# Patient Record
Sex: Male | Born: 1965 | Race: White | Hispanic: No | Marital: Married | Smoking: Never smoker
Health system: Southern US, Community
[De-identification: ages and names within clinical notes are randomized; demographics above are authoritative.]

## PROBLEM LIST (undated history)

## (undated) DIAGNOSIS — K5732 Diverticulitis of large intestine without perforation or abscess without bleeding: Secondary | ICD-10-CM

## (undated) DIAGNOSIS — M5410 Radiculopathy, site unspecified: Secondary | ICD-10-CM

## (undated) DIAGNOSIS — I10 Essential (primary) hypertension: Secondary | ICD-10-CM

## (undated) DIAGNOSIS — M5412 Radiculopathy, cervical region: Secondary | ICD-10-CM

## (undated) DIAGNOSIS — K2970 Gastritis, unspecified, without bleeding: Secondary | ICD-10-CM

## (undated) DIAGNOSIS — E785 Hyperlipidemia, unspecified: Secondary | ICD-10-CM

## (undated) DIAGNOSIS — M546 Pain in thoracic spine: Secondary | ICD-10-CM

## (undated) DIAGNOSIS — IMO0001 Reserved for inherently not codable concepts without codable children: Secondary | ICD-10-CM

## (undated) DIAGNOSIS — R002 Palpitations: Secondary | ICD-10-CM

## (undated) DIAGNOSIS — I4430 Unspecified atrioventricular block: Secondary | ICD-10-CM

## (undated) DIAGNOSIS — G4730 Sleep apnea, unspecified: Secondary | ICD-10-CM

## (undated) DIAGNOSIS — E119 Type 2 diabetes mellitus without complications: Secondary | ICD-10-CM

## (undated) HISTORY — DX: Radiculopathy, cervical region: M54.12

## (undated) HISTORY — DX: Gastritis, unspecified, without bleeding: K29.70

## (undated) HISTORY — DX: Morbid (severe) obesity due to excess calories: E66.01

## (undated) HISTORY — DX: Pain in thoracic spine: M54.6

## (undated) HISTORY — PX: CERVICAL DISCECTOMY: SHX98

## (undated) HISTORY — DX: Reserved for inherently not codable concepts without codable children: IMO0001

## (undated) HISTORY — DX: Essential (primary) hypertension: I10

## (undated) HISTORY — DX: Diverticulitis of large intestine without perforation or abscess without bleeding: K57.32

## (undated) HISTORY — DX: Palpitations: R00.2

## (undated) HISTORY — DX: Radiculopathy, site unspecified: M54.10

## (undated) HISTORY — DX: Unspecified atrioventricular block: I44.30

## (undated) HISTORY — DX: Sleep apnea, unspecified: G47.30

## (undated) HISTORY — DX: Hyperlipidemia, unspecified: E78.5

---

## 2002-09-23 DIAGNOSIS — IMO0001 Reserved for inherently not codable concepts without codable children: Secondary | ICD-10-CM

## 2002-09-23 HISTORY — DX: Reserved for inherently not codable concepts without codable children: IMO0001

## 2002-11-16 ENCOUNTER — Inpatient Hospital Stay (HOSPITAL_COMMUNITY): Admission: EM | Admit: 2002-11-16 | Discharge: 2002-11-18 | Payer: Self-pay | Admitting: *Deleted

## 2002-11-16 ENCOUNTER — Encounter: Payer: Self-pay | Admitting: Emergency Medicine

## 2002-11-17 ENCOUNTER — Encounter: Payer: Self-pay | Admitting: Cardiology

## 2002-11-17 HISTORY — PX: CARDIAC CATHETERIZATION: SHX172

## 2002-11-18 ENCOUNTER — Encounter: Payer: Self-pay | Admitting: Cardiology

## 2005-12-06 ENCOUNTER — Encounter: Admission: RE | Admit: 2005-12-06 | Discharge: 2005-12-06 | Payer: Self-pay | Admitting: Gastroenterology

## 2005-12-10 ENCOUNTER — Encounter: Admission: RE | Admit: 2005-12-10 | Discharge: 2005-12-10 | Payer: Self-pay | Admitting: Gastroenterology

## 2006-02-04 ENCOUNTER — Emergency Department (HOSPITAL_COMMUNITY): Admission: EM | Admit: 2006-02-04 | Discharge: 2006-02-04 | Payer: Self-pay | Admitting: Emergency Medicine

## 2006-04-15 ENCOUNTER — Ambulatory Visit (HOSPITAL_COMMUNITY): Admission: RE | Admit: 2006-04-15 | Discharge: 2006-04-15 | Payer: Self-pay | Admitting: Neurology

## 2006-04-27 ENCOUNTER — Ambulatory Visit (HOSPITAL_BASED_OUTPATIENT_CLINIC_OR_DEPARTMENT_OTHER): Admission: RE | Admit: 2006-04-27 | Discharge: 2006-04-27 | Payer: Self-pay | Admitting: Neurology

## 2006-05-04 ENCOUNTER — Ambulatory Visit: Payer: Self-pay | Admitting: Internal Medicine

## 2006-05-30 ENCOUNTER — Ambulatory Visit: Payer: Self-pay | Admitting: Pulmonary Disease

## 2006-06-04 ENCOUNTER — Ambulatory Visit: Payer: Self-pay | Admitting: Pulmonary Disease

## 2006-06-04 ENCOUNTER — Ambulatory Visit (HOSPITAL_BASED_OUTPATIENT_CLINIC_OR_DEPARTMENT_OTHER): Admission: RE | Admit: 2006-06-04 | Discharge: 2006-06-04 | Payer: Self-pay | Admitting: Pulmonary Disease

## 2006-07-11 ENCOUNTER — Encounter: Payer: Self-pay | Admitting: Vascular Surgery

## 2006-07-11 ENCOUNTER — Ambulatory Visit (HOSPITAL_COMMUNITY): Admission: RE | Admit: 2006-07-11 | Discharge: 2006-07-11 | Payer: Self-pay | Admitting: Pulmonary Disease

## 2006-07-11 ENCOUNTER — Ambulatory Visit: Payer: Self-pay | Admitting: Pulmonary Disease

## 2009-05-19 ENCOUNTER — Encounter: Payer: Self-pay | Admitting: Pulmonary Disease

## 2009-05-19 DIAGNOSIS — J330 Polyp of nasal cavity: Secondary | ICD-10-CM | POA: Insufficient documentation

## 2009-05-19 DIAGNOSIS — G510 Bell's palsy: Secondary | ICD-10-CM | POA: Insufficient documentation

## 2009-05-19 DIAGNOSIS — G4733 Obstructive sleep apnea (adult) (pediatric): Secondary | ICD-10-CM | POA: Insufficient documentation

## 2009-05-19 DIAGNOSIS — Z87898 Personal history of other specified conditions: Secondary | ICD-10-CM | POA: Insufficient documentation

## 2009-05-30 ENCOUNTER — Encounter: Admission: RE | Admit: 2009-05-30 | Discharge: 2009-05-30 | Payer: Self-pay | Admitting: Cardiology

## 2009-06-06 ENCOUNTER — Ambulatory Visit: Payer: Self-pay | Admitting: Pulmonary Disease

## 2009-06-21 ENCOUNTER — Encounter: Payer: Self-pay | Admitting: Pulmonary Disease

## 2009-10-31 ENCOUNTER — Encounter: Admission: RE | Admit: 2009-10-31 | Discharge: 2009-10-31 | Payer: Self-pay | Admitting: Gastroenterology

## 2010-10-23 NOTE — Miscellaneous (Signed)
Summary: CPAP download  Clinical Lists Changes CPAP 8 cm.  AHI 4.  Average 7hrs per night with 100% compliance.  Will have my nurse call to inform patient that CPAP download looked good, and no change to set up needed.  Appended Document: CPAP download LMTCB.  Appended Document: CPAP download Pt is aware of CPAP download and that no change is being made to his current set up.

## 2010-10-23 NOTE — Assessment & Plan Note (Signed)
Summary: rov//mbw   Primary Provider/Referring Provider:  C. Duane Lope  CC:  OSA follow-up.   Pt says he is still doing well on CPAP. Last seen in 2007.Marland Kitchen  History of Present Illness: I saw [name] in follow up for his OSA on CPAP 8 cm.  I last saw Mr. Garate in 2007.  He has a full face mask with humidifer.  He goes to bed at 1am, and wakes up at 8 am.  He is not having any problems with his mask.  He has not had any new equipment since his original set up in 2007.  He does not recall which DME company he used.  He does not snore when he uses CPAP.  He feels better energy during the day when he uses his CPAP.  He has been exercising, and try to watch his diet.  Current Medications (verified): 1)  Minocycline Hcl 100 Mg Tabs (Minocycline Hcl) .Marland Kitchen.. 1 By Mouth Two Times A Day 2)  Kapidex .Marland KitchenMarland Kitchen. 1 By Mouth Daily  Allergies (verified): 1)  ! Pcn 2)  ! Hydrocodone  Past History:  Past Medical History: Current Problems:  Complex sleep apnea      - PSG 04/27/06 AHI 73      - CPAP 8 cm H2O Bell's palsy Migraine headaches Nasal polyps GERD  Vital Signs:  Patient profile:   45 year old male Height:      68 inches (172.72 cm) Weight:      270 pounds (122.73 kg) BMI:     41.20 O2 Sat:      98 % on Room air Temp:     97.9 degrees F (36.61 degrees C) oral Pulse rate:   63 / minute BP sitting:   130 / 80  (right arm) Cuff size:   regular  Vitals Entered By: Michel Bickers CMA (June 06, 2009 11:56 AM)  O2 Flow:  Room air  Physical Exam  General:  healthy appearing and obese.   Nose:  no deformity, discharge, inflammation, or lesions Mouth:  MP 4, 2+ tonsills, enlarged tongue Neck:  no JVD.   Lungs:  clear bilaterally to auscultation and percussion Heart:  regular rate and rhythm, S1, S2 without murmurs, rubs, gallops, or clicks Abdomen:  bowel sounds positive; abdomen soft and non-tender without masses, or organomegaly Extremities:  no clubbing, cyanosis, edema, or  deformity noted Cervical Nodes:  no significant adenopathy   Impression & Recommendations:  Problem # 1:  OBSTRUCTIVE SLEEP APNEA (ICD-327.23) He has done well with CPAP.  I will re-establish him with a DME company to get new supplies.  I will also get a copy of his CPAP download.  I have encouraged him to continue his weight loss regimen.  Medications Added to Medication List This Visit: 1)  Minocycline Hcl 100 Mg Tabs (Minocycline hcl) .Marland Kitchen.. 1 by mouth two times a day 2)  Kapidex  .Marland KitchenMarland Kitchen. 1 by mouth daily  Complete Medication List: 1)  Minocycline Hcl 100 Mg Tabs (Minocycline hcl) .Marland Kitchen.. 1 by mouth two times a day 2)  Kapidex  .Marland KitchenMarland Kitchen. 1 by mouth daily  Other Orders: Est. Patient Level III (29562) DME Referral (DME)  Patient Instructions: 1)  Follow up in 6 months

## 2011-02-08 NOTE — Procedures (Signed)
NAME:  Robert Schultz, Robert Schultz NO.:  [id number]   MEDICAL RECORD NO.:  [id number]          PATIENT TYPE:  OUT   LOCATION:  SLEEP CENTER                 FACILITY[facility]  [facility]   PHYSICIAN:  Clinton D. Maple Hudson, M.D. DATE OF BIRTH:  1966-02-05   DATE OF STUDY:  04/27/2006                              NOCTURNAL POLYSOMNOGRAM   REFERRING PHYSICIAN:  Rene Kocher, M.D.   DATE OF STUDY:  April 27, 2006.   INDICATION FOR STUDY:  Hypersomnia with sleep apnea.   EPWORTH SLEEPINESS SCORE:  9/24, BMI 37.8, weight 250 pounds.   MEDICATIONS:  Minocycline, verapamil.   Complained of persistent dizziness.  A split protocol study was requested.   SLEEP ARCHITECTURE:  Short total sleep time 217 minutes, with sleep  efficiency 51%.  Stage I was 11%, stage II 85%, stages III and IV 1%, REM 4%  of total sleep time.  Sleep latency 71 minutes, REM latency 248 minutes,  awake after sleep onset 141 minutes.  Lights out at 10:08 p.m., sleep onset  11:19 p.m.Marland Kitchen  There were protracted intervals of wakefulness during the  night.  No bedtime medication was reported.  The patient described sleep as  short, fragmented, and worse than usual, without citing a specific source of  discomfort.   RESPIRATORY DATA:  Apnea/hypopnea index (AHI, RDI) 72.8 obstructive events  per hour, indicating severe obstructive sleep apnea/hypopnea syndrome.  There were 108 central apneas, 80 obstructive apneas, and 76 hypopneas.  Events were not positional.  Position changes were frequent.  REM AHI 112.5  per hour.  There was insufficient sustained sleep at the beginning of the  night to permit time for CPAP titration by split protocol on this study  night.   OXYGEN DATA:  Very loud snoring, with oxygen desaturation to a nadir of 85%.  Mean oxygen saturation through the study was 95% on room air.   CARDIAC DATA:  Normal sinus rhythm.   MOVEMENT-PARASOMNIA:  A total of 83 limb jerks were recorded, of which only  4 were  associated with arousal or awakening, for periodic limb movement with  arousal index of 1.1 per hour, which is probably not significant.  Bathroom  x3 is noteworthy, especially for a male of this age.   IMPRESSION-RECOMMENDATIONS:  1. Short sleep time, with sustained intervals of wakefulness during the      night.  Correlate with home sleep pattern, although the patient says      this was worse than usual.  2. Severe central and obstructive sleep apnea/hypopnea syndrome, AHI 72.8      respiratory events per hour.  While some central events may have      actually been scorable as obstructive, frequency of central events in      this range raises the possibility of an abnormal feedback loop as is      seen with cardiac or cerebrovascular perfusion disorders.  3. The initial therapeutic recommendation would be to treat the      obstructive apnea component which may also improve the central apnea      component.  Suggest that the patient return for CPAP titration if  otherwise clinically appropriate, possibly bringing a sleep medication      to aid in sleep consolidation for that purpose.  4. Nocturia x3 would be consistent with obstructive sleep apnea and a      variety of other lifestyle and medical disturbances, but would be      otherwise unusual if it is typical of this 45 year old individual.  5. Feel free to contact this interpreter if needed.      Clinton D. Maple Hudson, M.D.  Diplomate, Biomedical engineer of Sleep Medicine  Electronically Signed     CDY/MEDQ  D:  05/04/2006 09:30:12  T:  05/05/2006 09:46:13  Job:  [id number]

## 2011-02-08 NOTE — Discharge Summary (Signed)
NAME:  Robert Schultz, Robert Schultz NO.:  [id number]   MEDICAL RECORD NO.:  [id number]                   PATIENT TYPE:  INP   LOCATION:  3707                                 FACILITY:  [facility]   PHYSICIAN:  Colleen Can. Deborah Chalk, M.D.            DATE OF BIRTH:  Aug 27, 1966   DATE OF ADMISSION:  11/16/2002  DATE OF DISCHARGE:  11/18/2002                                 DISCHARGE SUMMARY   PRIMARY DIAGNOSES:  1. AV block with bradycardia in the setting of nausea and diaphoresis with     subsequent elective cardiac catheterization revealing normal coronary     arteries and normal LV function.  2. Obesity.   HISTORY OF PRESENT ILLNESS:  The patient is a 45 year old married white male  who presented to the hospital on 11/16/02.  On the day of admission, at  lunch, he basically felt somewhat full but then developed dizziness.  He  felt somewhat sleepy with mild nausea and decided to go home.  At home, he  slept for about 10-15 minutes on the couch but then felt lightheaded after  urination.  His stomach continued to feel full and he had a diaphoretic  sensation with dizziness and mild shortness of breath.  There was no real  chest pain syndrome.  He repeated this sequence over the next 3-4 hours with  at least 2-3 more episodes.  His wife came home and advised him to seek  further evaluation, so he presented to the emergency room where he had  another episode that was associated with profound bradycardia and AV block.  There was no abnormal ST elevation on EKG with no chest pain.  He was  subsequently seen and evaluated by Dr. Roger Shelter and admitted to  telemetry.   Please see dictated History and Physical for further patient presentation  and profile.   LABORATORY DATA:  EKG showed sinus rhythm with AV block.  Chest x-ray showed  mild elevation of the right hemidiaphragm, otherwise, unremarkable.  CBC  showed white count 9, hemoglobin 16, hematocrit 45, platelets  183,000.  PT/PTT were unremarkable.  Chemistries were satisfactory except for glucose  of 123, ALT 51.  Cardiac enzymes were negative x5.  TSH was normal at 1.785.  Urinalysis was unremarkable.   PROCEDURE:  1. Cardiac catheterization with results as noted above.  2. Abdominal ultrasound with probable diffuse fatty infiltration of the     liver with normal sonographic appearance of the gallbladder and normal     caliber common bile duct.  There was nonvisualization of the pancreas.  3. Hepatobiliary scan which was normal.   HOSPITAL COURSE:  The patient was admitted to telemetry.  We proceeded on  with cardiac catheterization to rule out coronary disease as the etiology of  his profound bradycardia and AV block.  Those results are as noted above.  He subsequently had GI consultation carried out by Dr.  Boyd Kerbs.  It was  felt that the differential diagnosis consisted of acalculous cholecystitis,  pancreatitis or an acute viral gastroenteritis.  The patient was placed on  Protonix.  Hepatobiliary scan was performed after abdominal ultrasound.  Those results are as noted above.  The patient was subsequently discharged  following hepatobiliary scan with further evaluation to occur on an  outpatient basis.   CONDITION ON DISCHARGE:  Stable.   DISCHARGE MEDICATIONS:  Protonix 40 mg daily.   FOLLOWUP:  Our office will call for a follow up time for a follow up  appointment.  He is not to drive until seen back following 2D echocardiogram  results.  He is to call if any problems would occur in the interim.     Juanell Fairly C. Earl Gala, N.P.                 Colleen Can. Deborah Chalk, M.D.    LCO/MEDQ  D:  12/16/2002  T:  12/17/2002  Job:  [id number]   cc:   Tasia Catchings, M.D.  [address]  [address]  [place]  [place] [address]  Fax: [phone]   Dellis Anes. Idell Pickles, M.D.  [address]  [place]  [place] [address]  Fax: [phone]

## 2011-02-08 NOTE — H&P (Signed)
NAME:  Robert Schultz, CALLOW NO.:  [id number]   MEDICAL RECORD NO.:  [id number]                   PATIENT TYPE:  EMS   LOCATION:  MAJO                                 FACILITY:  [facility]   PHYSICIAN:  Colleen Can. Deborah Chalk, M.D.            DATE OF BIRTH:  [birth year]   DATE OF ADMISSION:  11/16/2002  DATE OF DISCHARGE:                                HISTORY & PHYSICAL   HISTORY OF PRESENT ILLNESS:  Mr. Kossman is a 45 year old married white male  with no children, no alcohol, no tobacco, who works in Teaching laboratory technician.   HISTORY OF PRESENT ILLNESS:  Today at lunch he basically felt full, but then  he developed a dizziness.  He was somewhat sleepy and felt mild nausea and  decided to go home.  This was approximately 2 p.m. today.  At home, he was  tired, slept 15-20 minutes on the couch, but then he felt lightheaded after  urination.  His stomach still felt full, and he began to have a diaphoretic  sensation with dizziness, mild shortness of breath.  There was no chest  pain.  He had a sweat and felt better.  He repeated this sequence over the  next 3-4 hours at least 2-3 more times.  When his wife came home, he was  brought to the emergency room.  He had another episode in the emergency room  associated with profound bradycardia and AV block.  There was no chest pain  associated with that, no ST elevation noted with that.   PAST MEDICAL HISTORY:  He has generally been in good health.   PAST SURGICAL HISTORY:  No surgeries.   ALLERGIES:  No allergies.   MEDICATIONS:  1. Minocycline for acne.  2. He takes a nasal spray for congestion.  3. Over-the-counter Sudafed.  4. Ibuprofen.   FAMILY HISTORY:  Father is alive at age 80 in good health.  Mother is alive  at age 93.  She has MS.  One brother and one sister are alive and well.   REVIEW OF SYSTEMS:  His eyelids twitch on the right.  He has had no tick  bite, no fever, no rash, although his  right ankle has been a little bit red;  it is clearly not a rash currently.  PULMONARY:  Negative.  GI:  Negative.  GU:  Negative.  NEUROLOGIC:  He has had syncope as a child after taking cold  medicine 7-8 years ago.  He had a vague chest pain, was dehydrated, went to  [facility], and it was felt to be secondary to dehydration.  He has not had  recurrence since then.   PHYSICAL EXAMINATION:  GENERAL:  He is a pleasant obese white male.  He has  a ruddy complexion.  VITAL SIGNS:  Blood pressure 138/72, heart rate in the 90s to 100s, but has  had on telemetry  down as low as 35.  HEENT:  Negative.  LUNGS:  Clear.  HEART:  Regular rate and rhythm with some mild tachycardia.  ABDOMEN:  Obese.  EXTREMITIES:  Without edema.   His EKG shows sinus with an AV block.  There are no acute ST changes noted.   OVERALL IMPRESSION:  1. AV block with bradycardia.  2. Nausea and diaphoresis.   PLAN:  Will admit the patient to telemetry.  I think he needs to have a  cardiac catheterization, in particular to rule out an ischemic basis of all  this heart block.  Will arrange for IV heparin and Nitrol paste.                                               Colleen Can. Deborah Chalk, M.D.    SNT/MEDQ  D:  11/16/2002  T:  11/17/2002  Job:  [id number]

## 2011-02-08 NOTE — Assessment & Plan Note (Signed)
[facility]                               PULMONARY OFFICE NOTE   NAME:JOHNSONEdiel, Unangst                       MRN:          [id number]  DATE:07/11/2006                            DOB:          Dec 28, 1965    I reviewed the results of the Doppler ultrasound of the right leg for Mr.  Degrace and this was essentially negative for DVT, superficial thrombosis or  Baker's cyst, and therefore I would recommend just clinical monitoring of  his symptoms.       Coralyn Helling, MD      VS/MedQ  DD:  07/15/2006  DT:  07/16/2006  Job #:  [id number]   cc:   Rene Kocher, M.D.

## 2011-02-08 NOTE — Assessment & Plan Note (Signed)
[facility]                               PULMONARY OFFICE NOTE   NAME:JOHNSONZhion, Pevehouse                       MRN:          [id number]  DATE:07/11/2006                            DOB:          28-May-1966    I saw Mr. Menter in followup today for his obstructive sleep apnea.  He is  currently on CPAP at 8 and he appears to be doing reasonably well.  He says  that he is able to sleep much better through the night.  His wife does not  hear him snoring any more.  He says it was a little bit difficult at first  to get used to using the machine, and he has had difficulty as far as  adjusting to the mask fitting over the bridge of his nose.  Otherwise, he  says he is feeling like he has more energy during the day and has had  several people comment with regards to that.  His other concern is that he  had recently returned from a plane flight to [place] and, after this, he was  developing pain and swelling in his right ankle.  He says that the swelling  has improved somewhat, but it still present.  He, however, denies any  symptoms of shortness of breath, chest pain, palpitations, coughing,  wheezing, dizziness, or nausea.   EXAM:  He had very minimal swelling around the right ankle area, but there  is no tenderness, erythema, and there are no visible or palpable vessels.   IMPRESSION:  1. Obstructive sleep apnea, currently doing well on continuous positive      airway pressure of 8 cm of water.  I have advised him to contact his      home care company to see if he can try an alternative mask to see if      this improves his tolerance of continuous positive airway pressure      therapy.  Otherwise, I have encouraged him to maintain his diet,      exercise, and weight reduction program.  Of note, also, is that he      informs me that he had a recent cardiology evaluation and, as per the      patient, this was essentially normal.  2. Minimal ankle swelling  with recent plane flight.  The patient had      stated that he had done some investigation on the Internet and was      concerned if he had developed a clot in his leg.  While certainly on      physical findings this is less likely, to further evaluate this, I have      made arrangements for him to refer to [facility] radiology department      for a Doppler study of his right leg to evaluate for the possibility of      a deep vein thrombosis.  I will call him with the results of this once      they are available and I have advised him that, if these results are  positive, then he would need to be initiated on anticoagulation      therapy.  Otherwise, I will      plan on following up with him in approximately 4 months for further      assessment of the status of his obstructive sleep apnea.       Coralyn Helling, MD   VS/MedQ DD:  07/14/2006 DT:  07/15/2006 Job #:  [id number]   cc:   Rene Kocher, M.D.

## 2011-02-08 NOTE — Cardiovascular Report (Signed)
NAME:  Robert Schultz, Robert Schultz NO.:  [id number]   MEDICAL RECORD NO.:  [id number]                   PATIENT TYPE:  INP   LOCATION:  3707                                 FACILITY:  [facility]   PHYSICIAN:  Colleen Can. Deborah Chalk, M.D.            DATE OF BIRTH:  [birth year]   DATE OF PROCEDURE:  11/17/2002  DATE OF DISCHARGE:                              CARDIAC CATHETERIZATION   HISTORY:  The patient presents with intermittent high-grade AV block with  profound bradycardia with diaphoresis, shortness of breath, and nausea.  He  is referred for catheterization.  Myocardial infarction had been ruled out.   PROCEDURES:  Left heart catheterization with selective coronary angiography,  left ventricular angiography.   TYPE AND SITE OF ENTRY:  Percutaneous right femoral artery.   CATHETERS:  A 6 French 4 curved Judkins left coronary catheters, 6 French  pigtail ventriculographic catheter, Perclose device.   MEDICATIONS GIVEN PRIOR TO THE PROCEDURE:  Valium 10 mg p.o.   MEDICATIONS GIVEN DURING THE PROCEDURE:  Versed 2 mg IV.   COMMENTS:  The patient tolerated the procedure well.   HEMODYNAMIC DATA:  The aortic pressure was 111/81, LV was 112/11-17.  There  was no aortic valve gradient noted on pullback.   LEFT VENTRICULOGRAPHY:  The left ventricular angiogram was performed in the  RAO position.  Overall cardiac size and silhouette are normal.  The global  left ventricular function is normal with ejection fraction of 65%.  Regional  wall motion is normal.   CORONARY ARTERIES:  Coronary arteries arise and distribute normally.  1. Left main coronary artery:  Normal.  2. Left anterior descending:  The left anterior descending is a reasonably     large system that extends to around the apex.  There is a high proximal     diagonal that is relatively large.  The left anterior descending system     is normal.  3. Left circumflex:  The left circumflex continues as a  posterolateral     branch that trifurcates.  There are smaller proximal marginal vessels.     There are minor irregularities but essentially the arteries are normal.  4. Right coronary artery:  The right coronary artery is a dominant vessel.     It has a large posterior descending.  It is normal.   OVERALL IMPRESSION:  1. Normal left ventricular function.  2. Normal coronary arteries.    DISCUSSION:  It does not appear that coronary artery disease is the etiology  of this intermittent nausea with complete heart block.  His gallbladder  ultrasound is basically unremarkable.  At his age of 55 we will review other  etiologies before considering pacing.  Colleen Can. Deborah Chalk, M.D.    SNT/MEDQ  D:  11/17/2002  T:  11/17/2002  Job:  [id number]

## 2011-02-08 NOTE — Procedures (Signed)
EEG No. [id number].   CLINICAL HISTORY:  The patient is a 45 year old right-handed gentleman who  had episodes of dizziness 20-30 times a day lasting seconds.  This began in  March 2007.  Has occasional staring spells lasting 3-4 seconds.  EEG is done  to look for the presence of seizures, 780.02.   PROCEDURE:  The tracing is carried out on a 32-channel digital Cadwell  recorder reformatted into 16 channel montages with one devoted to EKG.  The  patient was awake during the recording.  The International 10/20 system of  lead placement used.  Medications include Avelox and minocycline.   DESCRIPTION OF FINDINGS:  Dominant frequency 10 Hz, 15 microvolt activity  that is well-regulated.  Background activity is a 10-15 Hz upper theta range  activity with frontally predominant under 10 microvolt beta range  components.   Hyperventilation caused no significant change.  Photic stimulation induced  driving responses at 7, 9 and 11 Hz.  There was no focal slowing.  There was  no interictal epileptiform activity form of spikes or sharp waves.   EKG showed a regular sinus rhythm with ventricular response of 66 beats per  minute.   IMPRESSION:  Essentially normal record awake (low voltage background), no  sign of seizures.      Deanna Artis. Sharene Skeans, M.D.  Electronically Signed     ZOX:WRUE  D:  04/15/2006 19:00:09  T:  04/16/2006 06:52:34  Job #:  [id number]   cc:   Rene Kocher, M.D.  Fax: [phone]

## 2011-02-08 NOTE — Consult Note (Signed)
NAME:  Robert Schultz, Robert Schultz NO.:  [id number]   MEDICAL RECORD NO.:  [id number]                   PATIENT TYPE:  INP   LOCATION:  3707                                 FACILITY:  [facility]   PHYSICIAN:  Tasia Catchings, M.D.                DATE OF BIRTH:  1965-10-28   DATE OF CONSULTATION:  11/17/2002  DATE OF DISCHARGE:                                   CONSULTATION   REASON FOR CONSULTATION:  The patient is a 45 year old male admitted  yesterday with an illness characterized by nausea, some mild epigastric and  right upper quadrant pain, lightheadedness, chills, fever, and during his  episodes of nausea second degree AV block.  His underlying history that he  has been pretty good health and really on no medications.  However, he is  excessively overweight.  For that reason, when he was admitted and found to  have an AV block, he was first sent to Dr. Colleen Can. Deborah Chalk who quite  reasonably ruled out a MI and then performed a cardiac catheterization which  was normal.   Since all of those tests were normal and it appears that his arrhythmia, at  least in part, might be due to vasovagal reaction.  I was called to see him.   He still complains of a little bit of epigastric fullness, although it has  been better since he vomited.  He apparently vomited several times  throughout the night, mostly what he had eaten at noon yesterday and then  finally some bile and has not vomited since about 8 a.m. this morning.  However, his normal good appetite has not returned and he is still feeling  queasy.   He has no antecedent history of gastrointestinal problems and rarely does he  vomit.  He is not on any antiacid medications but does use occasional  ibuprofen for joint pains.   ALLERGIES:  None.   HABITS:  Smoking:  None.  Alcohol:  None.   CURRENT MEDICATIONS:  Occasional ibuprofen.   PAST SURGICAL HISTORY:  None.   PAST MEDICAL HISTORY:  He has a fractured  right wrist.   FAMILY HISTORY:  Father is alive age 54 with prostate cancer.  Mother is  alive at age 21 with multiple sclerosis.  He has one brother and one sister  who are in good health and no children.   SOCIAL HISTORY:  The patient is a [employer] [place] operational [employer] native.  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   PHYSICAL EXAMINATION:  GENERAL:  He is an obese male who appears somewhat  apprehensive.  ABDOMEN:  His abdomen is somewhat difficult to examine due to obesity but it  is quite tender on the right upper quadrant. I cannot feel liver or spleen.  Bowel sounds are normal.  There are no bruits but there was definite  tenderness in the right upper quadrant.   LABORATORY DATA:  A normal CBC and CMET.  Amylase and lipase have not been  obtained.   Abdominal ultrasound done today showed no gallstones and was essentially  normal but a limited study due to body habitus.  It did show a fatty liver.   IMPRESSION:  1. Abdominal pain, nausea, and vomiting of uncertain etiology.  Acalculous     cholecystitis, pancreatitis, and acute viral gastrointestinal are all     possible.  2. 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   RECOMMENDATIONS:  1. Agree with Protonix which was started today.  2. I have added an amylase and lipase tonight's blood.  3. Hepatobiliary scan in the morning.                                               Tasia Catchings, M.D.    JW/MEDQ  D:  11/17/2002  T:  11/17/2002  Job:  [id number]   cc:   Colleen Can. Deborah Chalk, M.D.  [address]  [place]  [place] [address]  Fax: [phone]

## 2011-02-08 NOTE — Assessment & Plan Note (Signed)
[facility]                               PULMONARY OFFICE NOTE   Robert Schultz, Robert Schultz                       MRN:          [id number]  DATE:05/30/2006                            DOB:          1966-04-05    REFERRING PHYSICIAN:  Rene Kocher, M.D.   I had the pleasure of meeting Robert Schultz today for evaluation of his sleep  difficulties.  He had undergone an overnight polysomnogram on April 27, 2006, and this showed evidence for severe mixed sleep apnea with both  central and obstructive events.  His overall apneahypopnea index was 72.8  with an oxygen saturation nadir of 85%.  He also had a slight increase in  his periodic limb movement index.  His total central apneahypopnea index was  29.8 and his total obstructive apneahypopnea index was 22.1.  He says that  he was originally referred for his sleep study because he has been having  symptoms of dizziness since March 2007.  He apparently had undergone  evaluation with an EEG and an MRI which were both normal, as well as an EKG  which was normal per the patient.  He says he is due to have a cardiology  evaluation within the next several weeks.  He has also been told that he  snores quite loudly and actually stops breathing while he is asleep.  He  tends to be more of a mouth-breather at night and he will wake up once or  twice during the night to use the bathroom.  He says that his typical sleep  pattern is he will go to bed between 11 and 1:30 in the morning.  He falls  asleep fairly quickly.  He wakes up once or twice during the night again to  use the bathroom and then he gets up between 7:30 and 8:30 in the morning.  He says he will feel tired at times in the morning and at times feel tired  during the day.  His Epworth score today is 8 out of 24.  He has fallen  asleep while watching TV at times, but denies having any difficulty with  feeling sleepy while driving.  There is no history of  nightmares, night  terrors, sleep walking, sleep talking or bruxism.  He also denies restless  leg  syndrome symptoms.  He denies symptoms of sleep hallucination, sleep  paralysis or cataplexy.  He is not currently using anything to help him fall  asleep at night or stay away during the day.   PAST MEDICAL HISTORY:  1. Possible migraines and he has maxillary sinus polyposis.  2. He also has a history of Bell palsy twice for which he was treated with      prednisone.   CURRENT MEDICATIONS:  1. Verapamil 120 mg daily for his migraines.  2. He is on Flonase 2 sprays in each nostril daily.   ALLERGIES:  HE HAS ALLERGIES TO HYDROCODONE WHICH CAUSED HIM TO DEVELOP  SWEATS AND PENICILLIN WHICH CAUSED HIM TO DEVELOP A RASH.   FAMILY HISTORY:  His father who  has hypertension, basal cell carcinoma and  possible sleep apnea.  His mother has multiple sclerosis.   SOCIAL HISTORY:  He is married.  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  There is no history of smoking or alcohol use.   REVIEW OF SYSTEMS:  He says he will clench his teeth at times at night.  He  has lost approximately 25 pounds over the last several months.  He says he  has been exercising by riding a bicycle as well as jogging.  Of note, is  that his father had noticed that his snoring was somewhat less with this  recent weight loss.   PHYSICAL EXAMINATION:  VITAL SIGNS:  He is 5 feet 8 inches tall, 256 pounds,  temperature 98.2, blood pressure 126/84, heart rate 74, oxygen saturation  97% on room air.  HEENT:  Pupils are reactive.  There is no sinus tenderness.  He has a narrow  nasal angle.  He has a Mallampati 4 airway with an enlarged tongue, 2+  tonsils and a triangular-shaped uvula with oropharyngeal crowding.  There is  no lymphadenopathy, no thyromegaly.  HEART:  S1, S2.  Regular rhythm.  CHEST:  Clear to auscultation.  ABDOMEN:  Obese, soft, nontender.  EXTREMITIES:  No edema, cyanosis or  clubbing.  NEUROLOGIC:  He was alert and oriented x3, 5/5 strength and no cerebellar  deficits were appreciated.   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  I reviewed the results of his sleep study with him.  I described the  adverse consequences of obstructive sleep apnea including increased risks of  hypertension, coronary artery disease, cerebrovascular disease and diabetes.  I had also discussed with him the implications of having central sleep apnea  as this may be associated with either cardiac disease or neurologic disease.  However, it appears that he has had a rather extensive evaluation per his  neurologic status which, according to the patient, is essentially negative,  and he is due to have further cardiac evaluation.  Again, the central apneic  event could also present a form of obstructive sleep apnea as well.  What I  have recommended for the patient at this time is to maintain his diet,  exercise and weight reduction program.  I have discussed driving precautions  with him until his sleep disorder is under adequate control.  I had reviewed  various treatment options for him with regards to his sleep apnea.  At this  time we feel his best option would be for him to initiate CPAP therapy.  Therefore, we will make arrangements for him to be referred back to sleep  lab for a CPAP titration study and then, depending upon his response to this  as well as review of the results of his sleep study, we will either initiate  him on CPAP therapy or consider other modes of therapy such as bilevel  positive airway pressure as well as possibly adaptive Servo ventilator.  Other measures that could be considered for his central sleep apnea include  supplemental oxygen as well as acetazolamide, but again this would be  determined after review of his CPAP titration study.  With regards to the increase in his periodic limb movement, I would want to see  his symptom  status after he his sleep apnea is adequately controlled, and then  assess whether any further intervention will be necessary for this.  I will  call him  with the results of his sleep study and plan on following up with  him in our office in about 6 to 8 weeks.                                  Coralyn Helling, MD   VS/MedQ  DD:  05/30/2006  DT:  05/31/2006  Job #:  [id number]   cc:   Rene Kocher, M.D.

## 2011-03-06 ENCOUNTER — Encounter: Payer: Self-pay | Admitting: Nurse Practitioner

## 2011-03-08 ENCOUNTER — Ambulatory Visit (INDEPENDENT_AMBULATORY_CARE_PROVIDER_SITE_OTHER): Payer: PRIVATE HEALTH INSURANCE | Admitting: Nurse Practitioner

## 2011-03-08 ENCOUNTER — Encounter: Payer: Self-pay | Admitting: Nurse Practitioner

## 2011-03-08 VITALS — BP 160/100 | HR 72 | Ht 68.0 in | Wt 263.8 lb

## 2011-03-08 DIAGNOSIS — E785 Hyperlipidemia, unspecified: Secondary | ICD-10-CM

## 2011-03-08 DIAGNOSIS — I10 Essential (primary) hypertension: Secondary | ICD-10-CM

## 2011-03-08 DIAGNOSIS — R002 Palpitations: Secondary | ICD-10-CM

## 2011-03-08 LAB — HEPATIC FUNCTION PANEL
ALT: 39 U/L (ref 0–53)
AST: 26 U/L (ref 0–37)
Albumin: 4.4 g/dL (ref 3.5–5.2)
Alkaline Phosphatase: 71 U/L (ref 39–117)
Bilirubin, Direct: 0.1 mg/dL (ref 0.0–0.3)
Total Bilirubin: 0.6 mg/dL (ref 0.3–1.2)
Total Protein: 7.7 g/dL (ref 6.0–8.3)

## 2011-03-08 LAB — BASIC METABOLIC PANEL
BUN: 16 mg/dL (ref 6–23)
CO2: 29 mEq/L (ref 19–32)
Calcium: 9.1 mg/dL (ref 8.4–10.5)
Chloride: 103 mEq/L (ref 96–112)
Creatinine, Ser: 0.8 mg/dL (ref 0.4–1.5)
GFR: 116.97 mL/min (ref >60.00)
Glucose, Bld: 127 mg/dL — ABNORMAL HIGH (ref 70–99)
Potassium: 4.4 mEq/L (ref 3.5–5.1)
Sodium: 138 mEq/L (ref 135–145)

## 2011-03-08 LAB — LIPID PANEL
Cholesterol: 223 mg/dL — ABNORMAL HIGH (ref 0–200)
HDL: 33.6 mg/dL — ABNORMAL LOW (ref >39.00)
Total CHOL/HDL Ratio: 7
Triglycerides: 230 mg/dL — ABNORMAL HIGH (ref 0.0–149.0)
VLDL: 46 mg/dL — ABNORMAL HIGH (ref 0.0–40.0)

## 2011-03-08 LAB — LDL CHOLESTEROL, DIRECT: Direct LDL: 163.6 mg/dL

## 2011-03-08 MED ORDER — LISINOPRIL 10 MG PO TABS: 10.0000 mg | ORAL_TABLET | Freq: Every day | ORAL | Status: DC

## 2011-03-08 MED ORDER — ASPIRIN EC 81 MG PO TBEC: 81.0000 mg | DELAYED_RELEASE_TABLET | Freq: Every day | ORAL | Status: DC

## 2011-03-08 NOTE — Assessment & Plan Note (Addendum)
Blood pressure is up. I suspect this has been going on for a while. He is on no medicines. I have started him on Lisinopril 10 mg along with a baby aspirin daily. Labs are checked today. I will see him back in 2 weeks. He is asked to obtain a blood pressure cuff at home and monitor. He is encouraged to continue with his efforts at diet and weight loss. He is down 5# since his last visit. Patient is agreeable to this plan and will call if any problems develop in the interim.   Phone call on June 20th. Not tolerating ACE. Has shortness of breath and fatigue. Feels bad. Will stop the ACE and try Norvasc 5 mg daily. He is to call for any other problems.

## 2011-03-08 NOTE — Assessment & Plan Note (Signed)
Labs will be checked today. 

## 2011-03-08 NOTE — Progress Notes (Signed)
    Robert Schultz Date of Birth: [birth year]   History of Present Illness: Robert Schultz is seen back today after an approximate 1 1/2 year absence. He is seen for Dr. Deborah Chalk. He will be followed by Dr. Mariah Milling. He feels good. No chest pain. No shortness of breath. He is on no medicines. He does not check his blood pressure at home. He is under a great deal of stress. 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 He is exercising without any problems.  Current Outpatient Prescriptions on File Prior to Visit  Medication Sig Dispense Refill  . DISCONTD: atenolol (TENORMIN) 25 MG tablet Take 12.5 mg by mouth 4 (four) times daily.          Allergies  Allergen Reactions  . Hydrocodone   . Penicillins     Past Medical History  Diagnosis Date  . Hyperlipidemia   . Morbid obesity   . Gastritis   . Palpitations   . AV block   . HTN (hypertension)     Past Surgical History  Procedure Date  . Cardiac catheterization 11/17/2002    NORMAL. EF 65%    History  Smoking status  . Never Smoker   Smokeless tobacco  . Never Used    History  Alcohol Use No    Family History  Problem Relation Age of Onset  . Multiple sclerosis Mother     Review of Systems: The review of systems is positive for situational stress. He has no complaint of palpitations and says this has gotten better with time. He is on no medicines.  All other systems were reviewed and are negative.  Physical Exam: BP 160/100  Pulse 72  Ht 5\' 8"  (1.727 m)  Wt 263 lb 12.8 oz (119.659 kg)  BMI 40.11 kg/m2 Patient is very pleasant and in no acute distress. He is obese. Skin is warm and dry. Color is normal.  HEENT is unremarkable. Normocephalic/atraumatic. PERRL. Sclera are nonicteric. Neck is supple. No masses. No JVD. Lungs are clear. Cardiac exam shows a regular rate and rhythm. Abdomen is soft. Extremities are without edema. Gait and ROM are intact. No gross neurologic deficits noted.  LABORATORY DATA: Pending  Assessment / Plan:

## 2011-03-08 NOTE — Assessment & Plan Note (Signed)
He is not having any palpitations at this time.

## 2011-03-08 NOTE — Patient Instructions (Signed)
Start Lisinopril 10 mg daily for your blood pressure. Take the first dose tonight and then one each morning Take an aspirin 81 mg daily We are going to check your labs today Get a blood pressure cuff and monitor your blood pressure at home. Omron is a good brand Record your readings and bring to your next visit. Limit sodium intake. Call for any problems. I will see you in 2 weeks.

## 2011-03-11 ENCOUNTER — Telehealth: Payer: Self-pay | Admitting: *Deleted

## 2011-03-11 NOTE — Telephone Encounter (Signed)
Message copied by Adolphus Birchwood on Mon Mar 11, 2011  4:53 PM ------      Message from: Rosalio Macadamia      Created: Fri Mar 08, 2011  4:28 PM       Please report. Glucose is up. Lipids are not good. Would advise statin therapy, strict diet (diabetic) and weight loss. Will discuss further on return visit.

## 2011-03-11 NOTE — Telephone Encounter (Signed)
Pt notified of lab results and was encourage to loss weight, start statin therapy, and to have a strict diabetic diet.  Pt will discuss with Lawson Fiscal at next visit before starting a statin drug.

## 2011-03-13 ENCOUNTER — Telehealth: Payer: Self-pay | Admitting: Cardiology

## 2011-03-13 DIAGNOSIS — I10 Essential (primary) hypertension: Secondary | ICD-10-CM

## 2011-03-13 MED ORDER — AMLODIPINE BESYLATE 5 MG PO TABS
5.0000 mg | ORAL_TABLET | Freq: Every day | ORAL | Status: DC
Start: 2011-03-13 — End: 2011-03-22

## 2011-03-13 NOTE — Telephone Encounter (Signed)
Would he try to cut the dose back to 5 mg daily?

## 2011-03-13 NOTE — Telephone Encounter (Deleted)
Pt.notified

## 2011-03-13 NOTE — Telephone Encounter (Signed)
Pt is c/o of "laboris" breathing, feeling swimmy headed, tired, and having to get up five times last night to urinate since starting the Lisinopril on Sunday.  Pt's BP today 132/84. Pt's BP on Saturday was 132/79 before starting Lisinopril.  Pt is very concerned about these new symptoms and would like to know what to do.

## 2011-03-13 NOTE — Telephone Encounter (Signed)
Pt will stop Lisinopril due to side effects and start Amlodipine 5 mg daily.  Pt will call back with any complaints.

## 2011-03-13 NOTE — Telephone Encounter (Signed)
Called wanting to report that the medications that were prescribed to him are not sitting well with him.  Says that he is swimmy headed, has labored breathing and extreme fatigue. Please call back. I have pulled his chart.

## 2011-03-22 ENCOUNTER — Encounter: Payer: Self-pay | Admitting: Nurse Practitioner

## 2011-03-22 ENCOUNTER — Ambulatory Visit (INDEPENDENT_AMBULATORY_CARE_PROVIDER_SITE_OTHER): Payer: PRIVATE HEALTH INSURANCE | Admitting: Nurse Practitioner

## 2011-03-22 VITALS — BP 128/90 | HR 75 | Wt 261.0 lb

## 2011-03-22 DIAGNOSIS — I10 Essential (primary) hypertension: Secondary | ICD-10-CM

## 2011-03-22 DIAGNOSIS — E785 Hyperlipidemia, unspecified: Secondary | ICD-10-CM

## 2011-03-22 DIAGNOSIS — R079 Chest pain, unspecified: Secondary | ICD-10-CM

## 2011-03-22 NOTE — Patient Instructions (Addendum)
I will give you 3 months to work on your diet/exercise/weight loss We will recheck your labs in 3 months. Call for any problems  May try some Meclizine (antivert) for your vertigo

## 2011-03-22 NOTE — Assessment & Plan Note (Signed)
He has good outpatient control. I have left him on no medicines. I will see back for ETT.

## 2011-03-22 NOTE — Progress Notes (Signed)
    Robert Schultz Date of Birth: 11-12-1965   History of Present Illness: Robert Schultz is seen back today for a 2 week check. He is seen for Dr. Elease Schultz. He is a former patient of Dr. Ronnald Nian. I tried to start him on Lisinopril for HTN. He did not tolerate it. He got very dizzy. He has been very vigilant with checking his blood pressure at home. He has very good control. He continues to exercise. He is wanting a stress test due to his multiple risk factors. We discussed his lipids. His sister, who is a Scientific laboratory technician, is willing to work with him. He does not want to go on statin.  Current Outpatient Prescriptions on File Prior to Visit  Medication Sig Dispense Refill  . DISCONTD: amLODipine (NORVASC) 5 MG tablet Take 1 tablet (5 mg total) by mouth daily.  30 tablet  6  . DISCONTD: aspirin EC 81 MG tablet Take 1 tablet (81 mg total) by mouth daily.  30 tablet  0    Allergies  Allergen Reactions  . Hydrocodone   . Penicillins     Past Medical History  Diagnosis Date  . Hyperlipidemia   . Morbid obesity   . Gastritis   . Palpitations   . AV block   . HTN (hypertension)   . Normal coronary angiogram 2004    Past Surgical History  Procedure Date  . Cardiac catheterization 11/17/2002    NORMAL. EF 65%    History  Smoking status  . Never Smoker   Smokeless tobacco  . Never Used    History  Alcohol Use No    Family History  Problem Relation Age of Onset  . Multiple sclerosis Mother     Review of Systems: The review of systems is positive for vertigo. No syncope. No actual chest pain.  All other systems were reviewed and are negative.  Physical Exam: BP 128/90  Pulse 75  Wt 261 lb (118.389 kg) Patient is very pleasant and in no acute distress. He is obese. Skin is warm and dry. Color is normal.  HEENT is unremarkable. Normocephalic/atraumatic. PERRL. Sclera are nonicteric. Neck is supple. No masses. No JVD. Lungs are clear. Cardiac exam shows a regular rate and rhythm. Abdomen  is soft and obese. Extremities are without edema. Gait and ROM are intact. No gross neurologic deficits noted.  LABORATORY DATA:   Assessment / Plan:

## 2011-03-22 NOTE — Assessment & Plan Note (Signed)
We will give him a 3 month trial of diet/exercise/weight loss. Recheck labs in 3 months with an A1C.

## 2011-04-03 ENCOUNTER — Encounter: Payer: Self-pay | Admitting: Nurse Practitioner

## 2011-04-03 NOTE — Progress Notes (Deleted)

## 2011-04-04 ENCOUNTER — Ambulatory Visit (INDEPENDENT_AMBULATORY_CARE_PROVIDER_SITE_OTHER): Payer: PRIVATE HEALTH INSURANCE | Admitting: Nurse Practitioner

## 2011-04-04 ENCOUNTER — Encounter: Payer: Self-pay | Admitting: Nurse Practitioner

## 2011-04-04 DIAGNOSIS — Z9189 Other specified personal risk factors, not elsewhere classified: Secondary | ICD-10-CM | POA: Insufficient documentation

## 2011-04-04 DIAGNOSIS — I10 Essential (primary) hypertension: Secondary | ICD-10-CM

## 2011-04-04 NOTE — Patient Instructions (Signed)
Your stress test looks good. I encourage you to exercise 45 to 60 minutes each day Continue to work on diet and weight loss We will see you back in 6 months. Keep monitoring your blood pressure Call for any problems

## 2011-04-04 NOTE — Progress Notes (Signed)
    Robert Schultz Date of Birth: 08-03-1966   History of Present Illness:  Robert Schultz is seen back today for his ETT. He is doing well with no complaints. He is working with his sister on his diet. He exercises about 4 x a week for 30 minutes. No chest pain but has multiple cardiovascular risk factors.    No current outpatient prescriptions on file prior to visit.    Allergies  Allergen Reactions  . Hydrocodone   . Penicillins     Past Medical History  Diagnosis Date  . Hyperlipidemia   . Morbid obesity   . Gastritis   . Palpitations   . AV block   . HTN (hypertension)   . Normal coronary angiogram 2004    Past Surgical History  Procedure Date  . Cardiac catheterization 11/17/2002    NORMAL. EF 65%    History  Smoking status  . Never Smoker   Smokeless tobacco  . Never Used    History  Alcohol Use No    Family History  Problem Relation Age of Onset  . Multiple sclerosis Mother     Review of Systems: The review of systems is as above.  All other systems were reviewed and are negative.  Physical Exam: Ht 5\' 8"  (1.727 m)  Wt 262 lb 9.6 oz (119.115 kg)  BMI 39.93 kg/m2  Robert Schultz exercised on the standard Bruce protocol for a total of 12 minutes. He exercised to a maximum of 4. at 16% grade. Heart rate rose from 70 to 161. Blood pressure rose from 132/80 to a maximum of 170/70. Test was stopped due to target heart rate achieved. No complaints of chest pain. EKG is negative for ischemia.  LABORATORY DATA:   Assessment / Plan: 1. Excellent exercise tolerance 2. Adequate blood pressure response 3. Clinically negative for chest pain 4. EKG negative for ischemia.

## 2011-04-04 NOTE — Assessment & Plan Note (Signed)
Treadmill looks good. He is encouraged to modify his risk factors. He will see Dr. Elease Hashimoto in 6 months. He is to have repeat labs in about 3 months. Patient is agreeable to this plan and will call if any problems develop in the interim.

## 2011-08-30 ENCOUNTER — Other Ambulatory Visit: Payer: Self-pay | Admitting: Sports Medicine

## 2011-08-30 ENCOUNTER — Ambulatory Visit
Admission: RE | Admit: 2011-08-30 | Discharge: 2011-08-30 | Disposition: A | Discharge status: Home / Self Care | Payer: PRIVATE HEALTH INSURANCE | Source: Ambulatory Visit | Attending: Sports Medicine | Admitting: Sports Medicine

## 2011-08-30 MED ORDER — IOHEXOL 300 MG/ML  SOLN
125.0000 mL | Freq: Once | INTRAMUSCULAR | Status: AC | PRN
  Administered 2011-08-30: 125 mL via INTRAVENOUS

## 2012-11-13 ENCOUNTER — Ambulatory Visit (INDEPENDENT_AMBULATORY_CARE_PROVIDER_SITE_OTHER): Payer: PRIVATE HEALTH INSURANCE | Admitting: Nurse Practitioner

## 2012-11-13 ENCOUNTER — Encounter: Payer: Self-pay | Admitting: Nurse Practitioner

## 2012-11-13 VITALS — BP 128/82 | HR 72 | Ht 68.0 in | Wt 268.2 lb

## 2012-11-13 DIAGNOSIS — I10 Essential (primary) hypertension: Secondary | ICD-10-CM

## 2012-11-13 DIAGNOSIS — E785 Hyperlipidemia, unspecified: Secondary | ICD-10-CM

## 2012-11-13 DIAGNOSIS — R011 Cardiac murmur, unspecified: Secondary | ICD-10-CM

## 2012-11-13 NOTE — Patient Instructions (Addendum)
We will update your ultrasound of your heart  We will check fasting labs on the day of your ultrasound  Stay active  I will see you in 6 months  Call the [facility] office at [phone] if you have any questions, problems or concerns.

## 2012-11-13 NOTE — Progress Notes (Signed)
Debroah Schultz Date of Birth: Feb 09, 1966 Medical Record #[id number]  History of Present Illness: Robert Schultz is seen back today for a follow up visit. He is seen for Dr. Elease Hashimoto. He is a former patient of Dr. Ronnald Nian. He had a normal cardiac cath in 2004. He has HLD and obesity. He has not wished to take statins. He does have sleep apnea and has had chronic PVC's. He has not tolerated medical therapy in the remote past (Bystolic, atenolol and verapamil).   I last saw him in 2012. We did an ETT. He exercised 12 minutes and did fine.   He comes back today. Has been doing well. No exertional chest pain. Does have some sharp pains that moves around in his chest and are clearly related to twisting and turning type motions as well as palpable chest pain. Weight remains an issue - he is not up or down any pounds. 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 Remains very active - playing soccer. He is able to keep up without too much trouble. Was doing ok up until this past Monday. Woke up very dizzy. Went to an Urgent Care and told that he had vertigo. Still with some mild positional symptoms. Was told at that visit that he had a murmur. Last echo was in 2010 and was ok.    Current Outpatient Prescriptions on File Prior to Visit  Medication Sig Dispense Refill  . Acetaminophen (TYLENOL PO) Take by mouth.        Marland Kitchen ibuprofen (ADVIL,MOTRIN) 200 MG tablet Take 200 mg by mouth every 6 (six) hours as needed.        . Ranitidine HCl (ZANTAC PO) Take by mouth.         No current facility-administered medications on file prior to visit.    Allergies  Allergen Reactions  . Hydrocodone   . Penicillins     Past Medical History  Diagnosis Date  . Hyperlipidemia   . Morbid obesity   . Gastritis   . Palpitations   . AV block   . HTN (hypertension)   . Normal coronary angiogram 2004    Past Surgical History  Procedure Laterality Date  . Cardiac catheterization   11/17/2002    NORMAL. EF 65%    History  Smoking status  . Never Smoker   Smokeless tobacco  . Never Used    History  Alcohol Use No    Family History  Problem Relation Age of Onset  . Multiple sclerosis Mother     Review of Systems: The review of systems is per the HPI.  All other systems were reviewed and are negative.  Physical Exam: BP 128/82  Pulse 72  Ht 5\' 8"  (1.727 m)  Wt 268 lb 3.2 oz (121.655 kg)  BMI 40.79 kg/m2 Patient is very pleasant and in no acute distress. He remains obese. Skin is warm and dry. Color is normal.  HEENT is unremarkable. Normocephalic/atraumatic. PERRL. Sclera are nonicteric. Neck is supple. No masses. No JVD. Lungs are clear. Cardiac exam shows a regular rate and rhythm. Abdomen is soft. Extremities are without edema. Gait and ROM are intact. No gross neurologic deficits noted.  LABORATORY DATA: Lab Results  Component Value Date   GLUCOSE 127* 03/08/2011   CHOL 223* 03/08/2011   TRIG 230.0* 03/08/2011   HDL 33.60* 03/08/2011   LDLDIRECT 163.6 03/08/2011   ALT 39 03/08/2011   AST 26 03/08/2011   NA 138 03/08/2011  K 4.4 03/08/2011   CL 103 03/08/2011   CREATININE 0.8 03/08/2011   BUN 16 03/08/2011   CO2 29 03/08/2011   Assessment / Plan: 1. Recent episode of vertigo - improving  2. HLD - no recent labs  3. ? Murmur - I do not hear this today.   4. HTN - has had mild LVH and diastolic dysfunction.  We will update his labs. Recheck his echo. I think he is doing ok. Encouraged weight loss. He remains quite active. I will see him back in 6 months.   Patient is agreeable to this plan and will call if any problems develop in the interim.

## 2012-11-19 ENCOUNTER — Other Ambulatory Visit (INDEPENDENT_AMBULATORY_CARE_PROVIDER_SITE_OTHER): Payer: PRIVATE HEALTH INSURANCE

## 2012-11-19 ENCOUNTER — Ambulatory Visit (HOSPITAL_COMMUNITY): Payer: PRIVATE HEALTH INSURANCE | Attending: Cardiology | Admitting: Radiology

## 2012-11-19 DIAGNOSIS — E785 Hyperlipidemia, unspecified: Secondary | ICD-10-CM

## 2012-11-19 DIAGNOSIS — I10 Essential (primary) hypertension: Secondary | ICD-10-CM | POA: Insufficient documentation

## 2012-11-19 DIAGNOSIS — R011 Cardiac murmur, unspecified: Secondary | ICD-10-CM

## 2012-11-19 DIAGNOSIS — I4949 Other premature depolarization: Secondary | ICD-10-CM

## 2012-11-19 LAB — CBC WITH DIFFERENTIAL/PLATELET
Basophils Absolute: 0 10*3/uL (ref 0.0–0.1)
Basophils Relative: 0.2 % (ref 0.0–3.0)
Eosinophils Absolute: 0.1 10*3/uL (ref 0.0–0.7)
Eosinophils Relative: 1.4 % (ref 0.0–5.0)
HCT: 44.7 % (ref 39.0–52.0)
Hemoglobin: 15.1 g/dL (ref 13.0–17.0)
Lymphocytes Relative: 26.2 % (ref 12.0–46.0)
Lymphs Abs: 2.2 10*3/uL (ref 0.7–4.0)
MCHC: 33.8 g/dL (ref 30.0–36.0)
MCV: 98 fl (ref 78.0–100.0)
Monocytes Absolute: 0.6 10*3/uL (ref 0.1–1.0)
Monocytes Relative: 6.8 % (ref 3.0–12.0)
Neutro Abs: 5.5 10*3/uL (ref 1.4–7.7)
Neutrophils Relative %: 65.4 % (ref 43.0–77.0)
Platelets: 168 10*3/uL (ref 150.0–400.0)
RBC: 4.56 Mil/uL (ref 4.22–5.81)
RDW: 13.6 % (ref 11.5–14.6)
WBC: 8.5 10*3/uL (ref 4.5–10.5)

## 2012-11-19 LAB — TSH: TSH: 1.52 u[IU]/mL (ref 0.35–5.50)

## 2012-11-19 LAB — HEPATIC FUNCTION PANEL
ALT: 46 U/L (ref 0–53)
AST: 38 U/L — ABNORMAL HIGH (ref 0–37)
Albumin: 4 g/dL (ref 3.5–5.2)
Alkaline Phosphatase: 55 U/L (ref 39–117)
Bilirubin, Direct: 0 mg/dL (ref 0.0–0.3)
Total Bilirubin: 0.7 mg/dL (ref 0.3–1.2)
Total Protein: 7.2 g/dL (ref 6.0–8.3)

## 2012-11-19 LAB — LIPID PANEL
Cholesterol: 215 mg/dL — ABNORMAL HIGH (ref 0–200)
HDL: 29.2 mg/dL — ABNORMAL LOW (ref >39.00)
Total CHOL/HDL Ratio: 7
Triglycerides: 259 mg/dL — ABNORMAL HIGH (ref 0.0–149.0)
VLDL: 51.8 mg/dL — ABNORMAL HIGH (ref 0.0–40.0)

## 2012-11-19 LAB — LDL CHOLESTEROL, DIRECT: Direct LDL: 152.5 mg/dL

## 2012-11-19 LAB — BASIC METABOLIC PANEL
BUN: 10 mg/dL (ref 6–23)
CO2: 27 mEq/L (ref 19–32)
Calcium: 8.9 mg/dL (ref 8.4–10.5)
Chloride: 100 mEq/L (ref 96–112)
Creatinine, Ser: 0.9 mg/dL (ref 0.4–1.5)
GFR: 100.8 mL/min (ref >60.00)
Glucose, Bld: 215 mg/dL — ABNORMAL HIGH (ref 70–99)
Potassium: 4.1 mEq/L (ref 3.5–5.1)
Sodium: 137 mEq/L (ref 135–145)

## 2012-11-19 NOTE — Progress Notes (Signed)
Echocardiogram performed.  

## 2012-11-20 ENCOUNTER — Ambulatory Visit: Payer: PRIVATE HEALTH INSURANCE | Admitting: Nurse Practitioner

## 2012-11-23 ENCOUNTER — Telehealth: Payer: Self-pay | Admitting: Nurse Practitioner

## 2012-11-23 ENCOUNTER — Other Ambulatory Visit: Payer: Self-pay

## 2012-11-23 DIAGNOSIS — Z9189 Other specified personal risk factors, not elsewhere classified: Secondary | ICD-10-CM

## 2012-11-23 NOTE — Telephone Encounter (Signed)
New Problem: ° ° ° °Patient returned your call.  Please call back. °

## 2012-11-23 NOTE — Telephone Encounter (Signed)
**Note De-identified Desiraye Rolfson Obfuscation** LMTCB

## 2012-11-23 NOTE — Telephone Encounter (Signed)
**Note De-identified Robert Schultz Obfuscation** Pt advised. See lab results. 

## 2012-11-23 NOTE — Telephone Encounter (Signed)
New Problem:    Patient called in returning Lynn's call regarding his ECHO results.  Please call back.

## 2012-11-25 ENCOUNTER — Encounter: Payer: Self-pay | Admitting: Emergency Medicine

## 2012-11-25 ENCOUNTER — Emergency Department
Admission: EM | Admit: 2012-11-25 | Discharge: 2012-11-25 | Disposition: A | Discharge status: Home / Self Care | Payer: PRIVATE HEALTH INSURANCE | Source: Home / Self Care

## 2012-11-25 ENCOUNTER — Emergency Department (INDEPENDENT_AMBULATORY_CARE_PROVIDER_SITE_OTHER): Payer: PRIVATE HEALTH INSURANCE

## 2012-11-25 DIAGNOSIS — R059 Cough, unspecified: Secondary | ICD-10-CM

## 2012-11-25 DIAGNOSIS — R0989 Other specified symptoms and signs involving the circulatory and respiratory systems: Secondary | ICD-10-CM

## 2012-11-25 DIAGNOSIS — J069 Acute upper respiratory infection, unspecified: Secondary | ICD-10-CM

## 2012-11-25 DIAGNOSIS — R509 Fever, unspecified: Secondary | ICD-10-CM

## 2012-11-25 MED ORDER — METHYLPREDNISOLONE ACETATE 80 MG/ML IJ SUSP
80.0000 mg | Freq: Once | INTRAMUSCULAR | Status: AC
  Administered 2012-11-25: 80 mg via INTRAMUSCULAR

## 2012-11-25 MED ORDER — AZITHROMYCIN 250 MG PO TABS: ORAL_TABLET | ORAL | Status: DC

## 2012-11-25 NOTE — ED Provider Notes (Signed)
History     CSN: [id number]  Arrival date & time 11/25/12  1407   None     Chief Complaint  Patient presents with  . Cough   HPI  URI Symptoms Onset: 3-4 days  Description: rhinorrhea, nasal congestion, cough Modifying factors:  none  Symptoms Nasal discharge: yes Fever: low grade at home per pt Sore throat: mild Cough: yes Wheezing: no Ear pain: no GI symptoms: no Sick contacts: yes  Red Flags  Stiff neck: no Dyspnea: mild  Rash: no Swallowing difficulty: no  Sinusitis Risk Factors Headache/face pain: no Double sickening: no tooth pain: no  Allergy Risk Factors Sneezing: no Itchy scratchy throat: no Seasonal symptoms: no  Flu Risk Factors Headache: no muscle aches: n severe fatigue: no   Past Medical History  Diagnosis Date  . Hyperlipidemia   . Morbid obesity   . Gastritis   . Palpitations   . AV block   . HTN (hypertension)   . Normal coronary angiogram 2004    Past Surgical History  Procedure Laterality Date  . Cardiac catheterization  11/17/2002    NORMAL. EF 65%    Family History  Problem Relation Age of Onset  . Multiple sclerosis Mother     History  Substance Use Topics  . Smoking status: Never Smoker   . Smokeless tobacco: Never Used  . Alcohol Use: No      Review of Systems  All other systems reviewed and are negative.    Allergies  Hydrocodone and Penicillins  Home Medications   Current Outpatient Rx  Name  Route  Sig  Dispense  Refill  . Acetaminophen (TYLENOL PO)   Oral   Take by mouth.           Marland Kitchen ibuprofen (ADVIL,MOTRIN) 200 MG tablet   Oral   Take 200 mg by mouth every 6 (six) hours as needed.           . minocycline (MINOCIN,DYNACIN) 100 MG capsule   Oral   Take 100 mg by mouth 2 (two) times daily.         . Ranitidine HCl (ZANTAC PO)   Oral   Take by mouth.             BP 128/86  Pulse 96  Temp(Src) 97.6 F (36.4 C) (Oral)  Ht 5\' 8"  (1.727 m)  Wt 261 lb (118.389 kg)  BMI 39.69  kg/m2  SpO2 98%  Physical Exam  Constitutional:  Obese    HENT:  Head: Normocephalic and atraumatic.  Right Ear: External ear normal.  Left Ear: External ear normal.  +nasal erythema, rhinorrhea bilaterally, + post oropharyngeal erythema    Eyes: Conjunctivae are normal. Pupils are equal, round, and reactive to light.  Neck: Normal range of motion. Neck supple.  Cardiovascular: Normal rate, regular rhythm and normal heart sounds.   Pulmonary/Chest: Effort normal.  + RLL rales and wheezes   Abdominal: Soft.  Musculoskeletal: Normal range of motion.  Lymphadenopathy:    He has no cervical adenopathy.  Neurological: He is alert.  Skin: Skin is warm.    ED Course  Procedures (including critical care time)  Labs Reviewed - No data to display Dg Chest 2 View  11/25/2012  *RADIOLOGY REPORT*  Clinical Data: Cough, fever, congestion, rales right lower lobe question infiltrate  CHEST - 2 VIEW  Comparison: 08/18/2011  Findings: Mild chronic elevation of right diaphragm. Normal heart size, mediastinal contours, and pulmonary vascularity. Lungs clear. No pleural effusion or  pneumothorax. Bones unremarkable.  IMPRESSION: No acute abnormalities.   Original Report Authenticated By: Ulyses Southward, M.D.      1. URI (upper respiratory infection)       MDM  CXR negative for focal infiltrate.  Depomedrol 80mg  IM x1 for wheezing.  Zpak if sxs worsen or fail to improve in next 3-5 days.  Discussed infectious and resp red flags  Follow up as needed.     The patient and/or caregiver has been counseled thoroughly with regard to treatment plan and/or medications prescribed including dosage, schedule, interactions, rationale for use, and possible side effects and they verbalize understanding. Diagnoses and expected course of recovery discussed and will return if not improved as expected or if the condition worsens. Patient and/or caregiver verbalized understanding.             Doree Albee, MD 11/25/12 [phone]

## 2012-11-25 NOTE — ED Notes (Signed)
Productive cough, fever, watery eyes x 4 days

## 2012-11-27 ENCOUNTER — Other Ambulatory Visit: Payer: PRIVATE HEALTH INSURANCE

## 2013-05-14 ENCOUNTER — Ambulatory Visit: Payer: PRIVATE HEALTH INSURANCE | Admitting: Nurse Practitioner

## 2013-06-24 ENCOUNTER — Emergency Department (INDEPENDENT_AMBULATORY_CARE_PROVIDER_SITE_OTHER): Payer: PRIVATE HEALTH INSURANCE

## 2013-06-24 ENCOUNTER — Encounter: Payer: Self-pay | Admitting: *Deleted

## 2013-06-24 ENCOUNTER — Emergency Department
Admission: EM | Admit: 2013-06-24 | Discharge: 2013-06-24 | Disposition: A | Discharge status: Home / Self Care | Payer: PRIVATE HEALTH INSURANCE | Source: Home / Self Care | Attending: Emergency Medicine | Admitting: Emergency Medicine

## 2013-06-24 DIAGNOSIS — S20221A Contusion of right back wall of thorax, initial encounter: Secondary | ICD-10-CM

## 2013-06-24 DIAGNOSIS — S20219A Contusion of unspecified front wall of thorax, initial encounter: Secondary | ICD-10-CM

## 2013-06-24 DIAGNOSIS — S20229A Contusion of unspecified back wall of thorax, initial encounter: Secondary | ICD-10-CM

## 2013-06-24 DIAGNOSIS — S20212A Contusion of left front wall of thorax, initial encounter: Secondary | ICD-10-CM

## 2013-06-24 DIAGNOSIS — S20222A Contusion of left back wall of thorax, initial encounter: Secondary | ICD-10-CM

## 2013-06-24 DIAGNOSIS — R071 Chest pain on breathing: Secondary | ICD-10-CM

## 2013-06-24 DIAGNOSIS — S20211A Contusion of right front wall of thorax, initial encounter: Secondary | ICD-10-CM

## 2013-06-24 DIAGNOSIS — M549 Dorsalgia, unspecified: Secondary | ICD-10-CM

## 2013-06-24 LAB — POCT CBC W AUTO DIFF (K'VILLE URGENT CARE)

## 2013-06-24 LAB — POCT URINALYSIS DIPSTICK
Bilirubin, UA: NEGATIVE
Blood, UA: NEGATIVE
Glucose, UA: NEGATIVE
Ketones, UA: NEGATIVE
Leukocytes, UA: NEGATIVE
Nitrite, UA: NEGATIVE
Protein, UA: NEGATIVE
Spec Grav, UA: 1.02 (ref 1.005–1.03)
Urobilinogen, UA: 0.2 (ref 0–1)
pH, UA: 6 (ref 5–8)

## 2013-06-24 MED ORDER — CYCLOBENZAPRINE HCL 10 MG PO TABS: 10.0000 mg | ORAL_TABLET | Freq: Two times a day (BID) | ORAL | Status: DC | PRN

## 2013-06-24 NOTE — ED Notes (Addendum)
Robert Schultz reports falling back from his lawn mower that was inclined onto a trailer  hitch, landing on his mid back. He is c/o constant mid back pain , he is also c/o abdominal pain. No previous injuries his back. He has not tried urinating since the fall.

## 2013-06-24 NOTE — ED Provider Notes (Signed)
CSN: [id number]     Arrival date & time 06/24/13  1034 History   First MD Initiated Contact with Patient 06/24/13 1038     Chief Complaint  Patient presents with  . Back Injury   (Consider location/radiation/quality/duration/timing/severity/associated sxs/prior Treatment) Patient is a 47 y.o. male presenting with back pain. The history is provided by the patient.  Back Pain Location:  Thoracic spine Quality:  Stabbing Radiates to: From mid thoracic back bilaterally, as a burning sensation, around to the anterior abdomen, left greater than right. Pain severity:  Severe (Moderate to severe. 9/10 intensity) Onset quality:  Sudden Duration:  1 hour Timing:  Constant Progression:  Unchanged Chronicity:  New Context: falling   Context: not emotional stress, not MCA, not MVA and not recent illness   Context comment:  Was on a riding lawnmower, fell backwards off the lawnmower onto the ground Relieved by:  Nothing Worsened by:  Nothing tried Associated symptoms: abdominal pain   Associated symptoms: no abdominal swelling, no bladder incontinence, no bowel incontinence, no chest pain, no dysuria, no fever, no headaches, no leg pain, no numbness, no paresthesias, no pelvic pain, no perianal numbness, no tingling and no weakness   Risk factors: no recent surgery     Past Medical History  Diagnosis Date  . Hyperlipidemia   . Morbid obesity   . Gastritis   . Palpitations   . AV block   . HTN (hypertension)   . Normal coronary angiogram 2004   Past Surgical History  Procedure Laterality Date  . Cardiac catheterization  11/17/2002    NORMAL. EF 65%   Family History  Problem Relation Age of Onset  . Multiple sclerosis Mother   . Hypertension Father    History  Substance Use Topics  . Smoking status: Never Smoker   . Smokeless tobacco: Never Used  . Alcohol Use: No    Review of Systems  Constitutional: Negative for fever.  Cardiovascular: Negative for chest pain.    Gastrointestinal: Positive for abdominal pain. Negative for bowel incontinence.  Genitourinary: Negative for bladder incontinence, dysuria and pelvic pain.  Musculoskeletal: Positive for back pain.  Neurological: Negative for tingling, weakness, numbness, headaches and paresthesias.  All other systems reviewed and are negative.    Allergies  Hydrocodone and Penicillins  Home Medications   Current Outpatient Rx  Name  Route  Sig  Dispense  Refill  . cyclobenzaprine (FLEXERIL) 10 MG tablet   Oral   Take 1 tablet (10 mg total) by mouth 2 (two) times daily as needed for muscle spasms.   15 tablet   0   . minocycline (MINOCIN,DYNACIN) 100 MG capsule   Oral   Take 100 mg by mouth 2 (two) times daily.         . Ranitidine HCl (ZANTAC PO)   Oral   Take by mouth.            BP 133/86  Pulse 70  Resp 18  Ht 5\' 8"  (1.727 m)  Wt 250 lb (113.399 kg)  BMI 38.02 kg/m2  SpO2 99% Physical Exam  Nursing note and vitals reviewed. Constitutional: He is oriented to person, place, and time. He appears well-developed and well-nourished.  Non-toxic appearance. He appears distressed (Uncomfortable from back pain.).  HENT:  Head: Normocephalic and atraumatic.  Mouth/Throat: Oropharynx is clear and moist.  Eyes: EOM are normal. Pupils are equal, round, and reactive to light. No scleral icterus.  Neck: Normal range of motion. Neck supple.  Cardiovascular: Regular  rhythm and normal heart sounds.   Pulmonary/Chest: Breath sounds normal. No respiratory distress. He has no wheezes. He has no rales. He exhibits no tenderness.  Abdominal: Soft. Bowel sounds are normal. He exhibits no mass. There is tenderness. There is no rigidity, no rebound, no guarding and no CVA tenderness.  Abdomen obese, not distended. Moderate left anterior-lateral rib, costal margin and left upper quadrant tenderness but no hepatosplenomegaly. Mild to moderate right anterior lateral and costal margin rib tenderness.   Musculoskeletal:       Right hip: Normal.       Left hip: Normal.       Cervical back: Normal.       Thoracic back: He exhibits decreased range of motion, tenderness, bony tenderness and spasm. He exhibits no swelling, no edema, no deformity, no laceration and normal pulse.       Lumbar back: Normal.       Back:  Negative Right straight leg-raise test. Negative Left straight leg-raise test.  As depicted, superficial abrasions with SEVERE tenderness to palpation along lower thoracic spine and parathoracic area in a band like distribution   Neurological: He is alert and oriented to person, place, and time. He has normal strength. He displays no atrophy and no tremor. No cranial nerve deficit or sensory deficit. He exhibits normal muscle tone. Gait normal.  Reflex Scores:      Tricep reflexes are 2+ on the right side and 2+ on the left side.      Bicep reflexes are 2+ on the right side and 2+ on the left side.      Brachioradialis reflexes are 2+ on the right side and 2+ on the left side.      Patellar reflexes are 2+ on the right side and 2+ on the left side.      Achilles reflexes are 2+ on the right side and 2+ on the left side. Skin: Skin is warm, dry and intact. No lesion and no rash noted.  No other lesions in addition to his thoracic back  Psychiatric: He has a normal mood and affect.    ED Course  Procedures (including critical care time) Labs Review Labs Reviewed  POCT CBC W AUTO DIFF ([facility])  POCT URINALYSIS DIPSTICK   Imaging Review Dg Ribs Bilateral W/chest  06/24/2013   CLINICAL DATA:  Injury.  Chest wall pain.  EXAM: BILATERAL RIBS AND CHEST - 4+ VIEW  COMPARISON:  11/25/2012  FINDINGS: No fracture or other bone lesions are seen involving the ribs. There is no evidence of pneumothorax or pleural effusion. Both lungs are clear. Heart size and mediastinal contours are within normal limits.  IMPRESSION: Negative.   Electronically Signed   By: Amie Portland    On: 06/24/2013 12:26   Dg Thoracic Spine 2 View  06/24/2013   CLINICAL DATA:  Patient flipped lawnmower now complaining of back pain and bruising.  EXAM: THORACIC SPINE - 2 VIEW  COMPARISON:  Lateral chest radiograph, 11/25/2012  FINDINGS: No fracture or spondylolisthesis.  There are minor degenerative changes reflected by small endplate spurs along the mid and lower thoracic spine.  The soft tissues are unremarkable.  IMPRESSION: No fracture or acute finding.   Electronically Signed   By: Amie Portland   On: 06/24/2013 12:33    MDM   1. Contusion of mid back, left, initial encounter   2. Contusion of mid back, right, initial encounter   3. Contusion of ribs, left, initial encounter  4. Contusion of ribs, right, initial encounter    Reviewed x-rays bilateral ribs and x-rays thoracic spine, no acute abnormalities. No fractures. Normal neurologic exam. Discussed with patient  CBC within normal limits. Urinalysis ordered: Normal  I explained to patient negative tests. Explained the contusions may take days or weeks to resolve. Treatment options discussed. After risks, benefits, alternatives discussed, patient agrees with the following plans: He declined prescription pain medication, and he prefers to take ibuprofen and OTC up to 800 mg 3 times a day p.c. Precautions discussed. For muscle relaxant, Flexeril 10 mg, one half or 1 twice a day to 3 times a day Apply ice x48 hours. Other symptomatic care discussed. Note written to excuse from work x3 days. Followup with PCP if no better in 4 days, sooner if worse or new symptoms. Precautions discussed. Red flags discussed. Questions invited and answered. Patient voiced understanding and agreement.  Lajean Manes, MD 06/24/13 [phone]

## 2013-06-29 ENCOUNTER — Telehealth: Payer: Self-pay | Admitting: *Deleted

## 2013-08-11 ENCOUNTER — Emergency Department
Admission: EM | Admit: 2013-08-11 | Discharge: 2013-08-11 | Disposition: A | Discharge status: Home / Self Care | Payer: PRIVATE HEALTH INSURANCE | Source: Home / Self Care | Attending: Family Medicine | Admitting: Family Medicine

## 2013-08-11 ENCOUNTER — Encounter: Payer: Self-pay | Admitting: Emergency Medicine

## 2013-08-11 DIAGNOSIS — R42 Dizziness and giddiness: Secondary | ICD-10-CM

## 2013-08-11 LAB — POCT URINALYSIS DIP (MANUAL ENTRY)
Bilirubin, UA: NEGATIVE
Glucose, UA: NEGATIVE
Ketones, POC UA: NEGATIVE
Leukocytes, UA: NEGATIVE
Nitrite, UA: NEGATIVE
Protein Ur, POC: NEGATIVE
Spec Grav, UA: >=1.03 (ref 1.005–1.03)
Urobilinogen, UA: 0.2 (ref 0–1)
pH, UA: 5.5 (ref 5–8)

## 2013-08-11 MED ORDER — MECLIZINE HCL 25 MG PO TABS: ORAL_TABLET | ORAL | Status: DC

## 2013-08-11 NOTE — ED Provider Notes (Signed)
CSN: [id number]     Arrival date & time 08/11/13  1530 History   First MD Initiated Contact with Patient 08/11/13 1545     Chief Complaint  Patient presents with  . Dizziness  . Nausea      HPI Comments: Patient began to feel slightly "off balance" about 5 to 6 days ago without a distinct spinning sensation.  He has had a mild headache.  Today he developed mild nausea without vomiting, a dry mouth, and chills.  He has had loose stools for about 4 days.  No fevers, chills, and sweats.  No localizing neurologic symptoms.  He notes that his urination has been less in volume than normal.  He states that he was started on metformin for diabetes about one week ago.                                                                                                                                                                                                                                            The history is provided by the patient.    Past Medical History  Diagnosis Date  . Hyperlipidemia   . Morbid obesity   . Gastritis   . Palpitations   . AV block   . HTN (hypertension)   . Normal coronary angiogram 2004   Past Surgical History  Procedure Laterality Date  . Cardiac catheterization  11/17/2002    NORMAL. EF 65%   Family History  Problem Relation Age of Onset  . Multiple sclerosis Mother   . Hypertension Father    History  Substance Use Topics  . Smoking status: Never Smoker   . Smokeless tobacco: Never Used  . Alcohol Use: No    Review of Systems  Constitutional: Positive for chills and fatigue. Negative for fever, diaphoresis and appetite change.  HENT: Negative for congestion, ear pain, facial swelling, hearing loss, mouth sores, postnasal drip, sinus pressure, sore throat, tinnitus and trouble swallowing.   Eyes: Negative for photophobia and pain.  Respiratory: Negative.   Cardiovascular: Negative.   Gastrointestinal: Negative.   Endocrine: Positive for polyuria.   Genitourinary: Negative.  Negative for difficulty urinating.  Musculoskeletal: Negative for myalgias.  Skin: Negative for rash.  Neurological: Positive for dizziness and headaches. Negative for tremors, seizures, syncope, facial asymmetry, speech difficulty, weakness, light-headedness and numbness.    Allergies  Hydrocodone and Penicillins  Home Medications   Current Outpatient Rx  Name  Route  Sig  Dispense  Refill  . metFORMIN (GLUCOPHAGE) 500 MG tablet   Oral   Take by mouth 2 (two) times daily with a meal.         . cyclobenzaprine (FLEXERIL) 10 MG tablet   Oral   Take 1 tablet (10 mg total) by mouth 2 (two) times daily as needed for muscle spasms.   15 tablet   0   . meclizine (ANTIVERT) 25 MG tablet      Take one tab by mouth 2 or 3 times daily as needed for dizziness   15 tablet   0   . minocycline (MINOCIN,DYNACIN) 100 MG capsule   Oral   Take 100 mg by mouth 2 (two) times daily.         . Ranitidine HCl (ZANTAC PO)   Oral   Take by mouth.            BP 130/85  Pulse 90  Temp(Src) 98.7 F (37.1 C) (Oral)  Resp 18  Ht 5\' 8"  (1.727 m)  Wt 253 lb (114.76 kg)  BMI 38.48 kg/m2  SpO2 98% Physical Exam Nursing notes and Vital Signs reviewed. Appearance:  Patient appears stated age, and in no acute distress.  Patient is obese (BMI 38.5) Eyes:  Pupils are equal, round, and reactive to light and accomodation.  Extraocular movement is intact.  Conjunctivae are not inflamed.  Fundi benign.  Slight nystagmus present on lateral gaze.  Ears:  Canals normal.  Tympanic membranes normal.  Nose:  Normal turbinates.  No sinus tenderness.   Pharynx:  Normal Neck:  Supple.   No adenopathy Lungs:  Clear to auscultation.  Breath sounds are equal.  Heart:  Regular rate and rhythm without murmurs, rubs, or gallops.  Abdomen:  Nontender without masses or hepatosplenomegaly.  Bowel sounds are present.  No CVA or flank tenderness.  Extremities:  No edema.  No calf  tenderness Skin:  No rash present.   Neurologic:  Cranial nerves 2 through 12 are normal.  Patellar elbow reflexes are normal.  Cerebellar function is intact (finger-to-nose and rapid alternating hand movement).  Gait and station are normal.                                            ED Course  Procedures  none    Labs Reviewed  POCT URINALYSIS DIP (MANUAL ENTRY) SG >= 1.030; BLO trace-intact, otherwise negative         MDM   1. Vertigo    Begin antivert. Increase fluid intake (note concentrated urine, probably a result of loose stools)  Followup with ENT if not improved about 10 days.  Discussed red flags.    Lattie Haw, MD 08/11/13 [phone]

## 2013-08-11 NOTE — ED Notes (Signed)
Pt c/o dizziness, a little nausea, dry mouth, and HA x 5 days. Denies fever.

## 2013-08-11 NOTE — ED Notes (Signed)
Bed: ZOX0 Expected date:  Expected time:  Means of arrival:  Comments: [facility]

## 2013-08-25 ENCOUNTER — Encounter: Payer: Self-pay | Admitting: Neurology

## 2013-08-26 ENCOUNTER — Encounter: Payer: Self-pay | Admitting: Neurology

## 2013-08-26 ENCOUNTER — Ambulatory Visit (INDEPENDENT_AMBULATORY_CARE_PROVIDER_SITE_OTHER): Payer: PRIVATE HEALTH INSURANCE | Admitting: Neurology

## 2013-08-26 DIAGNOSIS — G4733 Obstructive sleep apnea (adult) (pediatric): Secondary | ICD-10-CM

## 2013-08-26 DIAGNOSIS — E785 Hyperlipidemia, unspecified: Secondary | ICD-10-CM

## 2013-08-26 DIAGNOSIS — I10 Essential (primary) hypertension: Secondary | ICD-10-CM

## 2013-08-26 DIAGNOSIS — E119 Type 2 diabetes mellitus without complications: Secondary | ICD-10-CM | POA: Insufficient documentation

## 2013-08-26 DIAGNOSIS — R209 Unspecified disturbances of skin sensation: Secondary | ICD-10-CM

## 2013-08-26 DIAGNOSIS — R200 Anesthesia of skin: Secondary | ICD-10-CM

## 2013-08-26 NOTE — Progress Notes (Signed)
[facility]  PATIENT: Robert Schultz DOB: November 29, 1965  HISTORICAL Mr. Oblinger is a 47 years old Caucasian male, referred by orthopedic surgeon Dr. Avon Gully for evaluation of constellation of complaints,  He had past medical history of obesity, diabetes, complains 2 weeks history of feeling imbalanced, unsteadiness, starting since November 2014,  He came in with a list of complaints, he complains as if he is going to fall over, but never did loose his balance, and feel like going to factor 1 side, usually after turning the corner, dizziness when turning quickly, looking up quickly, felt uncoordinated, he also complains of intermittent bilateral hands and feet paresthesia, usually when he sat down, or lying down, late afternoon pressure headaches, feeling sensation in his right eye, intermittent facial tingling, especially at his right face, right eyelid twitching, also sensitive to loud voice, sometimes he gets a sharp lightening jolts sensation in his head, only lasting a few seconds,  His symptoms is correlated with his recent diagnosis of type 2 diabetes, A1c 8 point 3 was started on metformin 500 mg 2 tablets each day, he also complains of high stress level,  MRI cervical spine in August 06 2013 at [facility], demonstrate multilevel mild spondylitic disease, there is no evidence of foraminal or cord compression, no intramedullary pathology  he worries the possibility of multiple sclerosis, because his mother suffered multiple sclerosis       REVIEW OF SYSTEMS: Full 14 system review of systems performed and notable only for as above  ALLERGIES: Allergies  Allergen Reactions  . Hydrocodone   . Penicillins     HOME MEDICATIONS: Outpatient Prescriptions Prior to Visit  Medication Sig Dispense Refill  . minocycline (MINOCIN,DYNACIN) 100 MG capsule Take 100 mg by mouth 2 (two) times daily.      . Ranitidine HCl (ZANTAC PO) Take by mouth.         . Vitamin D, Ergocalciferol, (DRISDOL) 50000 UNITS CAPS capsule       . cyclobenzaprine (FLEXERIL) 10 MG tablet Take 1 tablet (10 mg total) by mouth 2 (two) times daily as needed for muscle spasms.  15 tablet  0  . meclizine (ANTIVERT) 25 MG tablet Take one tab by mouth 2 or 3 times daily as needed for dizziness  15 tablet  0  . metFORMIN (GLUCOPHAGE) 500 MG tablet Take by mouth 2 (two) times daily with a meal.      . predniSONE (STERAPRED UNI-PAK) 5 MG TABS tablet Take 5 mg by mouth daily.       No facility-administered medications prior to visit.    PAST MEDICAL HISTORY: Past Medical History  Diagnosis Date  . Hyperlipidemia   . Morbid obesity   . Gastritis   . Palpitations   . AV block   . HTN (hypertension)   . Normal coronary angiogram 2004  . Cervical radicular pain   . Thoracic spine pain   . Radiculitis of leg   . Sleep apnea   . Diverticulitis of colon     PAST SURGICAL HISTORY: Past Surgical History  Procedure Laterality Date  . Cardiac catheterization  11/17/2002    NORMAL. EF 65%    FAMILY HISTORY: Family History  Problem Relation Age of Onset  . Multiple sclerosis Mother   . Hypertension Father     SOCIAL HISTORY:  History   Social History  . Marital Status: Married    Spouse Name: N/A    Number of Children: N/A  . Years  of Education: N/A   Occupational History  . Not on file.   Social History Main Topics  . Smoking status: Never Smoker   . Smokeless tobacco: Never Used  . Alcohol Use: No  . Drug Use: No  . Sexual Activity: Yes   Other Topics Concern  . Not on file   Social History Narrative  . No narrative on file     PHYSICAL EXAM   There were no vitals filed for this visit.  Not recorded    There is no weight on file to calculate BMI.   Generalized: In no acute distress  Neck: Supple, no carotid bruits   Cardiac: Regular rate rhythm  Pulmonary: Clear to auscultation bilaterally  Musculoskeletal: No  deformity  Neurological examination  Mentation: Alert oriented to time, place, history taking, and causual conversation  Cranial nerve II-XII: Pupils were equal round reactive to light extraocular movements were full, visual field were full on confrontational test. facial sensation and strength were normal. hearing was intact to finger rubbing bilaterally. Uvula tongue midline.  head turning and shoulder shrug and were normal and symmetric.Tongue protrusion into cheek strength was normal.  Motor: normal tone, bulk and strength.  Sensory: Intact to fine touch, pinprick, preserved vibratory sensation, and proprioception at toes.  Coordination: Normal finger to nose, heel-to-shin bilaterally there was no truncal ataxia  Gait:  obese, Rising up from seated position without assistance, normal stance, without trunk ataxia, moderate stride, good arm swing, smooth turning, able to perform tiptoe, and heel walking without difficulty.   Romberg signs: Negative  Deep tendon reflexes: Brachioradialis 2/2, biceps 2/2, triceps 2/2, patellar 2/2, Achilles 2/2, plantar responses were flexor bilaterally.   DIAGNOSTIC DATA (LABS, IMAGING, TESTING) - I reviewed patient records, labs, notes, testing and imaging myself where available.  Lab Results  Component Value Date   WBC 8.5 11/19/2012   HGB 15.1 11/19/2012   HCT 44.7 11/19/2012   MCV 98.0 11/19/2012   PLT 168.0 11/19/2012      Component Value Date/Time   NA 137 11/19/2012 0900   K 4.1 11/19/2012 0900   CL 100 11/19/2012 0900   CO2 27 11/19/2012 0900   GLUCOSE 215* 11/19/2012 0900   BUN 10 11/19/2012 0900   CREATININE 0.9 11/19/2012 0900   CALCIUM 8.9 11/19/2012 0900   PROT 7.2 11/19/2012 0900   ALBUMIN 4.0 11/19/2012 0900   AST 38* 11/19/2012 0900   ALT 46 11/19/2012 0900   ALKPHOS 55 11/19/2012 0900   BILITOT 0.7 11/19/2012 0900   Lab Results  Component Value Date   CHOL 215* 11/19/2012   HDL 29.20* 11/19/2012   LDLDIRECT 152.5 11/19/2012   TRIG  259.0* 11/19/2012   CHOLHDL 7 11/19/2012   Lab Results  Component Value Date   TSH 1.52 11/19/2012     ASSESSMENT AND PLAN   47 years old Caucasian male, with past medical history of obesity, presenting with bilateral hands feet paresthesia, intermittent headaches, correlate with his recent diagnosis of diabetes, excessive stress,  1 most consistent with diabetic peripheral neuropathy, 2.  less likely multiple sclerosis, we will proceed with MRI of brain, because of his fear of multiple sclerosis family history of MS,  and recent onset headaches, 3. I will call him MRI result, he should optimize his glucose control moderate exercise, weight loss    Return to clinic as needed   Levert Feinstein, M.D. Ph.D.  [facility] [address] [place], [place] [address] [phone]

## 2013-09-08 ENCOUNTER — Ambulatory Visit (INDEPENDENT_AMBULATORY_CARE_PROVIDER_SITE_OTHER): Payer: PRIVATE HEALTH INSURANCE

## 2013-09-08 DIAGNOSIS — E119 Type 2 diabetes mellitus without complications: Secondary | ICD-10-CM

## 2013-09-08 DIAGNOSIS — I10 Essential (primary) hypertension: Secondary | ICD-10-CM

## 2013-09-08 DIAGNOSIS — E785 Hyperlipidemia, unspecified: Secondary | ICD-10-CM

## 2013-09-08 DIAGNOSIS — R200 Anesthesia of skin: Secondary | ICD-10-CM

## 2013-09-08 DIAGNOSIS — R209 Unspecified disturbances of skin sensation: Secondary | ICD-10-CM

## 2013-09-08 DIAGNOSIS — G4733 Obstructive sleep apnea (adult) (pediatric): Secondary | ICD-10-CM

## 2013-09-09 MED ORDER — GADOPENTETATE DIMEGLUMINE 469.01 MG/ML IV SOLN: 20.0000 mL | Freq: Once | INTRAVENOUS | Status: AC | PRN

## 2013-09-10 ENCOUNTER — Telehealth: Payer: Self-pay

## 2013-09-10 NOTE — Progress Notes (Signed)
Quick Note:  Please call patient for normal MRI brain ______ 

## 2013-09-10 NOTE — Telephone Encounter (Addendum)
Message copied by [facility] on Fri Sep 10, 2013  4:18 PM ------      Message from: Levert Feinstein      Created: Fri Sep 10, 2013 12:59 PM       Please call patient for normal MRI brain ------  I called patient and reviewed findings. He has asked if I could forward the report to Dr. Serena Colonel, his ENT, and he will follow up with his. Patient states, "That explains what's going on with my sinuses". He appreciated our follow up.

## 2013-10-01 ENCOUNTER — Ambulatory Visit (HOSPITAL_BASED_OUTPATIENT_CLINIC_OR_DEPARTMENT_OTHER): Payer: PRIVATE HEALTH INSURANCE | Attending: Otolaryngology

## 2013-10-01 VITALS — Ht 68.0 in | Wt 240.0 lb

## 2013-10-01 DIAGNOSIS — G4733 Obstructive sleep apnea (adult) (pediatric): Secondary | ICD-10-CM | POA: Insufficient documentation

## 2013-10-03 DIAGNOSIS — G4733 Obstructive sleep apnea (adult) (pediatric): Secondary | ICD-10-CM

## 2013-10-03 NOTE — Sleep Study (Signed)
   NAME: Robert Schultz DATE OF BIRTH:  1965-10-03 MEDICAL RECORD NUMBER [id number]  LOCATION: [facility]  PHYSICIAN: Kirstine Jacquin D  DATE OF STUDY: 10/01/2013  SLEEP STUDY TYPE: Nocturnal Polysomnogram               REFERRING PHYSICIAN: Izora Gala, MD  INDICATION FOR STUDY: Hypersomnia with sleep apnea  EPWORTH SLEEPINESS SCORE:   8/24 HEIGHT: 5\' 8"  (172.7 cm)  WEIGHT: 108.863 kg (240 lb)    Body mass index is 36.5 kg/(m^2).  NECK SIZE: 18 in.  MEDICATIONS: Charted for review  SLEEP ARCHITECTURE: Total sleep time 243 minutes with sleep efficiency 65.1%. Stage I was 21.2%, stage II 78.2%, stage III absent, REM 0.6% of total sleep time. Sleep latency 15 minutes, REM latency 232.5 minutes, awake after sleep onset 80.5 minutes, arousal index 8.4. Bedtime medication: None  RESPIRATORY DATA: Apnea hypopnea index (AHI) 6.4 per hour. A total of 26 events were scored including one central apnea and 25 hypopneas. Events were not positional. REM AHI 40 per hour. There were not enough events to permit split protocol CPAP titration.  OXYGEN DATA: Snoring was occasional and mild with oxygen desaturation to a nadir of 89% and mean oxygen saturation through the study of 94.3% on room air.  CARDIAC DATA: Sinus rhythm with PACs  MOVEMENT/PARASOMNIA: A total of 114 limb jerks were counted of which 9 were associated with arousal or awakening for periodic limb movement with arousal index of 2.2 per hour. Bathroom x1.  IMPRESSION/ RECOMMENDATION:   1) Mild obstructive sleep apnea/hypopneas syndrome, AHI 6.4 per hour with non-positional events. Occasional mild snoring with oxygen desaturation to a nadir of 89% and mean oxygen saturation through the study of 94.3% on room air. 2) A previous polysomnogram on 04/27/2006 recorded AHI 72.8 per hour with body weight 250 pounds. Is there clinical history to suggest why there is such a discrepancy between the 2 studies?  Signed Baird Lyons M.D. Deneise Lever Diplomate, American Board of Sleep Medicine  ELECTRONICALLY SIGNED ON:  10/03/2013, 4:20 PM [place] PH: [phone]   FX: [phone] [place]

## 2013-10-22 ENCOUNTER — Emergency Department
Admission: EM | Admit: 2013-10-22 | Discharge: 2013-10-22 | Disposition: A | Discharge status: Home / Self Care | Payer: PRIVATE HEALTH INSURANCE | Source: Home / Self Care | Attending: Family Medicine | Admitting: Family Medicine

## 2013-10-22 ENCOUNTER — Encounter: Payer: Self-pay | Admitting: Emergency Medicine

## 2013-10-22 DIAGNOSIS — J069 Acute upper respiratory infection, unspecified: Secondary | ICD-10-CM

## 2013-10-22 MED ORDER — AZITHROMYCIN 250 MG PO TABS: ORAL_TABLET | ORAL | Status: DC

## 2013-10-22 NOTE — ED Provider Notes (Signed)
CSN: [id number]     Arrival date & time 10/22/13  1522 History   First MD Initiated Contact with Patient 10/22/13 1526     Chief Complaint  Patient presents with  . Cough  . Fatigue    HPI  URI Symptoms Onset: 2-3 days  Description: rhinorrhea, nasal congestion, cough Modifying factors:  Baseline diabetic. Fairly well controlled   Symptoms Nasal discharge: yes Fever: no Sore throat: no Cough: yes Wheezing: no Ear pain: no GI symptoms: no Sick contacts: no  Red Flags  Stiff neck: no Dyspnea: no Rash: no Swallowing difficulty: no  Sinusitis Risk Factors Headache/face pain: no Double sickening: no tooth pain: no  Allergy Risk Factors Sneezing: no Itchy scratchy throat: no Seasonal symptoms: no  Flu Risk Factors Headache: no muscle aches: no severe fatigue: no   Past Medical History  Diagnosis Date  . Hyperlipidemia   . Morbid obesity   . Gastritis   . Palpitations   . AV block   . HTN (hypertension)   . Normal coronary angiogram 2004  . Cervical radicular pain   . Thoracic spine pain   . Radiculitis of leg   . Sleep apnea   . Diverticulitis of colon    Past Surgical History  Procedure Laterality Date  . Cardiac catheterization  11/17/2002    NORMAL. EF 65%   Family History  Problem Relation Age of Onset  . Multiple sclerosis Mother   . Hypertension Father    History  Substance Use Topics  . Smoking status: Never Smoker   . Smokeless tobacco: Never Used  . Alcohol Use: No    Review of Systems  All other systems reviewed and are negative.    Allergies  Hydrocodone and Penicillins  Home Medications   Current Outpatient Rx  Name  Route  Sig  Dispense  Refill  . azithromycin (ZITHROMAX) 250 MG tablet      Take 2 tabs PO x 1 dose, then 1 tab PO QD x 4 days   6 tablet   0   . metFORMIN (GLUCOPHAGE-XR) 500 MG 24 hr tablet   Oral   Take 500 mg by mouth daily with breakfast.         . minocycline (MINOCIN,DYNACIN) 100 MG  capsule   Oral   Take 100 mg by mouth 2 (two) times daily.         . Ranitidine HCl (ZANTAC PO)   Oral   Take by mouth.           . Vitamin D, Ergocalciferol, (DRISDOL) 50000 UNITS CAPS capsule                BP 127/87  Pulse 98  Temp(Src) 97.6 F (36.4 C) (Oral)  Resp 18  Wt 242 lb (109.77 kg)  SpO2 98% Physical Exam  Constitutional: He appears well-developed and well-nourished.  HENT:  Head: Normocephalic and atraumatic.  Eyes: Conjunctivae are normal. Pupils are equal, round, and reactive to light.  Neck: Normal range of motion. Neck supple.  Cardiovascular: Normal rate and regular rhythm.   Pulmonary/Chest: Effort normal and breath sounds normal.  Abdominal: Soft.  Musculoskeletal: Normal range of motion.  Neurological: He is alert.  Skin: Skin is warm.    ED Course  Procedures (including critical care time) Labs Review Labs Reviewed - No data to display Imaging Review No results found.    MDM   1. URI (upper respiratory infection)    Likley viral source of sxs.  Overall  reassuring exams.  PPx rx for zpak given baseline DM.  Discussed infectious and resp red flags at length Follow up as needed.    The patient and/or caregiver has been counseled thoroughly with regard to treatment plan and/or medications prescribed including dosage, schedule, interactions, rationale for use, and possible side effects and they verbalize understanding. Diagnoses and expected course of recovery discussed and will return if not improved as expected or if the condition worsens. Patient and/or caregiver verbalized understanding.         Shanda Howells, MD 10/22/13 [phone]

## 2013-10-22 NOTE — ED Notes (Signed)
Pt c/o fatigue, productive cough with clear mucus, and chest congestion x 3 days.

## 2013-10-24 ENCOUNTER — Telehealth: Payer: Self-pay

## 2013-10-24 NOTE — ED Notes (Signed)
Left a message on voice mail asking how patient is feeling and advising to call back with any questions or concerns.  

## 2013-10-29 ENCOUNTER — Encounter (HOSPITAL_BASED_OUTPATIENT_CLINIC_OR_DEPARTMENT_OTHER): Payer: PRIVATE HEALTH INSURANCE

## 2013-12-23 ENCOUNTER — Encounter (HOSPITAL_BASED_OUTPATIENT_CLINIC_OR_DEPARTMENT_OTHER): Payer: PRIVATE HEALTH INSURANCE

## 2014-01-24 ENCOUNTER — Ambulatory Visit (HOSPITAL_BASED_OUTPATIENT_CLINIC_OR_DEPARTMENT_OTHER): Payer: PRIVATE HEALTH INSURANCE | Attending: Otolaryngology

## 2014-01-24 VITALS — Ht 68.0 in | Wt 230.0 lb

## 2014-01-24 DIAGNOSIS — G4733 Obstructive sleep apnea (adult) (pediatric): Secondary | ICD-10-CM

## 2014-01-24 DIAGNOSIS — R0683 Snoring: Secondary | ICD-10-CM

## 2014-01-24 DIAGNOSIS — I10 Essential (primary) hypertension: Secondary | ICD-10-CM

## 2014-02-05 DIAGNOSIS — G4710 Hypersomnia, unspecified: Secondary | ICD-10-CM

## 2014-02-05 DIAGNOSIS — G4730 Sleep apnea, unspecified: Secondary | ICD-10-CM

## 2014-02-05 DIAGNOSIS — G4733 Obstructive sleep apnea (adult) (pediatric): Secondary | ICD-10-CM

## 2014-02-05 NOTE — Sleep Study (Signed)
   NAME: Robert Schultz DATE OF BIRTH:  04-28-1966 MEDICAL RECORD NUMBER [id number]  LOCATION: [facility]  PHYSICIAN: Kaian Fahs D Breslin Burklow  DATE OF STUDY: 01/24/2014  SLEEP STUDY TYPE: Nocturnal Polysomnogram               REFERRING PHYSICIAN: Izora Gala, MD  INDICATION FOR STUDY: Hypersomnia with sleep apnea  EPWORTH SLEEPINESS SCORE:   8/24 HEIGHT: 5\' 8"  (172.7 cm)  WEIGHT: 230 lb (104.327 kg)    Body mass index is 34.98 kg/(m^2).  NECK SIZE: 18 in.  MEDICATIONS: Charted for review  SLEEP ARCHITECTURE: Total sleep time 263 minutes with sleep efficiency 66.8%. Stage I was 11%, stage II 80.6%, stage III absent, REM 8.4% of total sleep time. Sleep latency 74 minutes, REM latency 245.5 minutes, awake after sleep onset 57 minutes, arousal index 27.8. Bedtime medication: None  RESPIRATORY DATA: Apnea hypopnea index (AHI) 2.1 per hour. 9 total events scored, all as hypopneas while nonsupine. REM AHI 19.1 per hour. There were not enough events to qualify for a split CPAP titration.  OXYGEN DATA: Occasional moderate snoring with oxygen desaturation to a nadir of 85% and mean oxygen saturation through the study of 94.8% on room air.  CARDIAC DATA: Sinus rhythm with occasional PAC and PVC  MOVEMENT/PARASOMNIA: 44 limb jerks were counted of which 8 were associated with arousal or awakening for a periodic limb movement with arousal index of 1.8 per hour. Bathroom x1  IMPRESSION/ RECOMMENDATION:   1) Occasional respiratory event with sleep disturbance, within normal limits. AHI 2.1 per hour (the normal range for adults is an AHI from 0-5 per hour). Occasional moderate snoring with oxygen desaturation to a nadir of 85% and mean oxygen saturation through the study of 94.8% on room air. No specific intervention is suggested by these results. Weight loss may be appropriate.   Signed Baird Lyons M.D. [place], [employer]  ELECTRONICALLY  SIGNED ON:  02/05/2014, 9:22 AM [place] PH: [phone]   FX: [phone] [place]

## 2014-06-30 ENCOUNTER — Emergency Department (INDEPENDENT_AMBULATORY_CARE_PROVIDER_SITE_OTHER): Payer: PRIVATE HEALTH INSURANCE

## 2014-06-30 ENCOUNTER — Emergency Department
Admission: EM | Admit: 2014-06-30 | Discharge: 2014-06-30 | Disposition: A | Discharge status: Home / Self Care | Payer: PRIVATE HEALTH INSURANCE | Source: Home / Self Care | Attending: Emergency Medicine | Admitting: Emergency Medicine

## 2014-06-30 ENCOUNTER — Encounter: Payer: Self-pay | Admitting: Emergency Medicine

## 2014-06-30 DIAGNOSIS — J209 Acute bronchitis, unspecified: Secondary | ICD-10-CM

## 2014-06-30 DIAGNOSIS — R05 Cough: Secondary | ICD-10-CM

## 2014-06-30 DIAGNOSIS — R509 Fever, unspecified: Secondary | ICD-10-CM

## 2014-06-30 MED ORDER — AZITHROMYCIN 250 MG PO TABS: ORAL_TABLET | ORAL | Status: DC

## 2014-06-30 MED ORDER — IPRATROPIUM-ALBUTEROL 0.5-2.5 (3) MG/3ML IN SOLN
3.0000 mL | RESPIRATORY_TRACT | Status: AC
  Administered 2014-06-30: 3 mL via RESPIRATORY_TRACT

## 2014-06-30 NOTE — ED Notes (Signed)
Cough and chest tightness started 6 days ago, fever started yesterday, hears gurgling in chest

## 2014-06-30 NOTE — ED Provider Notes (Signed)
CSN: [id number]     Arrival date & time 06/30/14  1248 History   First MD Initiated Contact with Patient 06/30/14 1258     Chief Complaint  Patient presents with  . Cough   (Consider location/radiation/quality/duration/timing/severity/associated sxs/prior Treatment) HPI URI HISTORY  Arvel is a 48 y.o. male who complains of onset of cold and chest congestion symptoms for 6 days.--Progressively worsening, much worse yesterday with fever to 102 and chills and cough productive of yellow sputum with wheezing.  Have been using ibuprofen which helps minimally, but it did lower fever somewhat.--He feels some gurgling in his chest that it improves somewhat with coughing.  No history of chronic lung disease or pneumonia. Positive past medical history of diabetes, he states is controlled on metformin, last A1c at his PCP was 6.0. He denies hypoglycemia symptoms  Positive chills/sweats +  Fever  +  Nasal congestion +  Minimal Discolored Post-nasal drainage No sinus pain/pressure No sore throat  +  Hacking, wheezing, productive cough Positive wheezing Positive chest congestion No hemoptysis Minimal shortness of breath on exertion. No exertional chest pain, but right and left lateral chest hurts when he coughs . No pleuritic pain  No itchy/red eyes No earache  No nausea No vomiting No abdominal pain No diarrhea  No skin rashes +  Fatigue Mild diffuse myalgias No headache . Denies focal neurologic symptoms  Past Medical History  Diagnosis Date  . Hyperlipidemia   . Morbid obesity   . Gastritis   . Palpitations   . AV block   . HTN (hypertension)   . Normal coronary angiogram 2004  . Cervical radicular pain   . Thoracic spine pain   . Radiculitis of leg   . Sleep apnea   . Diverticulitis of colon    Past Surgical History  Procedure Laterality Date  . Cardiac catheterization  11/17/2002    NORMAL. EF 65%   Family History  Problem Relation Age of Onset  . Multiple  sclerosis Mother   . Hypertension Father   . Cancer Father    History  Substance Use Topics  . Smoking status: Never Smoker   . Smokeless tobacco: Never Used  . Alcohol Use: No    Review of Systems  All other systems reviewed and are negative.   Allergies  Hydrocodone and Penicillins  Home Medications   Prior to Admission medications   Medication Sig Start Date End Date Taking? Authorizing Provider  azithromycin (ZITHROMAX Z-PAK) 250 MG tablet Take 2 tablets on day one, then 1 tablet daily on days 2 through 5 06/30/14   Jacqulyn Cane, MD  metFORMIN (GLUCOPHAGE-XR) 500 MG 24 hr tablet Take 500 mg by mouth daily with breakfast.    Historical Provider, MD  minocycline (MINOCIN,DYNACIN) 100 MG capsule Take 100 mg by mouth 2 (two) times daily.    Historical Provider, MD  Ranitidine HCl (ZANTAC PO) Take by mouth.      Historical Provider, MD  Vitamin D, Ergocalciferol, (DRISDOL) 50000 UNITS CAPS capsule  08/09/13   Historical Provider, MD   BP 138/87  Pulse 91  Temp(Src) 98.4 F (36.9 C) (Oral)  Ht 5\' 8"  (1.727 m)  Wt 233 lb (105.688 kg)  BMI 35.44 kg/m2  SpO2 98% Physical Exam  Nursing note and vitals reviewed. Constitutional: He is oriented to person, place, and time. He appears well-developed and well-nourished. No distress.  Appears ill, but nontoxic. Appears very fatigued. He can ambulate on his own.  HENT:  Head: Normocephalic and atraumatic.  Right Ear: Tympanic membrane normal.  Left Ear: Tympanic membrane normal.  Nose: Nose normal.  Mouth/Throat: Oropharynx is clear and moist. No oropharyngeal exudate.  Eyes: Right eye exhibits no discharge. Left eye exhibits no discharge. No scleral icterus.  Neck: Neck supple.  Cardiovascular: Normal rate, regular rhythm and normal heart sounds.   Pulmonary/Chest: No respiratory distress. He has no decreased breath sounds. He has wheezes (mild late expiratory throughout). He has rhonchi (Throughout). He has rales (mild bibasilar  crackles, decreased somewhat after coughing.).  Pulse ox 98% on room air  Lymphadenopathy:    He has no cervical adenopathy.  Neurological: He is alert and oriented to person, place, and time.  Skin: Skin is warm and dry. No rash noted. No cyanosis.  Psychiatric: He has a normal mood and affect.    ED Course  Procedures (including critical care time) Labs Review Labs Reviewed - No data to display  Imaging Review Dg Chest 2 View  06/30/2014   CLINICAL DATA:  Cough, fever.  EXAM: CHEST  2 VIEW  COMPARISON:  June 24, 2013.  FINDINGS: The heart size and mediastinal contours are within normal limits. Both lungs are clear. No pneumothorax or pleural effusion is noted. The visualized skeletal structures are unremarkable.  IMPRESSION: No acute cardiopulmonary abnormality seen.   Electronically Signed   By: Sabino Dick M.D.   On: 06/30/2014 13:40     MDM   1. Acute bronchitis, unspecified organism    Chest x-ray shows no acute abnormality. No infiltrates . Treatment options discussed, as well as risks, benefits, alternatives. Patient voiced understanding and agreement with the following plans: DuoNeb nebulizer treatment. Recheck afterward and wheezing improved. Z-Pak prescribed with one refill if needed He declined any prescription cough meds. We discussed other OTC symptomatic care for cough and for pain and fever relief. Follow-up with your primary care doctor in 5-7 days if not improving, or sooner if symptoms become worse. Precautions discussed. Red flags discussed. Questions invited and answered. Patient voiced understanding and agreement.     Jacqulyn Cane, MD 06/30/14 1356

## 2014-07-02 ENCOUNTER — Emergency Department (HOSPITAL_COMMUNITY): Payer: PRIVATE HEALTH INSURANCE

## 2014-07-02 ENCOUNTER — Encounter (HOSPITAL_COMMUNITY): Payer: Self-pay | Admitting: Emergency Medicine

## 2014-07-02 ENCOUNTER — Inpatient Hospital Stay (HOSPITAL_COMMUNITY)
Admission: EM | Admit: 2014-07-02 | Discharge: 2014-07-04 | DRG: 871 | Disposition: A | Discharge status: Home / Self Care | Payer: PRIVATE HEALTH INSURANCE | Attending: Internal Medicine | Admitting: Internal Medicine

## 2014-07-02 DIAGNOSIS — E118 Type 2 diabetes mellitus with unspecified complications: Secondary | ICD-10-CM

## 2014-07-02 DIAGNOSIS — I10 Essential (primary) hypertension: Secondary | ICD-10-CM | POA: Diagnosis present

## 2014-07-02 DIAGNOSIS — A419 Sepsis, unspecified organism: Principal | ICD-10-CM | POA: Diagnosis present

## 2014-07-02 DIAGNOSIS — J181 Lobar pneumonia, unspecified organism: Secondary | ICD-10-CM

## 2014-07-02 DIAGNOSIS — I159 Secondary hypertension, unspecified: Secondary | ICD-10-CM

## 2014-07-02 DIAGNOSIS — G510 Bell's palsy: Secondary | ICD-10-CM

## 2014-07-02 DIAGNOSIS — E119 Type 2 diabetes mellitus without complications: Secondary | ICD-10-CM

## 2014-07-02 DIAGNOSIS — R200 Anesthesia of skin: Secondary | ICD-10-CM

## 2014-07-02 DIAGNOSIS — I4430 Unspecified atrioventricular block: Secondary | ICD-10-CM | POA: Diagnosis present

## 2014-07-02 DIAGNOSIS — Z6835 Body mass index (BMI) 35.0-35.9, adult: Secondary | ICD-10-CM | POA: Diagnosis not present

## 2014-07-02 DIAGNOSIS — G4730 Sleep apnea, unspecified: Secondary | ICD-10-CM | POA: Diagnosis present

## 2014-07-02 DIAGNOSIS — Z79899 Other long term (current) drug therapy: Secondary | ICD-10-CM

## 2014-07-02 DIAGNOSIS — R002 Palpitations: Secondary | ICD-10-CM

## 2014-07-02 DIAGNOSIS — E785 Hyperlipidemia, unspecified: Secondary | ICD-10-CM | POA: Diagnosis present

## 2014-07-02 DIAGNOSIS — J189 Pneumonia, unspecified organism: Secondary | ICD-10-CM | POA: Diagnosis present

## 2014-07-02 DIAGNOSIS — Z9189 Other specified personal risk factors, not elsewhere classified: Secondary | ICD-10-CM

## 2014-07-02 DIAGNOSIS — R509 Fever, unspecified: Secondary | ICD-10-CM | POA: Diagnosis present

## 2014-07-02 DIAGNOSIS — J330 Polyp of nasal cavity: Secondary | ICD-10-CM

## 2014-07-02 LAB — CREATININE, SERUM
Creatinine, Ser: 0.71 mg/dL (ref 0.50–1.35)
GFR calc Af Amer: >90 mL/min (ref >90)
GFR calc non Af Amer: >90 mL/min (ref >90)

## 2014-07-02 LAB — CBC
HCT: 42.9 % (ref 39.0–52.0)
HCT: 39.2 % (ref 39.0–52.0)
Hemoglobin: 15.2 g/dL (ref 13.0–17.0)
Hemoglobin: 13.7 g/dL (ref 13.0–17.0)
MCH: 33.5 pg (ref 26.0–34.0)
MCH: 32.9 pg (ref 26.0–34.0)
MCHC: 35.4 g/dL (ref 30.0–36.0)
MCHC: 34.9 g/dL (ref 30.0–36.0)
MCV: 94.5 fL (ref 78.0–100.0)
MCV: 94.2 fL (ref 78.0–100.0)
Platelets: 165 10*3/uL (ref 150–400)
Platelets: 170 10*3/uL (ref 150–400)
RBC: 4.54 MIL/uL (ref 4.22–5.81)
RBC: 4.16 MIL/uL — ABNORMAL LOW (ref 4.22–5.81)
RDW: 12.8 % (ref 11.5–15.5)
RDW: 12.9 % (ref 11.5–15.5)
WBC: 10.9 10*3/uL — ABNORMAL HIGH (ref 4.0–10.5)
WBC: 9.8 10*3/uL (ref 4.0–10.5)

## 2014-07-02 LAB — URINALYSIS, ROUTINE W REFLEX MICROSCOPIC
Bilirubin Urine: NEGATIVE
Glucose, UA: NEGATIVE mg/dL
Hgb urine dipstick: NEGATIVE
Ketones, ur: 15 mg/dL — AB
Leukocytes, UA: NEGATIVE
Nitrite: NEGATIVE
Protein, ur: NEGATIVE mg/dL
Specific Gravity, Urine: 1.028 (ref 1.005–1.030)
Urobilinogen, UA: 1 mg/dL (ref 0.0–1.0)
pH: 7 (ref 5.0–8.0)

## 2014-07-02 LAB — INFLUENZA PANEL BY PCR (TYPE A & B)
H1N1 flu by pcr: NOT DETECTED
Influenza A By PCR: NEGATIVE
Influenza B By PCR: NEGATIVE

## 2014-07-02 LAB — BASIC METABOLIC PANEL
Anion gap: 16 — ABNORMAL HIGH (ref 5–15)
BUN: 12 mg/dL (ref 6–23)
CO2: 22 mEq/L (ref 19–32)
Calcium: 9.3 mg/dL (ref 8.4–10.5)
Chloride: 97 mEq/L (ref 96–112)
Creatinine, Ser: 0.68 mg/dL (ref 0.50–1.35)
GFR calc Af Amer: >90 mL/min (ref >90)
GFR calc non Af Amer: >90 mL/min (ref >90)
Glucose, Bld: 165 mg/dL — ABNORMAL HIGH (ref 70–99)
Potassium: 4.2 mEq/L (ref 3.7–5.3)
Sodium: 135 mEq/L — ABNORMAL LOW (ref 137–147)

## 2014-07-02 LAB — GLUCOSE, CAPILLARY
Glucose-Capillary: 165 mg/dL — ABNORMAL HIGH (ref 70–99)
Glucose-Capillary: 150 mg/dL — ABNORMAL HIGH (ref 70–99)

## 2014-07-02 LAB — PRO B NATRIURETIC PEPTIDE: Pro B Natriuretic peptide (BNP): 26.5 pg/mL (ref 0–125)

## 2014-07-02 LAB — I-STAT TROPONIN, ED: Troponin i, poc: 0 ng/mL (ref 0.00–0.08)

## 2014-07-02 LAB — I-STAT CG4 LACTIC ACID, ED: Lactic Acid, Venous: 1.55 mmol/L (ref 0.5–2.2)

## 2014-07-02 LAB — STREP PNEUMONIAE URINARY ANTIGEN: Strep Pneumo Urinary Antigen: NEGATIVE

## 2014-07-02 MED ORDER — ENOXAPARIN SODIUM 40 MG/0.4ML ~~LOC~~ SOLN
40.0000 mg | SUBCUTANEOUS | Status: DC
  Administered 2014-07-03: 40 mg via SUBCUTANEOUS
  Filled 2014-07-02 (×2): qty 0.4

## 2014-07-02 MED ORDER — ACETAMINOPHEN 325 MG PO TABS
650.0000 mg | ORAL_TABLET | Freq: Four times a day (QID) | ORAL | Status: DC | PRN
  Administered 2014-07-02: 650 mg via ORAL
  Filled 2014-07-02: qty 2

## 2014-07-02 MED ORDER — LEVOFLOXACIN IN D5W 750 MG/150ML IV SOLN
750.0000 mg | Freq: Once | INTRAVENOUS | Status: DC
  Filled 2014-07-02: qty 150

## 2014-07-02 MED ORDER — SODIUM CHLORIDE 0.9 % IV SOLN: INTRAVENOUS | Status: AC

## 2014-07-02 MED ORDER — INSULIN ASPART 100 UNIT/ML ~~LOC~~ SOLN
0.0000 [IU] | Freq: Three times a day (TID) | SUBCUTANEOUS | Status: DC
  Administered 2014-07-02: 3 [IU] via SUBCUTANEOUS
  Administered 2014-07-03 – 2014-07-04 (×4): 2 [IU] via SUBCUTANEOUS

## 2014-07-02 MED ORDER — SODIUM CHLORIDE 0.9 % IV BOLUS (SEPSIS)
1000.0000 mL | Freq: Once | INTRAVENOUS | Status: AC
  Administered 2014-07-02: 1000 mL via INTRAVENOUS

## 2014-07-02 MED ORDER — LEVOFLOXACIN IN D5W 750 MG/150ML IV SOLN
750.0000 mg | INTRAVENOUS | Status: DC
  Administered 2014-07-02 – 2014-07-03 (×2): 750 mg via INTRAVENOUS
  Filled 2014-07-02 (×3): qty 150

## 2014-07-02 MED ORDER — SODIUM CHLORIDE 0.9 % IV SOLN
INTRAVENOUS | Status: DC
  Administered 2014-07-02 – 2014-07-03 (×2): 1000 mL via INTRAVENOUS
  Administered 2014-07-03 – 2014-07-04 (×2): via INTRAVENOUS

## 2014-07-02 MED ORDER — GUAIFENESIN ER 600 MG PO TB12
1200.0000 mg | ORAL_TABLET | Freq: Two times a day (BID) | ORAL | Status: DC
  Administered 2014-07-02 – 2014-07-04 (×4): 1200 mg via ORAL
  Filled 2014-07-02 (×5): qty 2

## 2014-07-02 NOTE — Progress Notes (Signed)
Patient trasfered from ED to 5W27 via stretcher; alert and oriented x 4; complaints of headache; IV saline locked in RAC; skin intact. Orient patient to room and unit; watch safety video; gave patient care guide; instructed how to use the call bell and  fall risk precautions. Will continue to monitor the patient.

## 2014-07-02 NOTE — ED Provider Notes (Signed)
Medical screening examination/treatment/procedure(s) were conducted as a shared visit with non-physician practitioner(s) and myself.  I personally evaluated the patient during the encounter.   EKG Interpretation None      Presents with progressive worsening cough and shortness of breath. Patient already on Zithromax for treatment of bronchitis. Symptoms worsening 2 days after initiating antibiotics. Patient is a pneumonia on x-ray and hypoxia in the ER. Will require hospitalization for further management of community acquired pneumonia with failure of outpatient therapy.  Orpah Greek, MD 07/02/14 [phone]

## 2014-07-02 NOTE — ED Provider Notes (Signed)
CSN: [id number]     Arrival date & time 07/02/14  1205 History   First MD Initiated Contact with Patient 07/02/14 1218     Chief Complaint  Patient presents with  . Chest Pain  . Fever   Patient is a 48 y.o. male presenting with chest pain and fever.  Chest Pain Associated symptoms: abdominal pain (epigastric pain), cough, diaphoresis, fatigue, fever, headache and shortness of breath   Associated symptoms: no dizziness, no nausea, no palpitations and not vomiting   Fever Associated symptoms: chest pain, chills, cough, headaches and myalgias   Associated symptoms: no diarrhea, no dysuria, no nausea and no vomiting     Patient is a 48 y.o. Male who presents to the ED with fever, productive cough, and shortness of breath x 8 days.  Patient states that for approximately the past week he has been having cough, chest tightness, and shortness of breath.  In the past 2 days he feels that his cough has become productive with thick dark green sputum.  Patient was seen at urgent care and was diagnosed with bronchitis and was given a z pack.  Patient states that he has been taking this medication as prescribed for the past two days with no relief.  Patient has also been taking ibuprofen for fevers which have been as high as 102 with temporary relief.   Past Medical History  Diagnosis Date  . Hyperlipidemia   . Morbid obesity   . Gastritis   . Palpitations   . AV block   . HTN (hypertension)   . Normal coronary angiogram 2004  . Cervical radicular pain   . Thoracic spine pain   . Radiculitis of leg   . Sleep apnea   . Diverticulitis of colon    Past Surgical History  Procedure Laterality Date  . Cardiac catheterization  11/17/2002    NORMAL. EF 65%   Family History  Problem Relation Age of Onset  . Multiple sclerosis Mother   . Hypertension Father   . Cancer Father    History  Substance Use Topics  . Smoking status: Never Smoker   . Smokeless tobacco: Never Used  . Alcohol Use: No     Review of Systems  Constitutional: Positive for fever, chills, diaphoresis and fatigue.  Respiratory: Positive for cough, chest tightness, shortness of breath and wheezing.   Cardiovascular: Positive for chest pain. Negative for palpitations and leg swelling.  Gastrointestinal: Positive for abdominal pain (epigastric pain). Negative for nausea, vomiting, diarrhea and constipation.  Genitourinary: Negative for dysuria, urgency, frequency and hematuria.  Musculoskeletal: Positive for myalgias.  Neurological: Positive for headaches. Negative for dizziness.  All other systems reviewed and are negative.     Allergies  Hydrocodone and Penicillins  Home Medications   Prior to Admission medications   Medication Sig Start Date End Date Taking? Authorizing Provider  azithromycin (ZITHROMAX Z-PAK) 250 MG tablet Take 2 tablets on day one, then 1 tablet daily on days 2 through 5 06/30/14  Yes Jacqulyn Cane, MD  ibuprofen (ADVIL,MOTRIN) 200 MG tablet Take 800 mg by mouth every 6 (six) hours as needed for moderate pain.   Yes Historical Provider, MD  metformin (FORTAMET) 1000 MG (OSM) 24 hr tablet Take 1,000 mg by mouth daily with breakfast.   Yes Historical Provider, MD  oxymetazoline (AFRIN) 0.05 % nasal spray Place 1 spray into both nostrils once.   Yes Historical Provider, MD   BP 147/87  Pulse 79  Temp(Src) 102.4 F (39.1 C) (  Oral)  Resp 22  SpO2 98% Physical Exam  Nursing note and vitals reviewed. Constitutional: He is oriented to person, place, and time. He appears well-developed and well-nourished. No distress.  HENT:  Head: Normocephalic and atraumatic.  Nose: Mucosal edema present.  Mouth/Throat: Uvula is midline. Mucous membranes are dry. Posterior oropharyngeal erythema present. No oropharyngeal exudate or posterior oropharyngeal edema.  Eyes: Conjunctivae and EOM are normal. Pupils are equal, round, and reactive to light. No scleral icterus.  Neck: Normal range of motion.  Neck supple. No JVD present. No thyromegaly present.  Cardiovascular: Normal rate, regular rhythm, normal heart sounds and intact distal pulses.  Exam reveals no gallop and no friction rub.   No murmur heard. Pulmonary/Chest: Effort normal. No respiratory distress. He has no wheezes. He has rales (faint in left lower lobe). He exhibits no tenderness.  Abdominal: Soft. Bowel sounds are normal. He exhibits no distension and no mass. There is no tenderness. There is no rebound and no guarding.  Musculoskeletal: Normal range of motion.  Lymphadenopathy:    He has cervical adenopathy.  Neurological: He is alert and oriented to person, place, and time. He has normal strength. No cranial nerve deficit or sensory deficit. Coordination normal.  Skin: Skin is warm and dry. He is not diaphoretic.  Psychiatric: He has a normal mood and affect. His behavior is normal. Judgment and thought content normal.    ED Course  Procedures (including critical care time) Labs Review Labs Reviewed  CBC - Abnormal; Notable for the following:    WBC 10.9 (*)    All other components within normal limits  BASIC METABOLIC PANEL - Abnormal; Notable for the following:    Sodium 135 (*)    Glucose, Bld 165 (*)    Anion gap 16 (*)    All other components within normal limits  PRO B NATRIURETIC PEPTIDE  URINALYSIS, ROUTINE W REFLEX MICROSCOPIC  I-STAT CG4 LACTIC ACID, ED  I-STAT TROPOININ, ED    Imaging Review Dg Chest 2 View  07/02/2014   CLINICAL DATA:  Chest pain and fever for 4 days.  Diabetes.  EXAM: CHEST  2 VIEW  COMPARISON:  None.  FINDINGS: Focal area of airspace opacity is seen in the posterior left lower lobe, consistent with pneumonia. No evidence of pleural effusion. Heart size and mediastinal contours are within normal limits.  IMPRESSION: Focal area of airspace opacity in the posterior left lower lobe, suspicious for pneumonia. Recommend chest radiographic followup in several weeks to confirm  resolution.   Electronically Signed   By: Earle Gell M.D.   On: 07/02/2014 14:01     EKG Interpretation None      MDM   Final diagnoses:  LLL pneumonia  Sepsis, due to unspecified organism   Patient is a 48 y.o. Male who presents to the ED with fever, productive cough, and shortness of breath.  Patient is febrile and tachypnic on arrival.  Physical exam reveals rales in the left lower lobe.  CXR today reveals LLL pneumonia.  CBC shows mild leukocytosis.  BNP is negative.  UA is pending.  Lactic acid and troponin negative.  Patient was ambulated here with pulse ox of 90%.  Patient has been on azithromycin for 2 days and is currently failing outpatient therapy.  Will consult triad for admission at this time.  I have spoken with Dr. Ree Kida who will admit the patient to medsurg.  Will start on levaquin.  Patient has been seen by and discussed with Dr.  Pollina who agrees with the above plan and workup.       Cherylann Parr, PA-C 07/02/14 1434

## 2014-07-02 NOTE — ED Notes (Signed)
Patient transported to X-ray 

## 2014-07-02 NOTE — ED Notes (Signed)
sats sitting at bedside 99% RA. Walked pt around nursing unit, sat dropped to 90% RA. Finding reported to Dr. Betsey Holiday.

## 2014-07-02 NOTE — Progress Notes (Addendum)
Per MD, Cristal Ford phone order, no cardiac monitor and continuous pulse ox needed.

## 2014-07-02 NOTE — ED Notes (Signed)
Pt reports that he was told last week when the symptoms started that he has bronchitis and given a Z pak. States that he has taken the Z pak but feels no better. States that he has had a fever of 102 at home. States that he has been taking ibuprofen at home but the fever keeps returning. Increased cough and fatigue at home.

## 2014-07-02 NOTE — H&P (Signed)
[facility] History and Physical  BRAXTON VANTREASE WUJ:[id number] DOB: 08-30-1966 DOA: 07/02/2014  Referring physician:  PCP: London Pepper, MD  Specialists:     Chief Complaint: Fever, Chills, Cough  HPI: Robert Schultz is a 48 y.o. male  With a history of diabetes mellitus, type II, hypertension, hyperlipidemia, that presented to the emergency department for complaints of fever, chills, cough. Patient states this has been ongoing for approximately one week however 2 days before being admitted, patient began to have a productive cough. He stated the sputum was dark green in color and thick. Patient presented to an urgent care and was given azithromycin and sent home. X-ray at the urgent care showed no acute cardiopulmonary process. Patient continued to feel fatigued as well as have cough. Patient then came to the emergency department. In the emergency department, chest x-ray did show a left lower lobe pneumonia. Patient also complained of some myalgias as well as decreased appetite. He denied any recent travel, or ill contacts.  Review of Systems:  Constitutional: Patient complains of fever, chills, fatigue. HEENT: Denies photophobia, eye pain, redness, hearing loss, ear pain, congestion, sore throat, rhinorrhea, sneezing, mouth sores, trouble swallowing, neck pain, neck stiffness and tinnitus.   Respiratory: Patient complains of cough as well as shortness of breath Cardiovascular: Denies chest pain, palpitations and leg swelling.  Gastrointestinal: Denies nausea, vomiting, abdominal pain, diarrhea, constipation, blood in stool and abdominal distention.  Genitourinary: Denies dysuria, urgency, frequency, hematuria, flank pain and difficulty urinating.  Musculoskeletal: Complains of myalgias. Skin: Denies pallor, rash and wound.  Neurological: Denies dizziness, seizures, syncope, weakness, light-headedness, numbness and headaches.  Hematological: Denies adenopathy. Easy bruising,  personal or family bleeding history  Psychiatric/Behavioral: Denies suicidal ideation, mood changes, confusion, nervousness, sleep disturbance and agitation  Past Medical History  Diagnosis Date  . Hyperlipidemia   . Morbid obesity   . Gastritis   . Palpitations   . AV block   . HTN (hypertension)   . Normal coronary angiogram 2004  . Cervical radicular pain   . Thoracic spine pain   . Radiculitis of leg   . Sleep apnea   . Diverticulitis of colon    Past Surgical History  Procedure Laterality Date  . Cardiac catheterization  11/17/2002    NORMAL. EF 65%   Social History:  reports that he has never smoked. He has never used smokeless tobacco. He reports that he does not drink alcohol or use illicit drugs.   Allergies  Allergen Reactions  . Hydrocodone Other (See Comments)    "I break into a violent sweat--fast."  . Penicillins Hives    Family History  Problem Relation Age of Onset  . Multiple sclerosis Mother   . Hypertension Father   . Cancer Father      Prior to Admission medications   Medication Sig Start Date End Date Taking? Authorizing Provider  azithromycin (ZITHROMAX Z-PAK) 250 MG tablet Take 2 tablets on day one, then 1 tablet daily on days 2 through 5 06/30/14  Yes Jacqulyn Cane, MD  ibuprofen (ADVIL,MOTRIN) 200 MG tablet Take 800 mg by mouth every 6 (six) hours as needed for moderate pain.   Yes Historical Provider, MD  metformin (FORTAMET) 1000 MG (OSM) 24 hr tablet Take 1,000 mg by mouth daily with breakfast.   Yes Historical Provider, MD  oxymetazoline (AFRIN) 0.05 % nasal spray Place 1 spray into both nostrils once.   Yes Historical Provider, MD   Physical Exam: Filed Vitals:   07/02/14  1455  BP:   Pulse: 96  Temp:   Resp: 19     General: Well developed, well nourished, NAD, appears stated age  HEENT: NCAT, PERRLA, EOMI, Anicteic Sclera, mucous membranes dry  Cardiovascular: S1 S2 auscultated, no rubs, murmurs or gallops. Regular rate and  rhythm.  Respiratory: Diminished breath sounds in left lower lobe otherwise clear, no wheezes or rhonchi  Abdomen: Soft, nontender, nondistended, + bowel sounds  Extremities: warm dry without cyanosis clubbing or edema  Neuro: AAOx3, cranial nerves grossly intact. Strength 5/5 in patient's upper and lower extremities bilaterally  Skin: Slight skin discoloration noted on the shins bilaterally.  Psych: Normal affect and demeanor with intact judgement and insight  Labs on Admission:  Basic Metabolic Panel:  Recent Labs Lab 07/02/14 1224  NA 135*  K 4.2  CL 97  CO2 22  GLUCOSE 165*  BUN 12  CREATININE 0.68  CALCIUM 9.3   Liver Function Tests: No results found for this basename: AST, ALT, ALKPHOS, BILITOT, PROT, ALBUMIN,  in the last 168 hours No results found for this basename: LIPASE, AMYLASE,  in the last 168 hours No results found for this basename: AMMONIA,  in the last 168 hours CBC:  Recent Labs Lab 07/02/14 1224  WBC 10.9*  HGB 15.2  HCT 42.9  MCV 94.5  PLT 165   Cardiac Enzymes: No results found for this basename: CKTOTAL, CKMB, CKMBINDEX, TROPONINI,  in the last 168 hours  BNP (last 3 results)  Recent Labs  07/02/14 1224  PROBNP 26.5   CBG: No results found for this basename: GLUCAP,  in the last 168 hours  Radiological Exams on Admission: Dg Chest 2 View  07/02/2014   CLINICAL DATA:  Chest pain and fever for 4 days.  Diabetes.  EXAM: CHEST  2 VIEW  COMPARISON:  None.  FINDINGS: Focal area of airspace opacity is seen in the posterior left lower lobe, consistent with pneumonia. No evidence of pleural effusion. Heart size and mediastinal contours are within normal limits.  IMPRESSION: Focal area of airspace opacity in the posterior left lower lobe, suspicious for pneumonia. Recommend chest radiographic followup in several weeks to confirm resolution.   Electronically Signed   By: Earle Gell M.D.   On: 07/02/2014 14:01    EKG:  None  Assessment/Plan  Sepsis secondary to community-acquired pneumonia -Patient be admitted to medical floor -Upon admission, patient was febrile with mild leukocytosis as well as tachycardia and tachypnea. -Chest x-ray: Focal area of airspace opacity in the posterior left lower lobe suspicious for pneumonia -Patient outpatient treatment with azithromycin -Will place patient on IV Levaquin as well as IV fluids, and guaifenesin -Patient currently does not have any shortness of breath or wheezing -Will obtain sputum culture and Gram stain if possible, urine Legionella and strep pneumonia antigen -Will also test for Flu given his myalgias and fever  Diabetes mellitus, type II -Will hold metformin -Patient's last hemoglobin A1c was 6.1 -Will place him on insulin sliding scale along with CBG monitoring  History of hyperlipidemia and hypertension -Patient currently not on any statin medications as following diet and lifestyle modifications  DVT prophylaxis:  Lovenox  Code Status: Full  Condition: Guarded  Family Communication: Daughter at bedside. Admission, patients condition and plan of care including tests being ordered have been discussed with the patient and father who indicate understanding and agree with the plan and Code Status.  Disposition Plan: Admitted   Time spent: 60 minutes  Adea Geisel D.O. [facility] Pager [phone]  If 7PM-7AM, please contact night-coverage [email or web address] Password [email or web address] 07/02/2014, 3:07 PM

## 2014-07-02 NOTE — ED Notes (Signed)
Pt returned to room from CT

## 2014-07-02 NOTE — ED Notes (Addendum)
Pt states he has off and on chest pain x 1 week.  Left sides tightness that radiates to upper left back.  States he feels like he has acid reflux 3 in lower in his chest from the pain in chest. Pt also states he started a z-pac Thursday for bronchitis.

## 2014-07-02 NOTE — ED Notes (Signed)
Patient asked for urine sample. He states that he is unable to give sample at this time 

## 2014-07-03 LAB — CBC
HCT: 39.7 % (ref 39.0–52.0)
Hemoglobin: 13.5 g/dL (ref 13.0–17.0)
MCH: 32.1 pg (ref 26.0–34.0)
MCHC: 34 g/dL (ref 30.0–36.0)
MCV: 94.5 fL (ref 78.0–100.0)
Platelets: 154 10*3/uL (ref 150–400)
RBC: 4.2 MIL/uL — ABNORMAL LOW (ref 4.22–5.81)
RDW: 12.9 % (ref 11.5–15.5)
WBC: 10.1 10*3/uL (ref 4.0–10.5)

## 2014-07-03 LAB — BASIC METABOLIC PANEL
Anion gap: 15 (ref 5–15)
BUN: 12 mg/dL (ref 6–23)
CO2: 19 mEq/L (ref 19–32)
Calcium: 8.7 mg/dL (ref 8.4–10.5)
Chloride: 100 mEq/L (ref 96–112)
Creatinine, Ser: 0.73 mg/dL (ref 0.50–1.35)
GFR calc Af Amer: >90 mL/min (ref >90)
GFR calc non Af Amer: >90 mL/min (ref >90)
Glucose, Bld: 128 mg/dL — ABNORMAL HIGH (ref 70–99)
Potassium: 4.3 mEq/L (ref 3.7–5.3)
Sodium: 134 mEq/L — ABNORMAL LOW (ref 137–147)

## 2014-07-03 LAB — GLUCOSE, CAPILLARY
Glucose-Capillary: 144 mg/dL — ABNORMAL HIGH (ref 70–99)
Glucose-Capillary: 137 mg/dL — ABNORMAL HIGH (ref 70–99)
Glucose-Capillary: 130 mg/dL — ABNORMAL HIGH (ref 70–99)
Glucose-Capillary: 146 mg/dL — ABNORMAL HIGH (ref 70–99)

## 2014-07-03 LAB — HIV ANTIBODY (ROUTINE TESTING W REFLEX): HIV 1&2 Ab, 4th Generation: NONREACTIVE

## 2014-07-03 LAB — LEGIONELLA ANTIGEN, URINE

## 2014-07-03 NOTE — Progress Notes (Signed)
[facility]                                                                              Patient Demographics  Robert Schultz, is a 48 y.o. male, DOB - 04/04/1966, KVQ:[id number]  Admit date - 07/02/2014   Admitting Physician Robert Ford, DO  Outpatient Primary MD for the patient is Robert Pepper, MD  LOS - 1   Chief Complaint  Patient presents with  . Chest Pain  . Fever      HPI Robert Schultz is a 48 y.o. male with a history of diabetes mellitus, type II, hypertension, hyperlipidemia, that presented to the emergency department for complaints of fever, chills, cough. Patient stated this has been ongoing for approximately one week however 2 days before being admitted, patient began to have a productive cough. He stated the sputum was dark green in color and thick. Patient presented to an urgent care and was given azithromycin and sent home. X-ray at the urgent care showed no acute cardiopulmonary process. Patient continued to feel fatigued as well as have cough. Patient then came to the emergency department. In the emergency department, chest x-ray did show a left lower lobe pneumonia. Patient also complained of some myalgias as well as decreased appetite. He denied any recent travel, or ill contacts.   Assessment & Plan   Sepsis secondary to community-acquired pneumonia  -Upon admission, patient was febrile with mild leukocytosis as well as tachycardia and tachypnea.  -Chest x-ray: Focal area of airspace opacity in the posterior left lower lobe suspicious for pneumonia  -Patient failed outpatient treatment with azithromycin  -Continue IV Levaquin as well as IV fluids, and guaifenesin  -Patient currently does not have any shortness of breath or wheezing  -Strep pneumonia urine Ag negative, influenza PCR negative -Sputum culture and gram stain and Legionella urine Ag pending  Diabetes mellitus, type II  -Metformin held -Patient's last hemoglobin A1c was 6.1  -Continue  insulin sliding scale along with CBG monitoring   History of hyperlipidemia and hypertension  -Patient currently not on any statin medications as following diet and lifestyle modifications  Code Status: Full  Family Communication: Father at bedside  Disposition Plan: Admitted  Time Spent in minutes   30 minutes  Procedures  None  Consults   None  DVT Prophylaxis  Lovenox  Lab Results  Component Value Date   PLT 154 07/03/2014    Medications  Scheduled Meds: . sodium chloride   Intravenous STAT  . enoxaparin (LOVENOX) injection  40 mg Subcutaneous Q24H  . guaiFENesin  1,200 mg Oral BID  . insulin aspart  0-15 Units Subcutaneous TID WC  . levofloxacin (LEVAQUIN) IV  750 mg Intravenous Q24H   Continuous Infusions: . sodium chloride 100 mL/hr at 07/03/14 0435   PRN Meds:.acetaminophen  Antibiotics    Anti-infectives   Start     Dose/Rate Route Frequency Ordered Stop   07/02/14 1800  levofloxacin (LEVAQUIN) IVPB 750 mg  Status:  Discontinued     750 mg 100 mL/hr over 90 Minutes Intravenous  Once 07/02/14 1638 07/02/14 1643   07/02/14 1800  levofloxacin (LEVAQUIN) IVPB 750 mg     750 mg 100  mL/hr over 90 Minutes Intravenous Every 24 hours 07/02/14 1638 07/07/14 1759        Subjective:   Robert Schultz seen and examined today.  Patient states he is feeling better today. He states his cough has also improved.  He denies nausea, vomiting, chest pain, SOB, abdominal pain.  Objective:   Filed Vitals:   07/02/14 1824 07/02/14 2101 07/03/14 0535 07/03/14 0609  BP:  147/75 120/55 115/66  Pulse:  93 91 74  Temp: 99.3 F (37.4 C) 98 F (36.7 C) 99 F (37.2 C)   TempSrc: Oral Oral Oral   Resp:  20 20   Height:      Weight:      SpO2:  97% 96%     Wt Readings from Last 3 Encounters:  07/02/14 105 kg (231 lb 7.7 oz)  06/30/14 105.688 kg (233 lb)  01/24/14 104.327 kg (230 lb)    No intake or output data in the 24 hours ending 07/03/14  0939  Exam  General: Well developed, well nourished, NAD, appears stated age  HEENT: NCAT, mucous membranes moist.   Cardiovascular: S1 S2 auscultated, no rubs, murmurs or gallops. Regular rate and rhythm.  Respiratory: Clear to auscultation bilaterally with equal chest rise  Abdomen: Soft, nontender, nondistended, + bowel sounds  Extremities: warm dry without cyanosis clubbing or edema  Neuro: AAOx3, no focal deficits  Skin: Slight skin discoloration noted on the shins bilaterally.  Psych: Normal affect and demeanor with intact judgement and insight  Data Review   Micro Results No results found for this or any previous visit (from the past 240 hour(s)).  Radiology Reports Dg Chest 2 View  07/02/2014   CLINICAL DATA:  Chest pain and fever for 4 days.  Diabetes.  EXAM: CHEST  2 VIEW  COMPARISON:  None.  FINDINGS: Focal area of airspace opacity is seen in the posterior left lower lobe, consistent with pneumonia. No evidence of pleural effusion. Heart size and mediastinal contours are within normal limits.  IMPRESSION: Focal area of airspace opacity in the posterior left lower lobe, suspicious for pneumonia. Recommend chest radiographic followup in several weeks to confirm resolution.   Electronically Signed   By: Earle Gell M.D.   On: 07/02/2014 14:01   Dg Chest 2 View  06/30/2014   CLINICAL DATA:  Cough, fever.  EXAM: CHEST  2 VIEW  COMPARISON:  June 24, 2013.  FINDINGS: The heart size and mediastinal contours are within normal limits. Both lungs are clear. No pneumothorax or pleural effusion is noted. The visualized skeletal structures are unremarkable.  IMPRESSION: No acute cardiopulmonary abnormality seen.   Electronically Signed   By: Sabino Dick M.D.   On: 06/30/2014 13:40    CBC  Recent Labs Lab 07/02/14 1224 07/02/14 1701 07/03/14 0540  WBC 10.9* 9.8 10.1  HGB 15.2 13.7 13.5  HCT 42.9 39.2 39.7  PLT 165 170 154  MCV 94.5 94.2 94.5  MCH 33.5 32.9 32.1  MCHC  35.4 34.9 34.0  RDW 12.8 12.9 12.9    Chemistries   Recent Labs Lab 07/02/14 1224 07/02/14 1701 07/03/14 0540  NA 135*  --  134*  K 4.2  --  4.3  CL 97  --  100  CO2 22  --  19  GLUCOSE 165*  --  128*  BUN 12  --  12  CREATININE 0.68 0.71 0.73  CALCIUM 9.3  --  8.7   ------------------------------------------------------------------------------------------------------------------ estimated creatinine clearance is 135.5 ml/min (by C-G  formula based on Cr of 0.73). ------------------------------------------------------------------------------------------------------------------ No results found for this basename: HGBA1C,  in the last 72 hours ------------------------------------------------------------------------------------------------------------------ No results found for this basename: CHOL, HDL, LDLCALC, TRIG, CHOLHDL, LDLDIRECT,  in the last 72 hours ------------------------------------------------------------------------------------------------------------------ No results found for this basename: TSH, T4TOTAL, FREET3, T3FREE, THYROIDAB,  in the last 72 hours ------------------------------------------------------------------------------------------------------------------ No results found for this basename: VITAMINB12, FOLATE, FERRITIN, TIBC, IRON, RETICCTPCT,  in the last 72 hours  Coagulation profile No results found for this basename: INR, PROTIME,  in the last 168 hours  No results found for this basename: DDIMER,  in the last 72 hours  Cardiac Enzymes No results found for this basename: CK, CKMB, TROPONINI, MYOGLOBIN,  in the last 168 hours ------------------------------------------------------------------------------------------------------------------ No components found with this basename: POCBNP,     Shaylah Mcghie D.O. on 07/03/2014 at 9:39 AM  Between 7am to 7pm - Pager - [phone]  After 7pm go to [email or web address] - password [email or web address]  And look for the  night coverage person covering for me after hours  [facility] Office  [phone]

## 2014-07-04 LAB — GLUCOSE, CAPILLARY
Glucose-Capillary: 125 mg/dL — ABNORMAL HIGH (ref 70–99)
Glucose-Capillary: 130 mg/dL — ABNORMAL HIGH (ref 70–99)

## 2014-07-04 MED ORDER — LEVOFLOXACIN 750 MG PO TABS: 750.0000 mg | ORAL_TABLET | Freq: Every day | ORAL | Status: DC

## 2014-07-04 MED ORDER — GUAIFENESIN ER 600 MG PO TB12: 1200.0000 mg | ORAL_TABLET | Freq: Two times a day (BID) | ORAL | Status: DC

## 2014-07-04 NOTE — Progress Notes (Signed)
CARE MANAGEMENT NOTE 07/04/2014  Patient:  Robert Schultz, Robert Schultz   Account Number:  [id number]  Date Initiated:  07/04/2014  Documentation initiated by:  [facility]  Subjective/Objective Assessment:   sepsis     Action/Plan:   Anticipated DC Date:  07/04/2014   Anticipated DC Plan:  [place]  CM consult      Choice offered to / List presented to:             Status of service:  Completed, signed off Medicare Important Message given?   (If response is "NO", the following Medicare IM given date fields will be blank) Date Medicare IM given:   Medicare IM given by:   Date Additional Medicare IM given:   Additional Medicare IM given by:    Discharge Disposition:  HOME/SELF CARE  Per UR Regulation:    If discussed at Long Length of Stay Meetings, dates discussed:    Comments:  No NCM needs identified. Pt states he is able to purchase his medications and has PCP. Jonnie Finner RN CCM Case Mgmt phone [phone]

## 2014-07-04 NOTE — Progress Notes (Signed)
Patient discharge teaching given, including activity, diet, follow-up appoints, and medications. Patient verbalized understanding of all discharge instructions. IV access was d/c'd. Vitals are stable. Skin is intact except as charted in most recent assessments. Pt to be escorted out by NT, to be driven home by family. 

## 2014-07-04 NOTE — Discharge Summary (Signed)
Physician Discharge Summary  Robert Schultz:[id number] DOB: 11-Feb-1966 DOA: 07/02/2014  PCP: Robert Pepper, MD  Admit date: 07/02/2014 Discharge date: 07/04/2014  Time spent: 45 minutes  Recommendations for Outpatient Follow-up:  The patient will be discharged to home. He is to follow up with his primary care physician within one week of discharge. Patient should continue a heart healthy/carb modified diet. Patient should continue medications as prescribed. Patient may return to normal activity as tolerated.  Discharge Diagnoses:  Sepsis secondary to community-acquired pneumonia Diabetes mellitus, type II History of hyperlipidemia and hypertension  Discharge Condition: Stable  Diet recommendation: Heart healthy/carb modified  Filed Weights   07/02/14 1653  Weight: 105 kg (231 lb 7.7 oz)    History of present illness:  Robert Schultz is a 48 y.o. male with a history of diabetes mellitus, type II, hypertension, hyperlipidemia, that presented to the emergency department for complaints of fever, chills, cough. Patient stated this has been ongoing for approximately one week however 2 days before being admitted, patient began to have a productive cough. He stated the sputum was dark green in color and thick. Patient presented to an urgent care and was given azithromycin and sent home. X-ray at the urgent care showed no acute cardiopulmonary process. Patient continued to feel fatigued as well as have cough. Patient then came to the emergency department. In the emergency department, chest x-ray did show a left lower lobe pneumonia. Patient also complained of some myalgias as well as decreased appetite. He denied any recent travel, or ill contacts.  Hospital Course:  Sepsis secondary to community-acquired pneumonia  -Upon admission, patient was febrile with mild leukocytosis as well as tachycardia and tachypnea.  -Chest x-ray: Focal area of airspace opacity in the posterior left lower  lobe suspicious for pneumonia  -Patient failed outpatient treatment with azithromycin  -Initially placed on IV Levaquin as well as IV fluids, and guaifenesin  -Patient currently does not have any shortness of breath or wheezing  -Strep pneumonia and legionella urine Ag negative, influenza PCR negative   Diabetes mellitus, type II  -Metformin held, but may restart at discharge  -Patient's last hemoglobin A1c was 6.1  -Was placed on insulin sliding scale along with CBG monitoring during hospitalization  History of hyperlipidemia and hypertension  -Patient currently not on any statin medications as following diet and lifestyle modifications   Procedures:  None  Consultations:  None  Discharge Exam: Filed Vitals:   07/04/14 0530  BP: 135/77  Pulse: 75  Temp: 99.4 F (37.4 C)  Resp: 18   Exam  General: Well developed, well nourished, NAD, appears stated age  HEENT: NCAT, mucous membranes moist.  Cardiovascular: S1 S2 auscultated, no rubs, murmurs or gallops. Regular rate and rhythm.  Respiratory: Clear to auscultation bilaterally with equal chest rise  Abdomen: Soft, nontender, nondistended, + bowel sounds  Extremities: warm dry without cyanosis clubbing or edema  Neuro: AAOx3, no focal deficits  Psych: Normal affect and demeanor with intact judgement and insight  Discharge Instructions      Discharge Instructions   Discharge instructions    Complete by:  As directed   The patient will be discharged to home. He is to follow up with his primary care physician within one week of discharge. Patient should continue a heart healthy/carb modified diet. Patient should continue medications as prescribed. Patient may return to normal activity as tolerated.            Medication List    STOP  taking these medications       azithromycin 250 MG tablet  Commonly known as:  ZITHROMAX Z-PAK     oxymetazoline 0.05 % nasal spray  Commonly known as:  AFRIN      TAKE these  medications       guaiFENesin 600 MG 12 hr tablet  Commonly known as:  MUCINEX  Take 2 tablets (1,200 mg total) by mouth 2 (two) times daily.     ibuprofen 200 MG tablet  Commonly known as:  ADVIL,MOTRIN  Take 800 mg by mouth every 6 (six) hours as needed for moderate pain.     levofloxacin 750 MG tablet  Commonly known as:  LEVAQUIN  Take 1 tablet (750 mg total) by mouth daily.     metformin 1000 MG (OSM) 24 hr tablet  Commonly known as:  FORTAMET  Take 1,000 mg by mouth daily with breakfast.       Allergies  Allergen Reactions  . Hydrocodone Other (See Comments)    "I break into a violent sweat--fast."  . Penicillins Hives   Follow-up Information   Follow up with Robert Pepper, MD. Schedule an appointment as soon as possible for a visit in 1 week. [facility] followup)    Specialty:  Family Medicine   Contact information:   [address] [place] [address] [phone]        The results of significant diagnostics from this hospitalization (including imaging, microbiology, ancillary and laboratory) are listed below for reference.    Significant Diagnostic Studies: Dg Chest 2 View  07/02/2014   CLINICAL DATA:  Chest pain and fever for 4 days.  Diabetes.  EXAM: CHEST  2 VIEW  COMPARISON:  None.  FINDINGS: Focal area of airspace opacity is seen in the posterior left lower lobe, consistent with pneumonia. No evidence of pleural effusion. Heart size and mediastinal contours are within normal limits.  IMPRESSION: Focal area of airspace opacity in the posterior left lower lobe, suspicious for pneumonia. Recommend chest radiographic followup in several weeks to confirm resolution.   Electronically Signed   By: Earle Gell M.D.   On: 07/02/2014 14:01   Dg Chest 2 View  06/30/2014   CLINICAL DATA:  Cough, fever.  EXAM: CHEST  2 VIEW  COMPARISON:  June 24, 2013.  FINDINGS: The heart size and mediastinal contours are within normal limits. Both lungs are  clear. No pneumothorax or pleural effusion is noted. The visualized skeletal structures are unremarkable.  IMPRESSION: No acute cardiopulmonary abnormality seen.   Electronically Signed   By: Sabino Dick M.D.   On: 06/30/2014 13:40    Microbiology: Recent Results (from the past 240 hour(s))  CULTURE, BLOOD (ROUTINE X 2)     Status: None   Collection Time    07/02/14  5:00 PM      Result Value Ref Range Status   Specimen Description BLOOD RIGHT ARM   Final   Special Requests     Final   Value: BOTTLES DRAWN AEROBIC AND ANAEROBIC 8CC BLUE,5CC RED   Culture  Setup Time     Final   Value: 07/02/2014 23:41     Performed at [facility]   Culture     Final   Value:        BLOOD CULTURE RECEIVED NO GROWTH TO DATE CULTURE WILL BE HELD FOR 5 DAYS BEFORE ISSUING A FINAL NEGATIVE REPORT     Performed at [facility]   Report Status  PENDING   Incomplete  CULTURE, BLOOD (ROUTINE X 2)     Status: None   Collection Time    07/02/14  5:07 PM      Result Value Ref Range Status   Specimen Description BLOOD LEFT ARM   Final   Special Requests BOTTLES DRAWN AEROBIC ONLY 10CC   Final   Culture  Setup Time     Final   Value: 07/02/2014 23:41     Performed at [facility]   Culture     Final   Value:        BLOOD CULTURE RECEIVED NO GROWTH TO DATE CULTURE WILL BE HELD FOR 5 DAYS BEFORE ISSUING A FINAL NEGATIVE REPORT     Performed at [facility]   Report Status PENDING   Incomplete     Labs: Basic Metabolic Panel:  Recent Labs Lab 07/02/14 1224 07/02/14 1701 07/03/14 0540  NA 135*  --  134*  K 4.2  --  4.3  CL 97  --  100  CO2 22  --  19  GLUCOSE 165*  --  128*  BUN 12  --  12  CREATININE 0.68 0.71 0.73  CALCIUM 9.3  --  8.7   Liver Function Tests: No results found for this basename: AST, ALT, ALKPHOS, BILITOT, PROT, ALBUMIN,  in the last 168 hours No results found for this basename: LIPASE, AMYLASE,  in the last 168 hours No results found for this  basename: AMMONIA,  in the last 168 hours CBC:  Recent Labs Lab 07/02/14 1224 07/02/14 1701 07/03/14 0540  WBC 10.9* 9.8 10.1  HGB 15.2 13.7 13.5  HCT 42.9 39.2 39.7  MCV 94.5 94.2 94.5  PLT 165 170 154   Cardiac Enzymes: No results found for this basename: CKTOTAL, CKMB, CKMBINDEX, TROPONINI,  in the last 168 hours BNP: BNP (last 3 results)  Recent Labs  07/02/14 1224  PROBNP 26.5   CBG:  Recent Labs Lab 07/03/14 0758 07/03/14 1209 07/03/14 1733 07/03/14 2139 07/04/14 0806  GLUCAP 144* 137* 130* 146* 125*       Signed:  Nhat Hearne  [facility] 07/04/2014, 9:25 AM

## 2014-07-04 NOTE — Progress Notes (Signed)
Utilization review completed.  

## 2014-07-04 NOTE — Discharge Instructions (Signed)

## 2014-07-08 LAB — CULTURE, BLOOD (ROUTINE X 2)
Culture: NO GROWTH
Culture: NO GROWTH

## 2014-08-24 ENCOUNTER — Ambulatory Visit
Admission: RE | Admit: 2014-08-24 | Discharge: 2014-08-24 | Disposition: A | Discharge status: Home / Self Care | Payer: PRIVATE HEALTH INSURANCE | Source: Ambulatory Visit | Attending: Family Medicine | Admitting: Family Medicine

## 2014-08-24 ENCOUNTER — Other Ambulatory Visit: Payer: Self-pay | Admitting: Family Medicine

## 2014-08-24 DIAGNOSIS — J189 Pneumonia, unspecified organism: Secondary | ICD-10-CM

## 2016-08-21 ENCOUNTER — Encounter: Payer: Self-pay | Admitting: *Deleted

## 2016-08-21 ENCOUNTER — Emergency Department (INDEPENDENT_AMBULATORY_CARE_PROVIDER_SITE_OTHER): Payer: BLUE CROSS/BLUE SHIELD

## 2016-08-21 ENCOUNTER — Emergency Department
Admission: EM | Admit: 2016-08-21 | Discharge: 2016-08-21 | Disposition: A | Discharge status: Home / Self Care | Payer: BLUE CROSS/BLUE SHIELD | Source: Home / Self Care | Attending: Family Medicine | Admitting: Family Medicine

## 2016-08-21 DIAGNOSIS — R197 Diarrhea, unspecified: Secondary | ICD-10-CM

## 2016-08-21 DIAGNOSIS — M940 Chondrocostal junction syndrome [Tietze]: Secondary | ICD-10-CM

## 2016-08-21 DIAGNOSIS — R0602 Shortness of breath: Secondary | ICD-10-CM

## 2016-08-21 DIAGNOSIS — R5383 Other fatigue: Secondary | ICD-10-CM

## 2016-08-21 DIAGNOSIS — R05 Cough: Secondary | ICD-10-CM

## 2016-08-21 NOTE — ED Triage Notes (Signed)
Pt c/o allergy s/s x 2 wks. He reports vomiting x 3 and diarrhea Monday night that has resolved. He now c/o fatigue, SOB, and non productive cough.

## 2016-08-21 NOTE — ED Provider Notes (Signed)
[facility]    CSN: [id number] Arrival date & time: 08/21/16  1212     History   Chief Complaint Chief Complaint  Patient presents with  . Cough    HPI Robert Schultz is a 50 y.o. male.   Patient complains of onset of sinus congestion and minimal cough about 2 weeks ago that have improved.  Two days ago he developed nausea/vomiting and diarrhea, now improving.  He now has developed fatigue, tightness in his anterior chest and sensation of shortness of breath, and increased non-productive cough. He is concerned about possible CAD; his father has had two stents placed.  Patient had a negative cardiac cath about 13 years ago.  He denies chest pain with activity. He has a past history of pneumonia.   The history is provided by the patient.    Past Medical History:  Diagnosis Date  . AV block   . Cervical radicular pain   . Diverticulitis of colon   . Gastritis   . HTN (hypertension)   . Hyperlipidemia   . Morbid obesity ([place])   . Normal coronary angiogram 2004  . Palpitations   . Radiculitis of leg   . Sleep apnea   . Thoracic spine pain     Patient Active Problem List   Diagnosis Date Noted  . CAP (community acquired pneumonia) 07/02/2014  . Sepsis ([place]) 07/02/2014  . DM (diabetes mellitus) ([place]) 08/26/2013  . Numbness 08/26/2013  . Cardiovascular risk factor 04/04/2011  . HTN (hypertension) 03/08/2011  . Heart palpitations 03/08/2011  . Hyperlipidemia 03/08/2011  . OBSTRUCTIVE SLEEP APNEA 05/19/2009  . 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 NASAL POLYP 05/19/2009  . MIGRAINES, HX OF 05/19/2009    Past Surgical History:  Procedure Laterality Date  . CARDIAC CATHETERIZATION  11/17/2002   NORMAL. EF 65%       Home Medications    Prior to Admission medications   Medication Sig Start Date End Date Taking? Authorizing Provider  metformin (FORTAMET) 1000 MG (OSM) 24 hr tablet Take 1,000 mg by mouth daily with breakfast.    Historical Provider, MD     Family History Family History  Problem Relation Age of Onset  . Multiple sclerosis Mother   . Hypertension Father   . Cancer Father     Social History Social History  Substance Use Topics  . Smoking status: Never Smoker  . Smokeless tobacco: Never Used  . Alcohol use No     Allergies   Hydrocodone and Penicillins   Review of Systems Review of Systems  No sore throat + cough No pleuritic pain, but has tightness in anterior chest. No wheezing + nasal congestion, resolved + post-nasal drainage No sinus pain/pressure No itchy/red eyes No earache No hemoptysis ? SOB No fever/chills No nausea No vomiting No abdominal pain No diarrhea No urinary symptoms No skin rash + fatigue No myalgias No headache Used OTC meds without relief    Physical Exam Triage Vital Signs ED Triage Vitals  Enc Vitals Group     BP 08/21/16 1249 129/89     Pulse Rate 08/21/16 1249 77     Resp 08/21/16 1249 16     Temp 08/21/16 1249 98.7 F (37.1 C)     Temp Source 08/21/16 1249 Oral     SpO2 08/21/16 1249 98 %     Weight 08/21/16 1249 242 lb (109.8 kg)     Height 08/21/16 1249 5\' 8"  (1.727 m)     Head Circumference --  Peak Flow --      Pain Score 08/21/16 1250 0     Pain Loc --      Pain Edu? --      Excl. in [place]? --    No data found.   Updated Vital Signs BP 129/89 (BP Location: Left Arm)   Pulse 77   Temp 98.7 F (37.1 C) (Oral)   Resp 16   Ht 5\' 8"  (1.727 m)   Wt 242 lb (109.8 kg)   SpO2 98%   BMI 36.80 kg/m   Visual Acuity Right Eye Distance:   Left Eye Distance:   Bilateral Distance:    Right Eye Near:   Left Eye Near:    Bilateral Near:     Physical Exam Nursing notes and Vital Signs reviewed. Appearance:  Patient appears stated age, and in no acute distress Eyes:  Pupils are equal, round, and reactive to light and accomodation.  Extraocular movement is intact.  Conjunctivae are not inflamed  Ears:  Canals normal.  Tympanic membranes  normal.  Nose:  Mildly congested turbinates.  No sinus tenderness.   Pharynx:  Normal; moist mucous membranes  Neck:  Supple.  No adenopathy.  Lungs:  Clear to auscultation.  Breath sounds are equal.  Moving air well. Chest:  Distinct tenderness to palpation over the mid-sternum and left pectoralis muscle. Heart:  Regular rate and rhythm without murmurs, rubs, or gallops.  Abdomen:  Nontender without masses or hepatosplenomegaly.  Bowel sounds are present.  No CVA or flank tenderness.  Extremities:  No edema.  No calf tenderness.  Skin:  No rash present.    UC Treatments / Results  Labs (all labs ordered are listed, but only abnormal results are displayed) Labs Reviewed  TSH    EKG  EKG Interpretation  Rate:  76 BPM PR:  152 msec QT:  396 msec QTcH:  445 msec QRSD:  94 msec QRS axis:  -22 degrees Interpretation: Normal sinus rhythm; within normal limits     Review of EKG done 10 October, 2015, shows no significant change.  Radiology Dg Chest 2 View  Result Date: 08/21/2016 CLINICAL DATA:  50 year old male with cough and shortness of breath for 1 week. Initial encounter. EXAM: CHEST  2 VIEW COMPARISON:  08/24/2014 and earlier. FINDINGS: Stable mild elevation of the right hemidiaphragm. Stable and relatively normal lung volumes. Normal cardiac size and mediastinal contours. Visualized tracheal air column is within normal limits. No pneumothorax, pulmonary edema, pleural effusion or confluent pulmonary opacity. No acute osseous abnormality identified. IMPRESSION: No acute cardiopulmonary abnormality. Electronically Signed   By: Genevie Ann M.D.   On: 08/21/2016 13:59    Procedures Procedures (including critical care time)  Medications Ordered in UC Medications - No data to display   Initial Impression / Assessment and Plan / UC Course  I have reviewed the triage vital signs and the nursing notes.  Pertinent labs & imaging results that were available during my care of the  patient were reviewed by me and considered in my medical decision making (see chart for details).  Clinical Course   Normal EKG and chest X-ray reassuring. Suspect costochondritis a result of recent URI.  Now recovering from viral GI syndrome. Begin clear liquids for about 12 hours, then may begin a [employer] (Bananas, Rice, Applesauce, Toast). Then gradually advance to a regular diet as tolerated.  Avoid milk products until well. May take Ibuprofen 200mg , 4 tabs every 8 hours with food until chest discomfort  improves. If symptoms become significantly worse during the night or over the weekend, proceed to the local emergency room.     Final Clinical Impressions(s) / UC Diagnoses   Final diagnoses:  Other fatigue  Costochondritis, acute  Diarrhea, unspecified type    New Prescriptions New Prescriptions   No medications on file     Kandra Nicolas, MD 08/29/16 [phone]

## 2016-08-21 NOTE — Discharge Instructions (Signed)
Begin clear liquids for about 12 hours, then may begin a [employer] (Bananas, Rice, Applesauce, Toast). Then gradually advance to a regular diet as tolerated.  Avoid milk products until well. May take Ibuprofen 200mg , 4 tabs every 8 hours with food until chest discomfort improves. If symptoms become significantly worse during the night or over the weekend, proceed to the local emergency room.

## 2016-08-22 ENCOUNTER — Telehealth: Payer: Self-pay | Admitting: Emergency Medicine

## 2016-08-22 LAB — TSH: TSH: 1.5 mIU/L (ref 0.40–4.50)

## 2016-08-22 NOTE — Telephone Encounter (Signed)
Spoke with patient and gave him TSH nml results. He is not feeling much better, but this is only 24 hours since visit. He knows to follow with PCP if not improving or actually feeling worse.

## 2016-08-30 NOTE — Telephone Encounter (Signed)
Encounter created to add EKG order

## 2016-12-16 ENCOUNTER — Encounter (HOSPITAL_COMMUNITY): Payer: Self-pay

## 2016-12-16 ENCOUNTER — Emergency Department (HOSPITAL_COMMUNITY)
Admission: EM | Admit: 2016-12-16 | Discharge: 2016-12-16 | Disposition: A | Discharge status: Home / Self Care | Payer: BLUE CROSS/BLUE SHIELD | Attending: Emergency Medicine | Admitting: Emergency Medicine

## 2016-12-16 ENCOUNTER — Emergency Department (HOSPITAL_COMMUNITY): Payer: BLUE CROSS/BLUE SHIELD

## 2016-12-16 ENCOUNTER — Other Ambulatory Visit: Payer: Self-pay

## 2016-12-16 DIAGNOSIS — E119 Type 2 diabetes mellitus without complications: Secondary | ICD-10-CM | POA: Diagnosis not present

## 2016-12-16 DIAGNOSIS — I10 Essential (primary) hypertension: Secondary | ICD-10-CM | POA: Insufficient documentation

## 2016-12-16 DIAGNOSIS — K8020 Calculus of gallbladder without cholecystitis without obstruction: Secondary | ICD-10-CM | POA: Diagnosis not present

## 2016-12-16 DIAGNOSIS — R109 Unspecified abdominal pain: Secondary | ICD-10-CM

## 2016-12-16 DIAGNOSIS — R1013 Epigastric pain: Secondary | ICD-10-CM | POA: Diagnosis present

## 2016-12-16 DIAGNOSIS — Z7984 Long term (current) use of oral hypoglycemic drugs: Secondary | ICD-10-CM | POA: Insufficient documentation

## 2016-12-16 LAB — BASIC METABOLIC PANEL
Anion gap: 10 (ref 5–15)
BUN: 10 mg/dL (ref 6–20)
CO2: 29 mmol/L (ref 22–32)
Calcium: 9.4 mg/dL (ref 8.9–10.3)
Chloride: 100 mmol/L — ABNORMAL LOW (ref 101–111)
Creatinine, Ser: 0.87 mg/dL (ref 0.61–1.24)
GFR calc Af Amer: >60 mL/min (ref >60)
GFR calc non Af Amer: >60 mL/min (ref >60)
Glucose, Bld: 125 mg/dL — ABNORMAL HIGH (ref 65–99)
Potassium: 4.2 mmol/L (ref 3.5–5.1)
Sodium: 139 mmol/L (ref 135–145)

## 2016-12-16 LAB — I-STAT TROPONIN, ED
Troponin i, poc: 0 ng/mL (ref 0.00–0.08)
Troponin i, poc: 0 ng/mL (ref 0.00–0.08)

## 2016-12-16 LAB — CBC
HCT: 44.6 % (ref 39.0–52.0)
Hemoglobin: 14.9 g/dL (ref 13.0–17.0)
MCH: 33.3 pg (ref 26.0–34.0)
MCHC: 33.4 g/dL (ref 30.0–36.0)
MCV: 99.8 fL (ref 78.0–100.0)
Platelets: 192 10*3/uL (ref 150–400)
RBC: 4.47 MIL/uL (ref 4.22–5.81)
RDW: 13.3 % (ref 11.5–15.5)
WBC: 8.1 10*3/uL (ref 4.0–10.5)

## 2016-12-16 LAB — LIPASE, BLOOD: Lipase: 27 U/L (ref 11–51)

## 2016-12-16 MED ORDER — ACETAMINOPHEN 325 MG PO TABS: 650.0000 mg | ORAL_TABLET | Freq: Four times a day (QID) | ORAL | 0 refills | Status: DC | PRN

## 2016-12-16 MED ORDER — GI COCKTAIL ~~LOC~~
30.0000 mL | Freq: Once | ORAL | Status: AC
  Administered 2016-12-16: 30 mL via ORAL
  Filled 2016-12-16: qty 30

## 2016-12-16 MED ORDER — OMEPRAZOLE 20 MG PO CPDR: 20.0000 mg | DELAYED_RELEASE_CAPSULE | Freq: Every day | ORAL | 0 refills | Status: DC

## 2016-12-16 NOTE — ED Notes (Signed)
Hooked patient up to the monitor and into a gown patient is resting

## 2016-12-16 NOTE — ED Provider Notes (Addendum)
[place] DEPT Provider Note   CSN: [id number] Arrival date & time: 12/16/16  6734     History   Chief Complaint Chief Complaint  Patient presents with  . Chest Pain    HPI Robert Schultz is a 51 y.o. male.  HPI  51 y.o. male with a history of diabetes mellitus, type II, hypertension, hyperlipidemia, that presented to the emergency department for epigastric abd pain. Pt reports that he has been having abd pain, off and on for 2 weeks. He was seen by his pcp,lipase was elevated at 81, and the pcp is to f.u the care on that side. Over the past weekend, the pain is more constant and severe. PT describes the pain as burning type pain. Pt is not sure if the pain is worse with po intake. Pt has no premature CAD in the family. Pt is not a smoker.   Past Medical History:  Diagnosis Date  . AV block   . Cervical radicular pain   . Diverticulitis of colon   . Gastritis   . HTN (hypertension)   . Hyperlipidemia   . Morbid obesity ([place])   . Normal coronary angiogram 2004  . Palpitations   . Radiculitis of leg   . Sleep apnea   . Thoracic spine pain     Patient Active Problem List   Diagnosis Date Noted  . CAP (community acquired pneumonia) 07/02/2014  . Sepsis ([place]) 07/02/2014  . DM (diabetes mellitus) ([place]) 08/26/2013  . Numbness 08/26/2013  . Cardiovascular risk factor 04/04/2011  . HTN (hypertension) 03/08/2011  . Heart palpitations 03/08/2011  . Hyperlipidemia 03/08/2011  . OBSTRUCTIVE SLEEP APNEA 05/19/2009  . 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 NASAL POLYP 05/19/2009  . MIGRAINES, HX OF 05/19/2009    Past Surgical History:  Procedure Laterality Date  . CARDIAC CATHETERIZATION  11/17/2002   NORMAL. EF 65%       Home Medications    Prior to Admission medications   Medication Sig Start Date End Date Taking? Authorizing Provider  ibuprofen (ADVIL,MOTRIN) 200 MG tablet Take 200 mg by mouth every 6 (six) hours as needed for moderate pain.   Yes Historical Provider,  MD  metFORMIN (GLUCOPHAGE-XR) 500 MG 24 hr tablet Take 1,000 mg by mouth every evening. 12/12/16  Yes Historical Provider, MD  Tetrahydrozoline HCl (VISINE OP) Place 1 drop into both eyes daily as needed (dry eyes).   Yes Historical Provider, MD  Vitamin D, Ergocalciferol, (DRISDOL) 50000 units CAPS capsule Take 50,000 Units by mouth once a week. 12/13/16  Yes Historical Provider, MD  acetaminophen (TYLENOL) 325 MG tablet Take 2 tablets (650 mg total) by mouth every 6 (six) hours as needed. 12/16/16   Varney Biles, MD  omeprazole (PRILOSEC) 20 MG capsule Take 1 capsule (20 mg total) by mouth daily. 12/16/16   Varney Biles, MD    Family History Family History  Problem Relation Age of Onset  . Multiple sclerosis Mother   . Hypertension Father   . Cancer Father     Social History Social History  Substance Use Topics  . Smoking status: Never Smoker  . Smokeless tobacco: Never Used  . Alcohol use No     Allergies   Hydrocodone and Penicillins   Review of Systems Review of Systems  ROS 10 Systems reviewed and are negative for acute change except as noted in the HPI.     Physical Exam Updated Vital Signs BP 138/77   Pulse (!) 57   Temp 98.1 F (  36.7 C) (Oral)   Resp 14   Ht 5\' 8"  (1.727 m)   Wt 242 lb (109.8 kg)   SpO2 96%   BMI 36.80 kg/m   Physical Exam  Constitutional: He is oriented to person, place, and time. He appears well-developed.  HENT:  Head: Normocephalic and atraumatic.  Eyes: Conjunctivae and EOM are normal. Pupils are equal, round, and reactive to light.  Neck: Normal range of motion. Neck supple.  Cardiovascular: Normal rate and regular rhythm.   Pulmonary/Chest: Effort normal and breath sounds normal.  Abdominal: Soft. Bowel sounds are normal. He exhibits no distension and no mass. There is tenderness. There is no rebound and no guarding.  RUQ tenderness, epigastric tenderness. Neg murphy's  Musculoskeletal: He exhibits no deformity.    Neurological: He is alert and oriented to person, place, and time.  Skin: Skin is warm.  Nursing note and vitals reviewed.    ED Treatments / Results  Labs (all labs ordered are listed, but only abnormal results are displayed) Labs Reviewed  BASIC METABOLIC PANEL - Abnormal; Notable for the following:       Result Value   Chloride 100 (*)    Glucose, Bld 125 (*)    All other components within normal limits  CBC  LIPASE, BLOOD  I-STAT TROPOININ, ED  I-STAT TROPOININ, ED    EKG  EKG Interpretation  Date/Time:  Monday December 16 2016 08:57:24 EDT Ventricular Rate:  79 PR Interval:  152 QRS Duration: 86 QT Interval:  378 QTC Calculation: 433 R Axis:   -26 Text Interpretation:  Normal sinus rhythm Normal ECG No acute changes No significant change since last tracing Confirmed by Kathrynn Humble, MD, Thelma Comp [id number]) on 12/16/2016 9:29:21 AM       Radiology Dg Chest 2 View  Result Date: 12/16/2016 CLINICAL DATA:  Chest pain for 2 weeks EXAM: CHEST  2 VIEW COMPARISON:  August 21, 2016 FINDINGS: There is no edema or consolidation. Heart size and pulmonary vascularity are normal. No adenopathy. No pneumothorax. No bone lesions. IMPRESSION: No edema or consolidation. Electronically Signed   By: Lowella Grip III M.D.   On: 12/16/2016 09:24   US Abdomen Limited Ruq  Result Date: 12/16/2016 CLINICAL DATA:  Upper abdomen and chest pain. EXAM: US ABDOMEN LIMITED - RIGHT UPPER QUADRANT COMPARISON:  CT 08/30/2011 . FINDINGS: Gallbladder: Tiny gallstone measuring up the 6.9 mm noted. Common bile duct: Diameter: 3.8 mm Liver: Increased echodensity consistent fatty infiltration and/or hepatocellular disease. No focal hepatic abnormality identified. IMPRESSION: 1. Cholelithiasis. No evidence of cholecystitis. No biliary distention. 2. Liver is echodense consistent fatty infiltration and/or hepatocellular disease. Electronically Signed   By: Marcello Moores  Register   On: 12/16/2016 12:55     Procedures Procedures (including critical care time)  Medications Ordered in ED Medications  gi cocktail (Maalox,Lidocaine,Donnatal) (30 mLs Oral Given 12/16/16 1108)     Initial Impression / Assessment and Plan / ED Course  I have reviewed the triage vital signs and the nursing notes.  Pertinent labs & imaging results that were available during my care of the patient were reviewed by me and considered in my medical decision making (see chart for details).  Clinical Course as of Dec 17 1442  Mon Dec 16, 2016  1443 Results from the ER workup discussed with the patient face to face and all questions answered to the best of my ability.  Strict ER return precautions have been discussed, and patient is agreeing with the plan and is comfortable with  the workup done and the recommendations from the ER.  US Abdomen Limited RUQ [AN]    Clinical Course User Index [AN] Varney Biles, MD    DDx includes: Pancreatitis Hepatobiliary pathology including cholecystitis Gastritis/PUD SBO ACS syndrome Aortic Dissection  Pt comes in with cc of abd pain. Pt is having epigastric abd/chest pain. HEAR score is 3 (risk factors and age). Trops x 2 ordered.  Pain is reproducible with palpation and we will get [place] abd. If the workup in the ER is neg, we will tx as PUD and advise GI f/u.  Final Clinical Impressions(s) / ED Diagnoses   Final diagnoses:  Abdominal pain  Calculus of gallbladder without cholecystitis without obstruction    New Prescriptions New Prescriptions   ACETAMINOPHEN (TYLENOL) 325 MG TABLET    Take 2 tablets (650 mg total) by mouth every 6 (six) hours as needed.   OMEPRAZOLE (PRILOSEC) 20 MG CAPSULE    Take 1 capsule (20 mg total) by mouth daily.     Varney Biles, MD 12/16/16 [place], MD 12/16/16 1444

## 2016-12-16 NOTE — ED Notes (Signed)
Hooked patient back up to monitor pt is resting

## 2016-12-16 NOTE — ED Triage Notes (Signed)
Patient complains of epigastric /chest pain intermittently x 2 weeks. States that he was told by primary MD may be pancreatitis and will follow up with him this week. Alert and oriented, NAD

## 2016-12-16 NOTE — Discharge Instructions (Signed)
We saw you in the ER for the epigastric chest pain. All the results in the ER are normal on the cardiac / heart side.  You do have gallstones per ultrasound, and we  recommend that you see Surgeons for further care.  Please return to the ER if your symptoms worsen; you have increased pain, fevers, chills, inability to keep any medications down, confusion. Otherwise see the outpatient doctor as requested.

## 2017-01-06 ENCOUNTER — Other Ambulatory Visit: Payer: Self-pay | Admitting: Physician Assistant

## 2017-01-06 DIAGNOSIS — R748 Abnormal levels of other serum enzymes: Secondary | ICD-10-CM

## 2017-01-09 ENCOUNTER — Ambulatory Visit
Admission: RE | Admit: 2017-01-09 | Discharge: 2017-01-09 | Disposition: A | Discharge status: Home / Self Care | Payer: BLUE CROSS/BLUE SHIELD | Source: Ambulatory Visit | Attending: Physician Assistant | Admitting: Physician Assistant

## 2017-01-09 DIAGNOSIS — R748 Abnormal levels of other serum enzymes: Secondary | ICD-10-CM

## 2017-01-09 MED ORDER — IOPAMIDOL (ISOVUE-300) INJECTION 61%
100.0000 mL | Freq: Once | INTRAVENOUS | Status: AC | PRN
  Administered 2017-01-09: 100 mL via INTRAVENOUS

## 2017-01-27 ENCOUNTER — Other Ambulatory Visit: Payer: Self-pay | Admitting: Physician Assistant

## 2017-01-27 DIAGNOSIS — R1013 Epigastric pain: Secondary | ICD-10-CM

## 2017-01-27 DIAGNOSIS — R748 Abnormal levels of other serum enzymes: Secondary | ICD-10-CM

## 2017-02-07 ENCOUNTER — Encounter: Payer: Self-pay | Admitting: *Deleted

## 2017-02-07 ENCOUNTER — Emergency Department (INDEPENDENT_AMBULATORY_CARE_PROVIDER_SITE_OTHER)
Admission: EM | Admit: 2017-02-07 | Discharge: 2017-02-07 | Disposition: A | Discharge status: Home / Self Care | Payer: BLUE CROSS/BLUE SHIELD | Source: Home / Self Care | Attending: Family Medicine | Admitting: Family Medicine

## 2017-02-07 DIAGNOSIS — B9789 Other viral agents as the cause of diseases classified elsewhere: Secondary | ICD-10-CM | POA: Diagnosis not present

## 2017-02-07 DIAGNOSIS — J029 Acute pharyngitis, unspecified: Secondary | ICD-10-CM

## 2017-02-07 DIAGNOSIS — H6983 Other specified disorders of Eustachian tube, bilateral: Secondary | ICD-10-CM | POA: Diagnosis not present

## 2017-02-07 DIAGNOSIS — J069 Acute upper respiratory infection, unspecified: Secondary | ICD-10-CM | POA: Diagnosis not present

## 2017-02-07 MED ORDER — BENZONATATE 100 MG PO CAPS: 100.0000 mg | ORAL_CAPSULE | Freq: Three times a day (TID) | ORAL | 0 refills | Status: DC

## 2017-02-07 MED ORDER — FLUTICASONE PROPIONATE 50 MCG/ACT NA SUSP: 2.0000 | Freq: Every day | NASAL | 2 refills | Status: DC

## 2017-02-07 NOTE — ED Triage Notes (Signed)
Patient c/o 3 days of fatigue, productive colored cough and nasal congestion and lymphadenopathy.tenderness @ neck. His throat is a little sore, he had an endoscopy 4 days ago. Taken Mucinex and tylenol otc.

## 2017-02-07 NOTE — ED Provider Notes (Signed)
CSN: [id number]     Arrival date & time 02/07/17  1044 History   First MD Initiated Contact with Patient 02/07/17 1059     Chief Complaint  Patient presents with  . Cough  . Nasal Congestion  . Lymphadenopathy   (Consider location/radiation/quality/duration/timing/severity/associated sxs/prior Treatment) HPI Robert Schultz is a 51 y.o. male presenting to UC with c/o 3 days of fatigue, productive cough with nasal congestion and lymphadenopathy, tenderness in his neck.  He reports having a mild sore throat but notes he also had a diagnostic endoscopy 4 days ago.  Throat pain started the next day.  Mild headache.  He has taken Mucinex and tylenol with minimal relief.  Denies fever, chills, n/v/d.    Past Medical History:  Diagnosis Date  . AV block   . Cervical radicular pain   . Diverticulitis of colon   . Gastritis   . HTN (hypertension)   . Hyperlipidemia   . Morbid obesity ([place])   . Normal coronary angiogram 2004  . Palpitations   . Radiculitis of leg   . Sleep apnea   . Thoracic spine pain    Past Surgical History:  Procedure Laterality Date  . CARDIAC CATHETERIZATION  11/17/2002   NORMAL. EF 65%   Family History  Problem Relation Age of Onset  . Multiple sclerosis Mother   . Hypertension Father   . Cancer Father    Social History  Substance Use Topics  . Smoking status: Never Smoker  . Smokeless tobacco: Never Used  . Alcohol use No    Review of Systems  Constitutional: Negative for chills and fever.  HENT: Positive for congestion, ear pain (fullness), postnasal drip and rhinorrhea. Negative for sore throat, trouble swallowing and voice change.   Respiratory: Positive for cough. Negative for shortness of breath.   Cardiovascular: Negative for chest pain and palpitations.  Gastrointestinal: Negative for abdominal pain, diarrhea, nausea and vomiting.  Musculoskeletal: Negative for arthralgias, back pain and myalgias.  Skin: Negative for rash.  Neurological:  Negative for dizziness, light-headedness and headaches.    Allergies  Hydrocodone and Penicillins  Home Medications   Prior to Admission medications   Medication Sig Start Date End Date Taking? Authorizing Provider  acetaminophen (TYLENOL) 325 MG tablet Take 2 tablets (650 mg total) by mouth every 6 (six) hours as needed. 12/16/16   Varney Biles, MD  benzonatate (TESSALON) 100 MG capsule Take 1-2 capsules (100-200 mg total) by mouth every 8 (eight) hours. 02/07/17   Noland Fordyce, PA-C  fluticasone (FLONASE) 50 MCG/ACT nasal spray Place 2 sprays into both nostrils daily. 02/07/17   Noland Fordyce, PA-C  ibuprofen (ADVIL,MOTRIN) 200 MG tablet Take 200 mg by mouth every 6 (six) hours as needed for moderate pain.    [provider]  metFORMIN (GLUCOPHAGE-XR) 500 MG 24 hr tablet Take 1,000 mg by mouth every evening. 12/12/16   [provider]  omeprazole (PRILOSEC) 20 MG capsule Take 1 capsule (20 mg total) by mouth daily. 12/16/16   Varney Biles, MD  Tetrahydrozoline HCl (VISINE OP) Place 1 drop into both eyes daily as needed (dry eyes).    [provider]  Vitamin D, Ergocalciferol, (DRISDOL) 50000 units CAPS capsule Take 50,000 Units by mouth once a week. 12/13/16   [provider]   Meds Ordered and Administered this Visit  Medications - No data to display  BP 129/85 (BP Location: Left Arm)   Pulse 97   Temp 98.6 F (37 C) (Oral)  Resp 14   Wt 235 lb (106.6 kg)   SpO2 97%   BMI 35.73 kg/m  No data found.   Physical Exam  Constitutional: He is oriented to person, place, and time. He appears well-developed and well-nourished. No distress.  HENT:  Head: Normocephalic and atraumatic.  Right Ear: Tympanic membrane normal.  Left Ear: Tympanic membrane normal.  Nose: Nose normal.  Mouth/Throat: Uvula is midline, oropharynx is clear and moist and mucous membranes are normal.  Eyes: EOM are normal.  Neck: Normal range of motion. Neck supple.   Cardiovascular: Normal rate and regular rhythm.   Pulmonary/Chest: Effort normal and breath sounds normal. No stridor. No respiratory distress. He has no wheezes. He has no rales.  Musculoskeletal: Normal range of motion.  Lymphadenopathy:    He has cervical adenopathy.  Neurological: He is alert and oriented to person, place, and time.  Skin: Skin is warm and dry. He is not diaphoretic.  Psychiatric: He has a normal mood and affect. His behavior is normal.  Nursing note and vitals reviewed.   Urgent Care Course     Procedures (including critical care time)  Labs Review Labs Reviewed - No data to display  Imaging Review No results found.  Tympanometry: Left & Right ear:  Negative tympanometric peak pressure,  C/w eustachian tube dysfunction.  MDM   1. Viral URI with cough   2. Eustachian tube dysfunction, bilateral   3. Sore throat    Hx and exam c/w viral illness Encouraged symptomatic treatment.  Rx: tessalon and flonase Encouraged sinus rinses.  F/u with PCP in 7-10 days if not improving.     Noland Fordyce, PA-C 02/07/17 1248

## 2017-02-10 ENCOUNTER — Other Ambulatory Visit: Payer: BLUE CROSS/BLUE SHIELD

## 2017-02-20 ENCOUNTER — Ambulatory Visit
Admission: RE | Admit: 2017-02-20 | Discharge: 2017-02-20 | Disposition: A | Discharge status: Home / Self Care | Payer: BLUE CROSS/BLUE SHIELD | Source: Ambulatory Visit | Attending: Physician Assistant | Admitting: Physician Assistant

## 2017-02-20 DIAGNOSIS — K8020 Calculus of gallbladder without cholecystitis without obstruction: Secondary | ICD-10-CM | POA: Diagnosis not present

## 2017-02-20 DIAGNOSIS — R1013 Epigastric pain: Secondary | ICD-10-CM

## 2017-02-20 DIAGNOSIS — R748 Abnormal levels of other serum enzymes: Secondary | ICD-10-CM

## 2017-02-20 MED ORDER — GADOBENATE DIMEGLUMINE 529 MG/ML IV SOLN: 20.0000 mL | Freq: Once | INTRAVENOUS | Status: DC | PRN

## 2017-03-13 DIAGNOSIS — R748 Abnormal levels of other serum enzymes: Secondary | ICD-10-CM | POA: Diagnosis not present

## 2017-03-13 DIAGNOSIS — K8020 Calculus of gallbladder without cholecystitis without obstruction: Secondary | ICD-10-CM | POA: Diagnosis not present

## 2017-03-13 DIAGNOSIS — R109 Unspecified abdominal pain: Secondary | ICD-10-CM | POA: Diagnosis not present

## 2017-03-13 DIAGNOSIS — K760 Fatty (change of) liver, not elsewhere classified: Secondary | ICD-10-CM | POA: Diagnosis not present

## 2017-07-11 DIAGNOSIS — R1013 Epigastric pain: Secondary | ICD-10-CM | POA: Diagnosis not present

## 2018-01-01 ENCOUNTER — Other Ambulatory Visit: Payer: Self-pay | Admitting: Family Medicine

## 2018-01-01 DIAGNOSIS — R911 Solitary pulmonary nodule: Secondary | ICD-10-CM

## 2018-01-07 ENCOUNTER — Ambulatory Visit
Admission: RE | Admit: 2018-01-07 | Discharge: 2018-01-07 | Disposition: A | Discharge status: Home / Self Care | Payer: BLUE CROSS/BLUE SHIELD | Source: Ambulatory Visit | Attending: Family Medicine | Admitting: Family Medicine

## 2018-01-07 DIAGNOSIS — R911 Solitary pulmonary nodule: Secondary | ICD-10-CM

## 2018-01-15 ENCOUNTER — Encounter: Payer: Self-pay | Admitting: *Deleted

## 2018-01-15 ENCOUNTER — Other Ambulatory Visit: Payer: Self-pay

## 2018-01-15 ENCOUNTER — Emergency Department (INDEPENDENT_AMBULATORY_CARE_PROVIDER_SITE_OTHER)
Admission: EM | Admit: 2018-01-15 | Discharge: 2018-01-15 | Disposition: A | Discharge status: Home / Self Care | Payer: BLUE CROSS/BLUE SHIELD | Source: Home / Self Care | Attending: Emergency Medicine | Admitting: Emergency Medicine

## 2018-01-15 DIAGNOSIS — M940 Chondrocostal junction syndrome [Tietze]: Secondary | ICD-10-CM

## 2018-01-15 DIAGNOSIS — K2100 Gastro-esophageal reflux disease with esophagitis, without bleeding: Secondary | ICD-10-CM

## 2018-01-15 DIAGNOSIS — R0789 Other chest pain: Secondary | ICD-10-CM | POA: Diagnosis not present

## 2018-01-15 MED ORDER — CYCLOBENZAPRINE HCL 5 MG PO TABS: 5.0000 mg | ORAL_TABLET | Freq: Three times a day (TID) | ORAL | 0 refills | Status: DC

## 2018-01-15 MED ORDER — GI COCKTAIL ~~LOC~~
30.0000 mL | Freq: Once | ORAL | Status: AC
  Administered 2018-01-15: 30 mL via ORAL

## 2018-01-15 NOTE — ED Notes (Signed)
Pt reports relief post GI cocktail. Charna Archer, LPN

## 2018-01-15 NOTE — ED Provider Notes (Signed)
[name]    CSN: [id number] Arrival date & time: 01/15/18  1223     History   Chief Complaint Chief Complaint  Patient presents with  . Chest Pain    HPI Robert Schultz is a 52 y.o. male.    Chest Pain  Pain location:  L chest and R chest Pain quality: sharp   Pain radiates to:  Does not radiate Pain severity:  Mild Duration:  2 days Timing:  Intermittent Progression:  Waxing and waning Chronicity:  New Context: movement, at rest and stress   Worsened by:  Certain positions and deep breathing (Pressing on the right and left anterior chest) Associated symptoms: anxiety, fatigue and heartburn (Epigastric burning and reflux symptoms)   Associated symptoms: no abdominal pain, no back pain, no claudication, no cough, no diaphoresis, no dizziness, no dysphagia, no fever, no lower extremity edema, no nausea, no near-syncope, no numbness, no orthopnea, no palpitations, no PND, no shortness of breath, no syncope and no vomiting   Risk factors: hypertension   Risk factors: no prior DVT/PE and no smoking    He states that Dr. Paulita Fujita is his GI and in the past had negative upper endoscopy 2018, except diagnosis of GERD.  In the past was on omeprazole 40 mg daily, but has been off that for many months.  He states he just restarted the omeprazole 40 mg, took 1 dose yesterday and that helped a little bit.  He states that he had an incidental finding of small lung nodule on CT one year ago, just recently had a follow-up chest CT on 01/08/2018, managed by his PCP: He states he is scheduled for follow-up CT chest in 3 months and that this is managed by his PCP, Dr Orland Mustard.  Recent radiology report: CT CHEST WITHOUT CONTRAST. COMPARISON:  CT of the abdomen and pelvis from 01/09/2017    IMPRESSION: Stable right lower lobe nodule when compared with the prior exam. New areas of parenchymal opacity as described within the left lower lobe which have a more postinflammatory  appearance. Short-term follow-up in 3 months with noncontrast CT is recommended to assess for resolution. Electronically Signed   By: Robert Schultz M.D.   On: 01/08/2018 09:09  Denies exertional chest pain or shortness of breath. Had a normal cardiac catheterization 2004. He states he feels he is under stress, helping to care for elderly mother, and also business stressors. Past Medical History:  Diagnosis Date  . AV block   . Cervical radicular pain   . Diverticulitis of colon   . Gastritis   . HTN (hypertension)   . Hyperlipidemia   . Morbid obesity ([place])   . Normal coronary angiogram 2004  . Palpitations   . Radiculitis of leg   . Sleep apnea   . Thoracic spine pain   Past medical history includes type 2 diabetes, he states it is controlled, and gastritis, hypertension.  Patient Active Problem List   Diagnosis Date Noted  . CAP (community acquired pneumonia) 07/02/2014  . Sepsis ([place]) 07/02/2014  . DM (diabetes mellitus) ([place]) 08/26/2013  . Numbness 08/26/2013  . Cardiovascular risk factor 04/04/2011  . HTN (hypertension) 03/08/2011  . Heart palpitations 03/08/2011  . Hyperlipidemia 03/08/2011  . OBSTRUCTIVE SLEEP APNEA 05/19/2009  . 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 NASAL POLYP 05/19/2009  . MIGRAINES, HX OF 05/19/2009    Past Surgical History:  Procedure Laterality Date  . CARDIAC CATHETERIZATION  11/17/2002   NORMAL. EF 65%  Home Medications    Prior to Admission medications   Medication Sig Start Date End Date Taking? Authorizing Provider  acetaminophen (TYLENOL) 325 MG tablet Take 2 tablets (650 mg total) by mouth every 6 (six) hours as needed. 12/16/16   Varney Biles, Robert Schultz  cyclobenzaprine (FLEXERIL) 5 MG tablet Take 1 tablet (5 mg total) by mouth 3 (three) times daily. For muscle relaxant.  May take 2 at bedtime.  Caution, may cause drowsiness 01/15/18   Robert Cane, Robert Schultz  fluticasone [facility]) 50 MCG/ACT nasal spray Place 2 sprays into both nostrils  daily. 02/07/17   Noe Gens, PA-C  ibuprofen (ADVIL,MOTRIN) 200 MG tablet Take 200 mg by mouth every 6 (six) hours as needed for moderate pain.    Provider, Historical, Robert Schultz  metFORMIN (GLUCOPHAGE-XR) 500 MG 24 hr tablet Take 1,000 mg by mouth every evening. 12/12/16   Provider, Historical, Robert Schultz  omeprazole (PRILOSEC) 20 MG capsule Take 1 capsule (20 mg total) by mouth daily. 12/16/16   Varney Biles, Robert Schultz  Tetrahydrozoline HCl (VISINE OP) Place 1 drop into both eyes daily as needed (dry eyes).    Provider, Historical, Robert Schultz  Vitamin D, Ergocalciferol, (DRISDOL) 50000 units CAPS capsule Take 50,000 Units by mouth once a week. 12/13/16   Provider, Historical, Robert Schultz    Family History Family History  Problem Relation Age of Onset  . Multiple sclerosis Mother   . Hypertension Father   . Cancer Father    Positive for hypertension Social History Social History   Tobacco Use  . Smoking status: Never Smoker  . Smokeless tobacco: Never Used  Substance Use Topics  . Alcohol use: No  . Drug use: No  Does not smoke   Allergies   Hydrocodone and Penicillins   Review of Systems Review of Systems  Constitutional: Positive for fatigue. Negative for chills, diaphoresis and fever.  HENT: Negative for trouble swallowing.   Respiratory: Negative for cough and shortness of breath.   Cardiovascular: Positive for chest pain. Negative for palpitations, orthopnea, claudication, syncope, PND and near-syncope.  Gastrointestinal: Positive for heartburn (Epigastric burning and reflux symptoms). Negative for abdominal pain, nausea and vomiting.  Genitourinary: Negative.   Musculoskeletal: Negative for back pain.  Neurological: Negative for dizziness and numbness.  Psychiatric/Behavioral: Negative for confusion, dysphoric mood, hallucinations and suicidal ideas. The patient is nervous/anxious (mild).   All other systems reviewed and are negative.    Physical Exam Triage Vital Signs ED Triage Vitals  Enc  Vitals Group     BP 01/15/18 1242 (!) 151/98     Pulse Rate 01/15/18 1242 80     Resp 01/15/18 1242 18     Temp 01/15/18 1242 98 F (36.7 C)     Temp Source 01/15/18 1242 Oral     SpO2 01/15/18 1242 98 %     Weight --      Height --      Head Circumference --      Peak Flow --      Pain Score 01/15/18 1243 4     Pain Loc --      Pain Edu? --      Excl. in [place]? --    No data found.  Updated Vital Signs BP (!) 151/98 (BP Location: Right Arm)   Pulse 80   Temp 98 F (36.7 C) (Oral)   Resp 18   SpO2 98%    Physical Exam  Constitutional: He is oriented to person, place, and time. He appears well-developed and well-nourished.  Non-toxic appearance. He does not appear ill. No distress.  Alert, pleasant, cooperative.  Appears mildly anxious.  No acute cardiorespiratory distress.  HENT:  Head: Normocephalic.  Eyes: Pupils are equal, round, and reactive to light. EOM are normal.  Neck: Neck supple. No JVD present.  Cardiovascular: Normal rate, regular rhythm and normal heart sounds.  No murmur heard.   Pulmonary/Chest: Effort normal and breath sounds normal. No respiratory distress. He exhibits tenderness.  Abdominal: Soft. He exhibits no distension and no mass. There is no rebound and no guarding.  Minimally tender epigastric area.  Otherwise no abdominal tenderness  Musculoskeletal: He exhibits no edema.  Neurological: He is oriented to person, place, and time. No cranial nerve deficit or sensory deficit.  Skin: Skin is warm and dry. Capillary refill takes less than 2 seconds.  Psychiatric:  Mildly anxious.  Otherwise, mood and affect normal.  No suicidal or homicidal ideation  Nursing note and vitals reviewed. Oxygen saturation 98% on room air.   UC Treatments / Results Here in urgent care, he was given GI cocktail and rechecked 20 minutes later and he felt his epigastric pain and chest pain improved.  BP rechecked 144/92   Labs (all labs ordered are listed, but only  abnormal results are displayed) Labs Reviewed - No data to display  EKG Stat EKG showed normal sinus rhythm, ventricular rate 82.  PR interval 154 ms.  QRS duration 86.  QT interval 376 ms. No ectopy. No ST or T wave abnormalities seen.  None Radiology No results found. He declined chest x-ray today Procedures Procedures (including critical care time)  Medications Ordered in UC Medications  gi cocktail (Maalox,Lidocaine,Donnatal) (30 mLs Oral Given 01/15/18 1324)     Initial Impression / Assessment and Plan / UC Course  I have reviewed the triage vital signs and the nursing notes.  Pertinent labs & imaging results that were available during my care of the patient were reviewed by me and considered in my medical decision making (see chart for details).    After GI cocktail given, his pain significantly improved.  He still had tenderness over costochondral junction and anterior intercostal muscles right and left chest.  EKG normal.  No evidence of acute cardiorespiratory cause.  He declined chest x-ray at this time, just had CT of chest last week showing questionable left lower lung density, he is scheduled to have repeat CT in 3 months. He has no signs or symptoms to suggest pulmonary embolism. Is under a great deal of stress which may be a factor in his symptoms. Final Clinical Impressions(s) / UC Diagnoses   Final diagnoses:  Chest wall pain  Atypical chest pain  Gastroesophageal reflux disease with esophagitis  Costochondritis  No evidence for acute cardiorespiratory cause of his chest pain.  Over 45 minutes spent, greater than 50% of the time spent for counseling and coordination of care. ED Discharge Orders        Ordered    cyclobenzaprine (FLEXERIL) 5 MG tablet  3 times daily     01/15/18 1414     Discussed all the above with him at length. Treatment options discussed, as well as risks, benefits, alternatives. Patient voiced understanding and agreement with the  following plans:  Flexeril prescribed, 5 mg, up to 3 times daily as needed muscle relaxant.  Precautions discussed.  Also take Tylenol for pain as Tylenol has helped somewhat.  We are avoiding NSAIDs because of his GERD. He just restarted omeprazole 40 mg daily about  14 hours ago, and I advised to continue omeprazole 40 mg daily and advised f/u with his GI within the next 10 to 14 days  Follow-up with your primary care doctor 3 days. I urged him to continue ongoing follow-up with PCP to address  hypertension, type 2 diabetes, following up abnormal (but stable) chest CT, as well as preventive care. -Emergency room sooner if worse or new symptoms Precautions discussed. Red flags discussed. Questions invited and answered. Patient voiced understanding and agreement.  He was given an AVS, and attached instruction sheets on chest wall pain and GERD.   Controlled Substance Prescriptions [place] Controlled Substance Registry consulted? Not Applicable   Robert Cane, Robert Schultz 01/18/18 1231

## 2018-01-15 NOTE — ED Triage Notes (Signed)
Pt c/o sharp pain in the center of his chest x 2 days with fatigue today. He had a chest CT last week for f/u of RT lower lung nodules. He reports nodules on the LT lower lung; he has a f/u CT in 3 mths.

## 2018-01-16 ENCOUNTER — Telehealth: Payer: Self-pay

## 2018-01-16 NOTE — Telephone Encounter (Signed)
Left msg for pt asking how he was doing after visit to UC. Advised pt to call us should he have any further questions or concerns.

## 2018-03-02 DIAGNOSIS — M25512 Pain in left shoulder: Secondary | ICD-10-CM | POA: Diagnosis not present

## 2018-03-03 DIAGNOSIS — R110 Nausea: Secondary | ICD-10-CM | POA: Diagnosis not present

## 2018-03-03 DIAGNOSIS — R079 Chest pain, unspecified: Secondary | ICD-10-CM | POA: Diagnosis not present

## 2018-03-03 DIAGNOSIS — R072 Precordial pain: Secondary | ICD-10-CM | POA: Diagnosis not present

## 2018-03-03 DIAGNOSIS — I10 Essential (primary) hypertension: Secondary | ICD-10-CM | POA: Diagnosis not present

## 2018-03-03 DIAGNOSIS — R109 Unspecified abdominal pain: Secondary | ICD-10-CM | POA: Diagnosis not present

## 2018-03-03 DIAGNOSIS — R1906 Epigastric swelling, mass or lump: Secondary | ICD-10-CM | POA: Diagnosis not present

## 2018-03-03 DIAGNOSIS — K219 Gastro-esophageal reflux disease without esophagitis: Secondary | ICD-10-CM | POA: Diagnosis not present

## 2018-03-03 DIAGNOSIS — E7800 Pure hypercholesterolemia, unspecified: Secondary | ICD-10-CM | POA: Diagnosis not present

## 2018-03-04 DIAGNOSIS — R072 Precordial pain: Secondary | ICD-10-CM | POA: Diagnosis not present

## 2018-03-05 DIAGNOSIS — R079 Chest pain, unspecified: Secondary | ICD-10-CM | POA: Diagnosis not present

## 2018-03-11 DIAGNOSIS — E119 Type 2 diabetes mellitus without complications: Secondary | ICD-10-CM | POA: Diagnosis not present

## 2018-03-11 DIAGNOSIS — E785 Hyperlipidemia, unspecified: Secondary | ICD-10-CM | POA: Diagnosis not present

## 2018-03-11 DIAGNOSIS — R079 Chest pain, unspecified: Secondary | ICD-10-CM | POA: Diagnosis not present

## 2018-04-06 ENCOUNTER — Other Ambulatory Visit: Payer: Self-pay | Admitting: Family Medicine

## 2018-04-06 DIAGNOSIS — R911 Solitary pulmonary nodule: Secondary | ICD-10-CM

## 2018-04-15 ENCOUNTER — Other Ambulatory Visit: Payer: BLUE CROSS/BLUE SHIELD

## 2018-04-20 ENCOUNTER — Ambulatory Visit
Admission: RE | Admit: 2018-04-20 | Discharge: 2018-04-20 | Disposition: A | Discharge status: Home / Self Care | Payer: BLUE CROSS/BLUE SHIELD | Source: Ambulatory Visit | Attending: Family Medicine | Admitting: Family Medicine

## 2018-04-20 DIAGNOSIS — R918 Other nonspecific abnormal finding of lung field: Secondary | ICD-10-CM | POA: Diagnosis not present

## 2018-04-20 DIAGNOSIS — R911 Solitary pulmonary nodule: Secondary | ICD-10-CM

## 2018-07-29 DIAGNOSIS — Z01818 Encounter for other preprocedural examination: Secondary | ICD-10-CM | POA: Diagnosis not present

## 2018-08-26 DIAGNOSIS — E119 Type 2 diabetes mellitus without complications: Secondary | ICD-10-CM | POA: Diagnosis not present

## 2018-08-26 DIAGNOSIS — G4733 Obstructive sleep apnea (adult) (pediatric): Secondary | ICD-10-CM | POA: Diagnosis not present

## 2018-08-26 DIAGNOSIS — I10 Essential (primary) hypertension: Secondary | ICD-10-CM | POA: Diagnosis not present

## 2018-08-27 DIAGNOSIS — G4733 Obstructive sleep apnea (adult) (pediatric): Secondary | ICD-10-CM | POA: Diagnosis not present

## 2018-09-05 DIAGNOSIS — R079 Chest pain, unspecified: Secondary | ICD-10-CM | POA: Diagnosis not present

## 2018-09-05 DIAGNOSIS — J069 Acute upper respiratory infection, unspecified: Secondary | ICD-10-CM | POA: Diagnosis not present

## 2018-09-05 DIAGNOSIS — R05 Cough: Secondary | ICD-10-CM | POA: Diagnosis not present

## 2018-09-07 DIAGNOSIS — G4733 Obstructive sleep apnea (adult) (pediatric): Secondary | ICD-10-CM | POA: Diagnosis not present

## 2018-09-09 DIAGNOSIS — I10 Essential (primary) hypertension: Secondary | ICD-10-CM | POA: Diagnosis not present

## 2018-09-09 DIAGNOSIS — E119 Type 2 diabetes mellitus without complications: Secondary | ICD-10-CM | POA: Diagnosis not present

## 2018-09-11 ENCOUNTER — Other Ambulatory Visit: Payer: Self-pay

## 2018-09-11 ENCOUNTER — Encounter: Payer: Self-pay | Admitting: Emergency Medicine

## 2018-09-11 ENCOUNTER — Emergency Department (INDEPENDENT_AMBULATORY_CARE_PROVIDER_SITE_OTHER)
Admission: EM | Admit: 2018-09-11 | Discharge: 2018-09-11 | Disposition: A | Discharge status: Home / Self Care | Payer: BLUE CROSS/BLUE SHIELD | Source: Home / Self Care

## 2018-09-11 DIAGNOSIS — R55 Syncope and collapse: Secondary | ICD-10-CM

## 2018-09-11 DIAGNOSIS — R002 Palpitations: Secondary | ICD-10-CM | POA: Diagnosis not present

## 2018-09-11 LAB — POCT FASTING CBG KUC MANUAL ENTRY: POCT Glucose (KUC): 207 mg/dL — AB (ref 70–99)

## 2018-09-11 NOTE — ED Triage Notes (Signed)
Lightheaded came in said he felt like he was going to pass out. Has been throwing PVC's lately, nausea, chest discomfort, anxiety, stress.

## 2018-09-11 NOTE — Discharge Instructions (Signed)
Drink plenty of fluids.   Go to the Emergency department if symptoms worsen,  take a baby asa a day until rechecked by your MD

## 2018-09-11 NOTE — ED Provider Notes (Signed)
[facility]    CSN: [id number] Arrival date & time: 09/11/18  1425     History   Chief Complaint Chief Complaint  Patient presents with  . Near Syncope    HPI Robert Schultz is a 52 y.o. male.   The history is provided by the patient. No language interpreter was used.  Palpitations  Palpitations quality:  Irregular Onset quality:  Gradual Duration:  1 day Timing:  Constant Progression:  Worsening Chronicity:  New Context: anxiety and caffeine   Relieved by:  Nothing Worsened by:  Nothing Ineffective treatments:  None tried Associated symptoms: no chest pain, no cough and no shortness of breath   Risk factors: no diabetes mellitus   Pt reports he had a cardiology work up with a stress test one month ago.  Pt has recently been taken off BP medications because he has been doing well   Past Medical History:  Diagnosis Date  . AV block   . Cervical radicular pain   . Diverticulitis of colon   . Gastritis   . HTN (hypertension)   . Hyperlipidemia   . Morbid obesity ([place])   . Normal coronary angiogram 2004  . Palpitations   . Radiculitis of leg   . Sleep apnea   . Thoracic spine pain     Patient Active Problem List   Diagnosis Date Noted  . CAP (community acquired pneumonia) 07/02/2014  . Sepsis ([place]) 07/02/2014  . DM (diabetes mellitus) ([place]) 08/26/2013  . Numbness 08/26/2013  . Cardiovascular risk factor 04/04/2011  . HTN (hypertension) 03/08/2011  . Heart palpitations 03/08/2011  . Hyperlipidemia 03/08/2011  . OBSTRUCTIVE SLEEP APNEA 05/19/2009  . 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 NASAL POLYP 05/19/2009  . MIGRAINES, HX OF 05/19/2009    Past Surgical History:  Procedure Laterality Date  . CARDIAC CATHETERIZATION  11/17/2002   NORMAL. EF 65%       Home Medications    Prior to Admission medications   Medication Sig Start Date End Date Taking? Authorizing Provider  acetaminophen (TYLENOL) 325 MG tablet Take 2 tablets (650 mg total) by  mouth every 6 (six) hours as needed. 12/16/16   Varney Biles, MD  cyclobenzaprine (FLEXERIL) 5 MG tablet Take 1 tablet (5 mg total) by mouth 3 (three) times daily. For muscle relaxant.  May take 2 at bedtime.  Caution, may cause drowsiness 01/15/18   Jacqulyn Cane, MD  fluticasone [facility]) 50 MCG/ACT nasal spray Place 2 sprays into both nostrils daily. 02/07/17   Noe Gens, PA-C  ibuprofen (ADVIL,MOTRIN) 200 MG tablet Take 200 mg by mouth every 6 (six) hours as needed for moderate pain.    [provider]  metFORMIN (GLUCOPHAGE-XR) 500 MG 24 hr tablet Take 1,000 mg by mouth every evening. 12/12/16   [provider]  omeprazole (PRILOSEC) 20 MG capsule Take 1 capsule (20 mg total) by mouth daily. 12/16/16   Varney Biles, MD  Tetrahydrozoline HCl (VISINE OP) Place 1 drop into both eyes daily as needed (dry eyes).    [provider]  Vitamin D, Ergocalciferol, (DRISDOL) 50000 units CAPS capsule Take 50,000 Units by mouth once a week. 12/13/16   [provider]    Family History Family History  Problem Relation Age of Onset  . Multiple sclerosis Mother   . Hypertension Father   . Cancer Father     Social History Social History   Tobacco Use  . Smoking status: Never Smoker  . Smokeless tobacco: Never Used  Substance Use Topics  . Alcohol use: No  . Drug use: No     Allergies   Hydrocodone and Penicillins   Review of Systems Review of Systems  Respiratory: Negative for cough, choking, chest tightness and shortness of breath.   Cardiovascular: Positive for palpitations. Negative for chest pain.  All other systems reviewed and are negative.    Physical Exam Triage Vital Signs ED Triage Vitals  Enc Vitals Group     BP 09/11/18 1450 (!) 161/97     Pulse Rate 09/11/18 1450 98     Resp --      Temp 09/11/18 1450 98 F (36.7 C)     Temp Source 09/11/18 1450 Oral     SpO2 09/11/18 1450 100 %     Weight --      Height --      Head  Circumference --      Peak Flow --      Pain Score 09/11/18 1451 0     Pain Loc --      Pain Edu? --      Excl. in [place]? --    Orthostatic VS for the past 24 hrs:  BP- Lying Pulse- Lying BP- Sitting Pulse- Sitting BP- Standing at 0 minutes Pulse- Standing at 0 minutes  09/11/18 1508 (!) 141/93 78 (!) 137/91 80 138/87 84    Updated Vital Signs BP (!) 161/97 (BP Location: Right Arm)   Pulse 98   Temp 98 F (36.7 C) (Oral)   SpO2 100%   Visual Acuity Right Eye Distance:   Left Eye Distance:   Bilateral Distance:    Right Eye Near:   Left Eye Near:    Bilateral Near:     Physical Exam Vitals signs and nursing note reviewed.  Constitutional:      Appearance: He is well-developed.  HENT:     Head: Normocephalic.     Right Ear: Tympanic membrane normal.     Left Ear: Tympanic membrane normal.     Nose: Nose normal.     Mouth/Throat:     Mouth: Mucous membranes are moist.  Neck:     Musculoskeletal: Normal range of motion.  Cardiovascular:     Rate and Rhythm: Normal rate.  Pulmonary:     Effort: Pulmonary effort is normal.  Abdominal:     General: There is no distension.  Musculoskeletal: Normal range of motion.  Skin:    General: Skin is warm.  Neurological:     Mental Status: He is alert and oriented to person, place, and time.  Psychiatric:        Mood and Affect: Mood normal.      UC Treatments / Results  Labs (all labs ordered are listed, but only abnormal results are displayed) Labs Reviewed - No data to display  EKG None  Radiology No results found.  Procedures Procedures (including critical care time)  Medications Ordered in UC Medications - No data to display  Initial Impression / Assessment and Plan / UC Course  I have reviewed the triage vital signs and the nursing notes.  Pertinent labs & imaging results that were available during my care of the patient were reviewed by me and considered in my medical decision making (see chart for  details).     Orthostatic normal,  Cardiac monitor shows pvc.  Pt able to ientify PVC as what he feels.  Ekg normal  Final Clinical Impressions(s) / UC Diagnoses   Final diagnoses:  Palpitations  Syncope, unspecified syncope type     Discharge Instructions     Drink plenty of fluids.   Go to the Emergency department if symptoms worsen,  take a baby asa a day until rechecked by your MD    ED Prescriptions    None     Controlled Substance Prescriptions [place] Controlled Substance Registry consulted? Not Applicable   Fransico Meadow, [place] 09/12/18 [id number]

## 2018-09-15 ENCOUNTER — Other Ambulatory Visit: Payer: Self-pay

## 2018-09-15 ENCOUNTER — Encounter (HOSPITAL_COMMUNITY): Payer: Self-pay | Admitting: Emergency Medicine

## 2018-09-15 ENCOUNTER — Observation Stay (HOSPITAL_COMMUNITY)
Admission: EM | Admit: 2018-09-15 | Discharge: 2018-09-16 | Disposition: A | Discharge status: Home / Self Care | Payer: BLUE CROSS/BLUE SHIELD | Attending: Internal Medicine | Admitting: Internal Medicine

## 2018-09-15 ENCOUNTER — Emergency Department (HOSPITAL_COMMUNITY): Payer: BLUE CROSS/BLUE SHIELD

## 2018-09-15 ENCOUNTER — Emergency Department (INDEPENDENT_AMBULATORY_CARE_PROVIDER_SITE_OTHER)
Admission: EM | Admit: 2018-09-15 | Discharge: 2018-09-15 | Payer: BLUE CROSS/BLUE SHIELD | Source: Home / Self Care | Attending: Family Medicine | Admitting: Family Medicine

## 2018-09-15 ENCOUNTER — Other Ambulatory Visit (HOSPITAL_COMMUNITY): Payer: Self-pay

## 2018-09-15 DIAGNOSIS — K760 Fatty (change of) liver, not elsewhere classified: Secondary | ICD-10-CM | POA: Diagnosis not present

## 2018-09-15 DIAGNOSIS — R918 Other nonspecific abnormal finding of lung field: Secondary | ICD-10-CM | POA: Diagnosis not present

## 2018-09-15 DIAGNOSIS — I4430 Unspecified atrioventricular block: Secondary | ICD-10-CM | POA: Diagnosis not present

## 2018-09-15 DIAGNOSIS — Z6835 Body mass index (BMI) 35.0-35.9, adult: Secondary | ICD-10-CM | POA: Diagnosis not present

## 2018-09-15 DIAGNOSIS — E119 Type 2 diabetes mellitus without complications: Secondary | ICD-10-CM

## 2018-09-15 DIAGNOSIS — Z7951 Long term (current) use of inhaled steroids: Secondary | ICD-10-CM | POA: Diagnosis not present

## 2018-09-15 DIAGNOSIS — K4090 Unilateral inguinal hernia, without obstruction or gangrene, not specified as recurrent: Secondary | ICD-10-CM | POA: Diagnosis not present

## 2018-09-15 DIAGNOSIS — K859 Acute pancreatitis without necrosis or infection, unspecified: Secondary | ICD-10-CM | POA: Diagnosis not present

## 2018-09-15 DIAGNOSIS — R109 Unspecified abdominal pain: Secondary | ICD-10-CM | POA: Diagnosis not present

## 2018-09-15 DIAGNOSIS — G4733 Obstructive sleep apnea (adult) (pediatric): Secondary | ICD-10-CM | POA: Insufficient documentation

## 2018-09-15 DIAGNOSIS — E860 Dehydration: Secondary | ICD-10-CM | POA: Diagnosis not present

## 2018-09-15 DIAGNOSIS — K8020 Calculus of gallbladder without cholecystitis without obstruction: Secondary | ICD-10-CM | POA: Insufficient documentation

## 2018-09-15 DIAGNOSIS — R10813 Right lower quadrant abdominal tenderness: Secondary | ICD-10-CM

## 2018-09-15 DIAGNOSIS — K219 Gastro-esophageal reflux disease without esophagitis: Secondary | ICD-10-CM | POA: Insufficient documentation

## 2018-09-15 DIAGNOSIS — Z9189 Other specified personal risk factors, not elsewhere classified: Secondary | ICD-10-CM

## 2018-09-15 DIAGNOSIS — Z791 Long term (current) use of non-steroidal anti-inflammatories (NSAID): Secondary | ICD-10-CM | POA: Diagnosis not present

## 2018-09-15 DIAGNOSIS — E669 Obesity, unspecified: Secondary | ICD-10-CM

## 2018-09-15 DIAGNOSIS — R52 Pain, unspecified: Secondary | ICD-10-CM

## 2018-09-15 DIAGNOSIS — Z7984 Long term (current) use of oral hypoglycemic drugs: Secondary | ICD-10-CM | POA: Diagnosis not present

## 2018-09-15 DIAGNOSIS — E785 Hyperlipidemia, unspecified: Secondary | ICD-10-CM | POA: Insufficient documentation

## 2018-09-15 DIAGNOSIS — I10 Essential (primary) hypertension: Secondary | ICD-10-CM | POA: Diagnosis not present

## 2018-09-15 DIAGNOSIS — R1084 Generalized abdominal pain: Secondary | ICD-10-CM

## 2018-09-15 DIAGNOSIS — Z79899 Other long term (current) drug therapy: Secondary | ICD-10-CM | POA: Diagnosis not present

## 2018-09-15 DIAGNOSIS — Z6834 Body mass index (BMI) 34.0-34.9, adult: Secondary | ICD-10-CM

## 2018-09-15 DIAGNOSIS — R110 Nausea: Secondary | ICD-10-CM

## 2018-09-15 DIAGNOSIS — K2970 Gastritis, unspecified, without bleeding: Secondary | ICD-10-CM | POA: Diagnosis not present

## 2018-09-15 DIAGNOSIS — R1013 Epigastric pain: Secondary | ICD-10-CM | POA: Diagnosis not present

## 2018-09-15 LAB — COMPREHENSIVE METABOLIC PANEL
ALT: 36 U/L (ref 0–44)
AST: 26 U/L (ref 15–41)
Albumin: 4.5 g/dL (ref 3.5–5.0)
Alkaline Phosphatase: 46 U/L (ref 38–126)
Anion gap: 10 (ref 5–15)
BUN: 13 mg/dL (ref 6–20)
CO2: 27 mmol/L (ref 22–32)
Calcium: 9.1 mg/dL (ref 8.9–10.3)
Chloride: 99 mmol/L (ref 98–111)
Creatinine, Ser: 0.86 mg/dL (ref 0.61–1.24)
GFR calc Af Amer: >60 mL/min (ref >60)
GFR calc non Af Amer: >60 mL/min (ref >60)
Glucose, Bld: 174 mg/dL — ABNORMAL HIGH (ref 70–99)
Potassium: 4.4 mmol/L (ref 3.5–5.1)
Sodium: 136 mmol/L (ref 135–145)
Total Bilirubin: 1.1 mg/dL (ref 0.3–1.2)
Total Protein: 7.8 g/dL (ref 6.5–8.1)

## 2018-09-15 LAB — LIPASE, BLOOD: Lipase: 77 U/L — ABNORMAL HIGH (ref 11–51)

## 2018-09-15 LAB — LIPID PANEL
Cholesterol: 127 mg/dL (ref 0–200)
HDL: 37 mg/dL — ABNORMAL LOW (ref >40)
LDL Cholesterol: 66 mg/dL (ref 0–99)
Total CHOL/HDL Ratio: 3.4 RATIO
Triglycerides: 122 mg/dL (ref <150)
VLDL: 24 mg/dL (ref 0–40)

## 2018-09-15 LAB — CBC
HCT: 45.8 % (ref 39.0–52.0)
Hemoglobin: 15 g/dL (ref 13.0–17.0)
MCH: 32.7 pg (ref 26.0–34.0)
MCHC: 32.8 g/dL (ref 30.0–36.0)
MCV: 99.8 fL (ref 80.0–100.0)
Platelets: 237 10*3/uL (ref 150–400)
RBC: 4.59 MIL/uL (ref 4.22–5.81)
RDW: 12.2 % (ref 11.5–15.5)
WBC: 12.2 10*3/uL — ABNORMAL HIGH (ref 4.0–10.5)
nRBC: 0 % (ref 0.0–0.2)

## 2018-09-15 LAB — URINALYSIS, ROUTINE W REFLEX MICROSCOPIC
Bilirubin Urine: NEGATIVE
Glucose, UA: NEGATIVE mg/dL
Hgb urine dipstick: NEGATIVE
Ketones, ur: 5 mg/dL — AB
Leukocytes, UA: NEGATIVE
Nitrite: NEGATIVE
Protein, ur: NEGATIVE mg/dL
Specific Gravity, Urine: 1.021 (ref 1.005–1.030)
pH: 6 (ref 5.0–8.0)

## 2018-09-15 LAB — GLUCOSE, CAPILLARY: Glucose-Capillary: 150 mg/dL — ABNORMAL HIGH (ref 70–99)

## 2018-09-15 MED ORDER — IOPAMIDOL (ISOVUE-300) INJECTION 61%
INTRAVENOUS | Status: AC
  Filled 2018-09-15: qty 150

## 2018-09-15 MED ORDER — ONDANSETRON HCL 4 MG/2ML IJ SOLN: 4.0000 mg | Freq: Four times a day (QID) | INTRAMUSCULAR | Status: DC | PRN

## 2018-09-15 MED ORDER — FENTANYL CITRATE (PF) 100 MCG/2ML IJ SOLN: 25.0000 ug | INTRAMUSCULAR | Status: DC | PRN

## 2018-09-15 MED ORDER — SODIUM CHLORIDE 0.9 % IV SOLN
INTRAVENOUS | Status: DC
  Administered 2018-09-15 – 2018-09-16 (×2): via INTRAVENOUS

## 2018-09-15 MED ORDER — LACTATED RINGERS IV BOLUS
1000.0000 mL | Freq: Once | INTRAVENOUS | Status: AC
  Administered 2018-09-15: 1000 mL via INTRAVENOUS

## 2018-09-15 MED ORDER — ACETAMINOPHEN 325 MG PO TABS: 650.0000 mg | ORAL_TABLET | Freq: Four times a day (QID) | ORAL | Status: DC | PRN

## 2018-09-15 MED ORDER — ALBUTEROL SULFATE (2.5 MG/3ML) 0.083% IN NEBU: 2.5000 mg | INHALATION_SOLUTION | RESPIRATORY_TRACT | Status: DC | PRN

## 2018-09-15 MED ORDER — INSULIN ASPART 100 UNIT/ML ~~LOC~~ SOLN
0.0000 [IU] | Freq: Three times a day (TID) | SUBCUTANEOUS | Status: DC
  Administered 2018-09-16: 2 [IU] via SUBCUTANEOUS
  Administered 2018-09-16: 1 [IU] via SUBCUTANEOUS

## 2018-09-15 MED ORDER — ONDANSETRON HCL 4 MG PO TABS: 4.0000 mg | ORAL_TABLET | Freq: Four times a day (QID) | ORAL | Status: DC | PRN

## 2018-09-15 MED ORDER — ENOXAPARIN SODIUM 40 MG/0.4ML ~~LOC~~ SOLN
40.0000 mg | SUBCUTANEOUS | Status: DC
  Filled 2018-09-15: qty 0.4

## 2018-09-15 MED ORDER — ONDANSETRON HCL 4 MG/2ML IJ SOLN
4.0000 mg | Freq: Once | INTRAMUSCULAR | Status: AC
  Administered 2018-09-15: 4 mg via INTRAVENOUS
  Filled 2018-09-15: qty 2

## 2018-09-15 MED ORDER — ATORVASTATIN CALCIUM 10 MG PO TABS
10.0000 mg | ORAL_TABLET | Freq: Every day | ORAL | Status: DC
  Administered 2018-09-16: 10 mg via ORAL
  Filled 2018-09-15: qty 1

## 2018-09-15 MED ORDER — IOPAMIDOL (ISOVUE-300) INJECTION 61%
100.0000 mL | Freq: Once | INTRAVENOUS | Status: AC | PRN
  Administered 2018-09-15: 100 mL via INTRAVENOUS

## 2018-09-15 MED ORDER — INSULIN ASPART 100 UNIT/ML ~~LOC~~ SOLN: 0.0000 [IU] | Freq: Every day | SUBCUTANEOUS | Status: DC

## 2018-09-15 MED ORDER — PANTOPRAZOLE SODIUM 40 MG PO TBEC
40.0000 mg | DELAYED_RELEASE_TABLET | Freq: Every day | ORAL | Status: DC
  Administered 2018-09-16: 40 mg via ORAL
  Filled 2018-09-15: qty 1

## 2018-09-15 MED ORDER — IOPAMIDOL (ISOVUE-300) INJECTION 61%
INTRAVENOUS | Status: AC
  Filled 2018-09-15: qty 100

## 2018-09-15 MED ORDER — ACETAMINOPHEN 650 MG RE SUPP: 650.0000 mg | Freq: Four times a day (QID) | RECTAL | Status: DC | PRN

## 2018-09-15 MED ORDER — FENTANYL CITRATE (PF) 100 MCG/2ML IJ SOLN
50.0000 ug | Freq: Once | INTRAMUSCULAR | Status: AC
  Administered 2018-09-15: 50 ug via INTRAVENOUS
  Filled 2018-09-15: qty 2

## 2018-09-15 MED ORDER — SODIUM CHLORIDE (PF) 0.9 % IJ SOLN
INTRAMUSCULAR | Status: AC
  Filled 2018-09-15: qty 50

## 2018-09-15 MED ORDER — POLYETHYLENE GLYCOL 3350 17 G PO PACK: 17.0000 g | PACK | Freq: Every day | ORAL | Status: DC | PRN

## 2018-09-15 NOTE — ED Notes (Signed)
Transport has been called.  

## 2018-09-15 NOTE — ED Notes (Signed)
ED TO INPATIENT HANDOFF REPORT  Name/Age/Gender Robert Schultz 52 y.o. male  Code Status Code Status History    Date Active Date Inactive Code Status Order ID Comments User Context   07/02/2014 1638 07/04/2014 1528 Full Code [id number]  Cristal Ford, DO Inpatient      Home/SNF/Other Home  Chief Complaint abd pain  Level of Care/Admitting Diagnosis ED Disposition    ED Disposition Condition Comment   [place] [facility]: [facility] [[id number]]  Level of Care: Telemetry [5]  Admit to tele based on following criteria: Monitor QTC interval  Diagnosis: Pancreatitis [914782]  Admitting Physician: Raiford Noble LATIF [[id number]]  Attending Physician: Raiford Noble LATIF [[id number]]  PT Class (Do Not Modify): Observation [104]  PT Acc Code (Do Not Modify): Observation [10022]       Medical History Past Medical History:  Diagnosis Date  . AV block   . Cervical radicular pain   . Diverticulitis of colon   . Gastritis   . HTN (hypertension)   . Hyperlipidemia   . Morbid obesity ([place])   . Normal coronary angiogram 2004  . Palpitations   . Radiculitis of leg   . Sleep apnea   . Thoracic spine pain     Allergies Allergies  Allergen Reactions  . Hydrocodone Other (See Comments)    "I break into a violent sweat--fast."  . Penicillins     Profuse sweating Swelling of the face/tongue/throat, SOB, or low BP? N Sudden or severe rash/hives, skin peeling, or the inside of the mouth or nose? n Did it require medical treatment? N When did it last happen?unk If all above answers are "NO", may proceed with cephalosporin use.   . Codeine   . Hydrocodone-Acetaminophen Swelling    IV Location/Drains/Wounds Patient Lines/Drains/Airways Status   Active Line/Drains/Airways    Name:   Placement date:   Placement time:   Site:   Days:   Peripheral IV 09/15/18 Left Antecubital   09/15/18    1250    Antecubital   less than 1           Labs/Imaging Results for orders placed or performed during the hospital encounter of 09/15/18 (from the past 48 hour(s))  Lipase, blood     Status: Abnormal   Collection Time: 09/15/18 12:02 PM  Result Value Ref Range   Lipase 77 (H) 11 - 51 U/L    Comment: Performed at [facility], [place] [address], [place], [place] [address]  Comprehensive metabolic panel     Status: Abnormal   Collection Time: 09/15/18 12:02 PM  Result Value Ref Range   Sodium 136 135 - 145 mmol/L   Potassium 4.4 3.5 - 5.1 mmol/L   Chloride 99 98 - 111 mmol/L   CO2 27 22 - 32 mmol/L   Glucose, Bld 174 (H) 70 - 99 mg/dL   BUN 13 6 - 20 mg/dL   Creatinine, Ser 0.86 0.61 - 1.24 mg/dL   Calcium 9.1 8.9 - 10.3 mg/dL   Total Protein 7.8 6.5 - 8.1 g/dL   Albumin 4.5 3.5 - 5.0 g/dL   AST 26 15 - 41 U/L   ALT 36 0 - 44 U/L   Alkaline Phosphatase 46 38 - 126 U/L   Total Bilirubin 1.1 0.3 - 1.2 mg/dL   GFR calc non Af Amer >60 >60 mL/min   GFR calc Af Amer >60 >60 mL/min   Anion gap 10 5 - 15    Comment: Performed  at [facility], [place] [address], [place], [place] [address]  CBC     Status: Abnormal   Collection Time: 09/15/18 12:02 PM  Result Value Ref Range   WBC 12.2 (H) 4.0 - 10.5 K/uL   RBC 4.59 4.22 - 5.81 MIL/uL   Hemoglobin 15.0 13.0 - 17.0 g/dL   HCT 45.8 39.0 - 52.0 %   MCV 99.8 80.0 - 100.0 fL   MCH 32.7 26.0 - 34.0 pg   MCHC 32.8 30.0 - 36.0 g/dL   RDW 12.2 11.5 - 15.5 %   Platelets 237 150 - 400 K/uL   nRBC 0.0 0.0 - 0.2 %    Comment: Performed at [facility], [place] [address], [place], [place] [address]  Urinalysis, Routine w reflex microscopic     Status: Abnormal   Collection Time: 09/15/18 12:34 PM  Result Value Ref Range   Color, Urine YELLOW YELLOW   APPearance CLEAR CLEAR   Specific Gravity, Urine 1.021 1.005 - 1.030   pH 6.0 5.0 - 8.0   Glucose, UA NEGATIVE NEGATIVE mg/dL   Hgb urine dipstick NEGATIVE NEGATIVE   Bilirubin Urine  NEGATIVE NEGATIVE   Ketones, ur 5 (A) NEGATIVE mg/dL   Protein, ur NEGATIVE NEGATIVE mg/dL   Nitrite NEGATIVE NEGATIVE   Leukocytes, UA NEGATIVE NEGATIVE    Comment: Performed at [facility] [address], [place], [place] [address]   Ct Abdomen Pelvis W Contrast  Result Date: 09/15/2018 CLINICAL DATA:  Right lower quadrant abdominal pain and distension for 1 day. EXAM: CT ABDOMEN AND PELVIS WITH CONTRAST TECHNIQUE: Multidetector CT imaging of the abdomen and pelvis was performed using the standard protocol following bolus administration of intravenous contrast. CONTRAST:  113mL ISOVUE-300 IOPAMIDOL (ISOVUE-300) INJECTION 61% COMPARISON:  01/09/2017. FINDINGS: Lower chest: The previously demonstrated 5 mm right lower lobe nodules unchanged on image number 29 series 6. Otherwise, clear lung bases. Normal sized heart. Hepatobiliary: Marked diffuse low density of the liver relative to the spleen with little change. 4 mm gallstone in the gallbladder. No gallbladder wall thickening or pericholecystic fluid. Pancreas: Interval mild peripancreatic edema adjacent to the tail of the pancreas. Spleen: Normal in size without focal abnormality. Adrenals/Urinary Tract: Normal appearing adrenal glands. Small amount of excreted contrast in both renal collecting systems. This makes it difficult to exclude a renal calculus on either side. No bladder or ureteral calculi and no hydronephrosis. Stomach/Bowel: Stomach is within normal limits. Appendix appears normal. No evidence of bowel wall thickening, distention, or inflammatory changes. Vascular/Lymphatic: No significant vascular findings are present. No enlarged abdominal or pelvic lymph nodes. Reproductive: Prostate is unremarkable. Other: Small left inguinal hernia containing fat. Musculoskeletal: Lumbar and lower thoracic spine degenerative changes. IMPRESSION: 1. Mild interval peripancreatic edema adjacent to the tail of the pancreas, compatible  with acute pancreatitis. 2. Normal appearing appendix without evidence of appendicitis. 3. Stable marked diffuse hepatic steatosis. 4. Cholelithiasis without evidence of cholecystitis. 5. Stable 5 mm right lower lobe nodule. No follow-up needed if patient is low-risk. Non-contrast chest CT can be considered in 12 months if patient is high-risk. This recommendation follows the consensus statement: Guidelines for Management of Incidental Pulmonary Nodules Detected on CT Images: From the Fleischner Society 2017; Radiology 2017; 284:228-243. Electronically Signed   By: Claudie Revering M.D.   On: 09/15/2018 14:05   US Abdomen Limited Ruq  Result Date: 09/15/2018 CLINICAL DATA:  Abdominal pain for 1 week.  Acute pancreatitis. EXAM: ULTRASOUND ABDOMEN LIMITED RIGHT  UPPER QUADRANT COMPARISON:  CT on 09/15/2018 FINDINGS: Gallbladder: At least 1 small calculus is seen in the gallbladder neck measuring approximately 5 mm. No evidence of gallbladder dilatation or wall thickening. No evidence of pericholecystic fluid. No sonographic Murphy sign noted by sonographer. Common bile duct: Diameter: 4 mm, within normal limits. Liver: Diffusely increased echogenicity of the hepatic parenchyma, consistent with hepatic steatosis. No focal mass lesion identified. Portal vein is patent on color Doppler imaging with normal direction of blood flow towards the liver. IMPRESSION: Cholelithiasis, without sonographic signs of cholecystitis or biliary ductal dilatation. Marked hepatic steatosis. Electronically Signed   By: Earle Gell M.D.   On: 09/15/2018 15:56   None  Pending Labs [employer] (From admission, onward)    Start     Ordered   Signed and Held  HIV antibody (Routine Testing)  Once,   R     Signed and Held   Signed and Held  Magnesium  Tomorrow morning,   R     Signed and Held   Signed and Held  Phosphorus  Tomorrow morning,   R     Signed and Held   Signed and Held  TSH  Once,   R     Signed and Held   Signed  and Held  Hemoglobin A1c  Tomorrow morning,   R     Signed and Held   Signed and Held  Comprehensive metabolic panel  Tomorrow morning,   R     Signed and Held   Signed and Held  CBC  Tomorrow morning,   R     Signed and Held   Signed and Held  Lipase, blood  Tomorrow morning,   R     Signed and Held   Signed and Held  Lipid panel  Add-on,   R     Signed and Held          Vitals/Pain Today's Vitals   09/15/18 1400 09/15/18 1430 09/15/18 1600 09/15/18 1622  BP: (!) 141/89 (!) 153/101 135/88   Pulse: 82 81 79   Resp: (!) 9 16 20    Temp:      TempSrc:      SpO2: 97% 98% 98%   PainSc:    2     Isolation Precautions No active isolations  Medications Medications  sodium chloride (PF) 0.9 % injection (has no administration in time range)  iopamidol (ISOVUE-300) 61 % injection (has no administration in time range)  lactated ringers bolus 1,000 mL (0 mLs Intravenous Stopped 09/15/18 1451)  ondansetron (ZOFRAN) injection 4 mg (4 mg Intravenous Given 09/15/18 1248)  fentaNYL (SUBLIMAZE) injection 50 mcg (50 mcg Intravenous Given 09/15/18 1249)  iopamidol (ISOVUE-300) 61 % injection 100 mL (100 mLs Intravenous Contrast Given 09/15/18 1332)  lactated ringers bolus 1,000 mL (0 mLs Intravenous Stopped 09/15/18 1637)  fentaNYL (SUBLIMAZE) injection 50 mcg (50 mcg Intravenous Given 09/15/18 1519)    Mobility walks

## 2018-09-15 NOTE — ED Notes (Signed)
US at bedside

## 2018-09-15 NOTE — ED Provider Notes (Signed)
[place] [place] DEPT Provider Note   CSN: [id number] Arrival date & time: 09/15/18  1142     History   Chief Complaint Chief Complaint  Patient presents with  . Abdominal Pain  . Nausea    HPI Robert Schultz is a 52 y.o. male.  The history is provided by the patient.  Abdominal Pain   This is a new problem. The current episode started 2 days ago. The problem occurs constantly. The problem has not changed since onset.The pain is associated with eating. The pain is located in the epigastric region. The quality of the pain is dull and aching. The pain is at a severity of 8/10. The pain is moderate. Associated symptoms include anorexia. Pertinent negatives include fever, diarrhea, melena, vomiting, constipation, dysuria, frequency, hematuria and arthralgias. The symptoms are aggravated by eating. Nothing relieves the symptoms. Past workup does not include GI consult. His past medical history is significant for gallstones.    Past Medical History:  Diagnosis Date  . AV block   . Cervical radicular pain   . Diverticulitis of colon   . Gastritis   . HTN (hypertension)   . Hyperlipidemia   . Morbid obesity ([place])   . Normal coronary angiogram 2004  . Palpitations   . Radiculitis of leg   . Sleep apnea   . Thoracic spine pain     Patient Active Problem List   Diagnosis Date Noted  . Pancreatitis 09/15/2018  . CAP (community acquired pneumonia) 07/02/2014  . Sepsis ([place]) 07/02/2014  . DM (diabetes mellitus) ([place]) 08/26/2013  . Numbness 08/26/2013  . Cardiovascular risk factor 04/04/2011  . HTN (hypertension) 03/08/2011  . Heart palpitations 03/08/2011  . Hyperlipidemia 03/08/2011  . OBSTRUCTIVE SLEEP APNEA 05/19/2009  . 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 NASAL POLYP 05/19/2009  . MIGRAINES, HX OF 05/19/2009    Past Surgical History:  Procedure Laterality Date  . CARDIAC CATHETERIZATION  11/17/2002   NORMAL. EF 65%        Home Medications     Prior to Admission medications   Medication Sig Start Date End Date Taking? Authorizing Provider  acetaminophen (TYLENOL) 325 MG tablet Take 2 tablets (650 mg total) by mouth every 6 (six) hours as needed. 12/16/16  Yes Varney Biles, MD  atorvastatin (LIPITOR) 10 MG tablet Take 10 mg by mouth daily.   Yes [provider]  ibuprofen (ADVIL,MOTRIN) 200 MG tablet Take 200 mg by mouth every 6 (six) hours as needed for moderate pain.   Yes [provider]  metFORMIN (GLUCOPHAGE-XR) 500 MG 24 hr tablet Take 500 mg by mouth every evening.  12/12/16  Yes [provider]  omeprazole (PRILOSEC) 20 MG capsule Take 1 capsule (20 mg total) by mouth daily. 12/16/16  Yes Varney Biles, MD  cyclobenzaprine (FLEXERIL) 5 MG tablet Take 1 tablet (5 mg total) by mouth 3 (three) times daily. For muscle relaxant.  May take 2 at bedtime.  Caution, may cause drowsiness Patient not taking: Reported on 09/15/2018 01/15/18   Jacqulyn Cane, MD  fluticasone [facility]) 50 MCG/ACT nasal spray Place 2 sprays into both nostrils daily. Patient not taking: Reported on 09/15/2018 02/07/17   Tyrell Antonio    Family History Family History  Problem Relation Age of Onset  . Multiple sclerosis Mother   . Hypertension Father   . Cancer Father     Social History Social History   Tobacco Use  . Smoking status: Never Smoker  . Smokeless tobacco:  Never Used  Substance Use Topics  . Alcohol use: No  . Drug use: No     Allergies   Hydrocodone; Penicillins; Codeine; and Hydrocodone-acetaminophen   Review of Systems Review of Systems  Constitutional: Negative for chills and fever.  HENT: Negative for ear pain and sore throat.   Eyes: Negative for pain and visual disturbance.  Respiratory: Negative for cough and shortness of breath.   Cardiovascular: Negative for chest pain and palpitations.  Gastrointestinal: Positive for abdominal pain and anorexia. Negative for constipation,  diarrhea, melena and vomiting.  Genitourinary: Negative for dysuria, frequency and hematuria.  Musculoskeletal: Negative for arthralgias and back pain.  Skin: Negative for color change and rash.  Neurological: Negative for seizures and syncope.  All other systems reviewed and are negative.    Physical Exam Updated Vital Signs BP (!) 144/95 (BP Location: Right Arm)   Pulse 82   Temp 98.5 F (36.9 C) (Oral)   Resp 15   Ht 5\' 8"  (1.727 m)   Wt 104.3 kg   SpO2 98%   BMI 34.97 kg/m   Physical Exam Vitals signs and nursing note reviewed.  Constitutional:      Appearance: He is well-developed.  HENT:     Head: Normocephalic and atraumatic.     Mouth/Throat:     Mouth: Mucous membranes are moist.  Eyes:     Extraocular Movements: Extraocular movements intact.     Conjunctiva/sclera: Conjunctivae normal.  Neck:     Musculoskeletal: Neck supple.  Cardiovascular:     Rate and Rhythm: Normal rate and regular rhythm.     Heart sounds: Normal heart sounds. No murmur.  Pulmonary:     Effort: Pulmonary effort is normal. No respiratory distress.     Breath sounds: Normal breath sounds.  Abdominal:     General: Bowel sounds are normal.     Palpations: Abdomen is soft.     Tenderness: There is abdominal tenderness in the epigastric area. There is no guarding or rebound. Negative signs include Murphy's sign and Rovsing's sign.     Hernia: No hernia is present.  Skin:    General: Skin is warm and dry.     Capillary Refill: Capillary refill takes less than 2 seconds.  Neurological:     General: No focal deficit present.     Mental Status: He is alert.      ED Treatments / Results  Labs (all labs ordered are listed, but only abnormal results are displayed) Labs Reviewed  LIPASE, BLOOD - Abnormal; Notable for the following components:      Result Value   Lipase 77 (*)    All other components within normal limits  COMPREHENSIVE METABOLIC PANEL - Abnormal; Notable for the  following components:   Glucose, Bld 174 (*)    All other components within normal limits  CBC - Abnormal; Notable for the following components:   WBC 12.2 (*)    All other components within normal limits  URINALYSIS, ROUTINE W REFLEX MICROSCOPIC - Abnormal; Notable for the following components:   Ketones, ur 5 (*)    All other components within normal limits  LIPID PANEL - Abnormal; Notable for the following components:   HDL 37 (*)    All other components within normal limits  MAGNESIUM  PHOSPHORUS  HEMOGLOBIN A1C  COMPREHENSIVE METABOLIC PANEL  CBC  LIPASE, BLOOD  HIV ANTIBODY (ROUTINE TESTING W REFLEX)  TSH    EKG None  Radiology Ct Abdomen Pelvis W Contrast  Result Date: 09/15/2018 CLINICAL DATA:  Right lower quadrant abdominal pain and distension for 1 day. EXAM: CT ABDOMEN AND PELVIS WITH CONTRAST TECHNIQUE: Multidetector CT imaging of the abdomen and pelvis was performed using the standard protocol following bolus administration of intravenous contrast. CONTRAST:  162mL ISOVUE-300 IOPAMIDOL (ISOVUE-300) INJECTION 61% COMPARISON:  01/09/2017. FINDINGS: Lower chest: The previously demonstrated 5 mm right lower lobe nodules unchanged on image number 29 series 6. Otherwise, clear lung bases. Normal sized heart. Hepatobiliary: Marked diffuse low density of the liver relative to the spleen with little change. 4 mm gallstone in the gallbladder. No gallbladder wall thickening or pericholecystic fluid. Pancreas: Interval mild peripancreatic edema adjacent to the tail of the pancreas. Spleen: Normal in size without focal abnormality. Adrenals/Urinary Tract: Normal appearing adrenal glands. Small amount of excreted contrast in both renal collecting systems. This makes it difficult to exclude a renal calculus on either side. No bladder or ureteral calculi and no hydronephrosis. Stomach/Bowel: Stomach is within normal limits. Appendix appears normal. No evidence of bowel wall thickening,  distention, or inflammatory changes. Vascular/Lymphatic: No significant vascular findings are present. No enlarged abdominal or pelvic lymph nodes. Reproductive: Prostate is unremarkable. Other: Small left inguinal hernia containing fat. Musculoskeletal: Lumbar and lower thoracic spine degenerative changes. IMPRESSION: 1. Mild interval peripancreatic edema adjacent to the tail of the pancreas, compatible with acute pancreatitis. 2. Normal appearing appendix without evidence of appendicitis. 3. Stable marked diffuse hepatic steatosis. 4. Cholelithiasis without evidence of cholecystitis. 5. Stable 5 mm right lower lobe nodule. No follow-up needed if patient is low-risk. Non-contrast chest CT can be considered in 12 months if patient is high-risk. This recommendation follows the consensus statement: Guidelines for Management of Incidental Pulmonary Nodules Detected on CT Images: From the Fleischner Society 2017; Radiology 2017; 284:228-243. Electronically Signed   By: Claudie Revering M.D.   On: 09/15/2018 14:05   US Abdomen Limited Ruq  Result Date: 09/15/2018 CLINICAL DATA:  Abdominal pain for 1 week.  Acute pancreatitis. EXAM: ULTRASOUND ABDOMEN LIMITED RIGHT UPPER QUADRANT COMPARISON:  CT on 09/15/2018 FINDINGS: Gallbladder: At least 1 small calculus is seen in the gallbladder neck measuring approximately 5 mm. No evidence of gallbladder dilatation or wall thickening. No evidence of pericholecystic fluid. No sonographic Murphy sign noted by sonographer. Common bile duct: Diameter: 4 mm, within normal limits. Liver: Diffusely increased echogenicity of the hepatic parenchyma, consistent with hepatic steatosis. No focal mass lesion identified. Portal vein is patent on color Doppler imaging with normal direction of blood flow towards the liver. IMPRESSION: Cholelithiasis, without sonographic signs of cholecystitis or biliary ductal dilatation. Marked hepatic steatosis. Electronically Signed   By: Earle Gell M.D.    On: 09/15/2018 15:56    Procedures .Critical Care Performed by: Lennice Sites, DO Authorized by: Lennice Sites, DO   Critical care provider statement:    Critical care time (minutes):  35   Critical care time was exclusive of:  Separately billable procedures and treating other patients and teaching time   Critical care was necessary to treat or prevent imminent or life-threatening deterioration of the following conditions:  Dehydration   Critical care was time spent personally by me on the following activities:  Development of treatment plan with patient or surrogate, discussions with primary provider, examination of patient, evaluation of patient's response to treatment, obtaining history from patient or surrogate, ordering and performing treatments and interventions, ordering and review of laboratory studies, ordering and review of radiographic studies, pulse oximetry, re-evaluation of patient's condition and  review of old charts   I assumed direction of critical care for this patient from another provider in my specialty: no     (including critical care time)  Medications Ordered in ED Medications  sodium chloride (PF) 0.9 % injection (has no administration in time range)  iopamidol (ISOVUE-300) 61 % injection (has no administration in time range)  atorvastatin (LIPITOR) tablet 10 mg (has no administration in time range)  pantoprazole (PROTONIX) EC tablet 40 mg (has no administration in time range)  enoxaparin (LOVENOX) injection 40 mg (has no administration in time range)  0.9 %  sodium chloride infusion ( Intravenous New Bag/Given 09/15/18 1754)  acetaminophen (TYLENOL) tablet 650 mg (has no administration in time range)    Or  acetaminophen (TYLENOL) suppository 650 mg (has no administration in time range)  fentaNYL (SUBLIMAZE) injection 25 mcg (has no administration in time range)  polyethylene glycol (MIRALAX / GLYCOLAX) packet 17 g (has no administration in time range)   ondansetron (ZOFRAN) tablet 4 mg (has no administration in time range)    Or  ondansetron (ZOFRAN) injection 4 mg (has no administration in time range)  albuterol (PROVENTIL) (2.5 MG/3ML) 0.083% nebulizer solution 2.5 mg (has no administration in time range)  lactated ringers bolus 1,000 mL (0 mLs Intravenous Stopped 09/15/18 1451)  ondansetron (ZOFRAN) injection 4 mg (4 mg Intravenous Given 09/15/18 1248)  fentaNYL (SUBLIMAZE) injection 50 mcg (50 mcg Intravenous Given 09/15/18 1249)  iopamidol (ISOVUE-300) 61 % injection 100 mL (100 mLs Intravenous Contrast Given 09/15/18 1332)  lactated ringers bolus 1,000 mL (0 mLs Intravenous Stopped 09/15/18 1637)  fentaNYL (SUBLIMAZE) injection 50 mcg (50 mcg Intravenous Given 09/15/18 1519)     Initial Impression / Assessment and Plan / ED Course  I have reviewed the triage vital signs and the nursing notes.  Pertinent labs & imaging results that were available during my care of the patient were reviewed by me and considered in my medical decision making (see chart for details).     DONYE CAMPANELLI is a 52 year old male with history of hypertension who presents to the ED with abdominal pain.  Patient with normal vitals.  No fever.  Patient epigastric abdominal pain that is worse with eating for the last 2 days. Pain in epigastric region, elevated lipase.  Gallbladder and liver enzymes within normal limits.  No significant anemia, electrolyte abnormality, kidney injury.  Patient given IV bolus x1, IV fentanyl.  CT scan showed mild acute pancreatitis.  No history of the same.  Does not use alcohol.  Right upper quadrant ultrasound did not show any biliary duct dilation, CBD dilation.  Unknown cause of pancreatitis at this time.  Patient continues to have pain.  Will give additional lactated Ringer bolus.  IV fentanyl.  Admit to the hospitalist for further pancreatitis work-up.  Hemodynamically stable throughout my care.  Patient did have mild  leukocytosis.  This chart was dictated using voice recognition software.  Despite best efforts to proofread,  errors can occur which can change the documentation meaning.   Final Clinical Impressions(s) / ED Diagnoses   Final diagnoses:  Pain  Acute pancreatitis, unspecified complication status, unspecified pancreatitis type    ED Discharge Orders    None       Lennice Sites, DO 09/15/18 1849

## 2018-09-15 NOTE — ED Provider Notes (Signed)
[name]    CSN: [id number] Arrival date & time: 09/15/18  1023     History   Chief Complaint Chief Complaint  Patient presents with  . Abdominal Pain    HPI Robert Schultz is a 52 y.o. male.   HPI Robert Schultz is a 52 y.o. male presenting to UC with c/o 1 week of gradually worsening abdominal bloating and pressure, associated small bowel movements, nausea and fullness sensation started yesterday after a small meal of meat and veggies.  Today he feels pressure and fullness in his upper abdomen and a "small pinch" in his LLQ. He tried GasX and walking around last night w/o relief. He is already taking omeprazole.  He has not tried stool softeners yet. Hx of diverticulitis 20 years ago but does not recall what symptoms he had at that time.  Denies fever, chills, vomiting or diarrhea. No hx of abdominal surgeries. Pt was seen at [facility] on 09/11/18 for palpitations, he had a normal workup here but was advised his palpitations were likely due to occasional PVCs.  Pt denies chest pain or palpitations at this time.    Past Medical History:  Diagnosis Date  . AV block   . Cervical radicular pain   . Diverticulitis of colon   . Gastritis   . HTN (hypertension)   . Hyperlipidemia   . Morbid obesity ([place])   . Normal coronary angiogram 2004  . Palpitations   . Radiculitis of leg   . Sleep apnea   . Thoracic spine pain     Patient Active Problem List   Diagnosis Date Noted  . CAP (community acquired pneumonia) 07/02/2014  . Sepsis ([place]) 07/02/2014  . DM (diabetes mellitus) ([place]) 08/26/2013  . Numbness 08/26/2013  . Cardiovascular risk factor 04/04/2011  . HTN (hypertension) 03/08/2011  . Heart palpitations 03/08/2011  . Hyperlipidemia 03/08/2011  . OBSTRUCTIVE SLEEP APNEA 05/19/2009  . 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 NASAL POLYP 05/19/2009  . MIGRAINES, HX OF 05/19/2009    Past Surgical History:  Procedure Laterality Date  . CARDIAC CATHETERIZATION  11/17/2002   NORMAL. EF 65%       Home Medications    Prior to Admission medications   Medication Sig Start Date End Date Taking? Authorizing Provider  acetaminophen (TYLENOL) 325 MG tablet Take 2 tablets (650 mg total) by mouth every 6 (six) hours as needed. 12/16/16   Varney Biles, MD  cyclobenzaprine (FLEXERIL) 5 MG tablet Take 1 tablet (5 mg total) by mouth 3 (three) times daily. For muscle relaxant.  May take 2 at bedtime.  Caution, may cause drowsiness 01/15/18   Jacqulyn Cane, MD  fluticasone [facility]) 50 MCG/ACT nasal spray Place 2 sprays into both nostrils daily. 02/07/17   Noe Gens, PA-C  ibuprofen (ADVIL,MOTRIN) 200 MG tablet Take 200 mg by mouth every 6 (six) hours as needed for moderate pain.    [provider]  metFORMIN (GLUCOPHAGE-XR) 500 MG 24 hr tablet Take 1,000 mg by mouth every evening. 12/12/16   [provider]  omeprazole (PRILOSEC) 20 MG capsule Take 1 capsule (20 mg total) by mouth daily. 12/16/16   Varney Biles, MD  Tetrahydrozoline HCl (VISINE OP) Place 1 drop into both eyes daily as needed (dry eyes).    [provider]  Vitamin D, Ergocalciferol, (DRISDOL) 50000 units CAPS capsule Take 50,000 Units by mouth once a week. 12/13/16   [provider]    Family History Family History  Problem Relation Age  of Onset  . Multiple sclerosis Mother   . Hypertension Father   . Cancer Father     Social History Social History   Tobacco Use  . Smoking status: Never Smoker  . Smokeless tobacco: Never Used  Substance Use Topics  . Alcohol use: No  . Drug use: No     Allergies   Hydrocodone; Penicillins; and Codeine   Review of Systems Review of Systems  Constitutional: Positive for appetite change (loss). Negative for chills and fever.  Gastrointestinal: Positive for abdominal distention ( bloating), abdominal pain, constipation and nausea. Negative for diarrhea and vomiting.  Genitourinary: Negative for dysuria, flank pain and  frequency.  Musculoskeletal: Negative for back pain and myalgias.     Physical Exam Triage Vital Signs ED Triage Vitals  Enc Vitals Group     BP 09/15/18 1051 136/89     Pulse Rate 09/15/18 1051 86     Resp 09/15/18 1051 18     Temp 09/15/18 1051 98.6 F (37 C)     Temp Source 09/15/18 1051 Oral     SpO2 09/15/18 1051 97 %     Weight 09/15/18 1052 235 lb (106.6 kg)     Height 09/15/18 1052 5\' 8"  (1.727 m)     Head Circumference --      Peak Flow --      Pain Score 09/15/18 1052 4     Pain Loc --      Pain Edu? --      Excl. in [place]? --    No data found.  Updated Vital Signs BP 136/89 (BP Location: Right Arm)   Pulse 86   Temp 98.6 F (37 C) (Oral)   Resp 18   Ht 5\' 8"  (1.727 m)   Wt 235 lb (106.6 kg)   SpO2 97%   BMI 35.73 kg/m   Visual Acuity Right Eye Distance:   Left Eye Distance:   Bilateral Distance:    Right Eye Near:   Left Eye Near:    Bilateral Near:     Physical Exam Vitals signs and nursing note reviewed.  Constitutional:      Appearance: He is well-developed.  HENT:     Head: Normocephalic and atraumatic.  Neck:     Musculoskeletal: Normal range of motion.  Cardiovascular:     Rate and Rhythm: Normal rate and regular rhythm.  Pulmonary:     Effort: Pulmonary effort is normal.     Breath sounds: Normal breath sounds.  Abdominal:     Palpations: Abdomen is soft.     Tenderness: There is abdominal tenderness in the right lower quadrant and periumbilical area. There is guarding. There is no right CVA tenderness, left CVA tenderness or rebound. Positive signs include McBurney's sign. Negative signs include Murphy's sign, Rovsing's sign and psoas sign.  Musculoskeletal: Normal range of motion.  Skin:    General: Skin is warm and dry.  Neurological:     Mental Status: He is alert and oriented to person, place, and time.  Psychiatric:        Behavior: Behavior normal.      UC Treatments / Results  Labs (all labs ordered are listed, but  only abnormal results are displayed) Labs Reviewed - No data to display  EKG None  Radiology No results found.  Procedures Procedures (including critical care time)  Medications Ordered in UC Medications - No data to display  Initial Impression / Assessment and Plan / UC Course  I have  reviewed the triage vital signs and the nursing notes.  Pertinent labs & imaging results that were available during my care of the patient were reviewed by me and considered in my medical decision making (see chart for details).     Concern for appendicitis, possible severe diverticulitis Offered EMS transport, pt declined Advised pt go directly to emergency department. Pt stable for transport via POV. Pt understanding and agreeable with plan.  Final Clinical Impressions(s) / UC Diagnoses   Final diagnoses:  Generalized abdominal pain  Right lower quadrant abdominal tenderness without rebound tenderness  Nausea without vomiting     Discharge Instructions      You have declined EMS transport to the hospital but it is still highly recommended you be evaluated immediately in the emergency department today.  Please go directly to the emergency department. Do not eat or drink along the way. There is concern you have appendicitis, which is considered a medical emergency.     ED Prescriptions    None     Controlled Substance Prescriptions [place] Controlled Substance Registry consulted? Not Applicable   Tyrell Antonio 09/15/18 1125

## 2018-09-15 NOTE — ED Triage Notes (Signed)
Seen on Sat.  Small bowel movements, feels, pressure and fullness in upper abd.  Has a small "pinch" in LLQ.

## 2018-09-15 NOTE — Discharge Instructions (Signed)
°  You have declined EMS transport to the hospital but it is still highly recommended you be evaluated immediately in the emergency department today.  Please go directly to the emergency department. Do not eat or drink along the way. There is concern you have appendicitis, which is considered a medical emergency.

## 2018-09-15 NOTE — ED Notes (Signed)
Save blue tube in main lab °

## 2018-09-15 NOTE — ED Triage Notes (Signed)
Pt c/i mid upper abd pains for week with nausea. Pt reports was at PCP and when pressed down on RLQ pt reports pain was so bad. Denies v/d or urinary problems

## 2018-09-15 NOTE — H&P (Signed)
History and Physical    Robert Schultz KAJ:[id number] DOB: [birth year] DOA: 09/15/2018  PCP: London Pepper, MD   Patient coming from: Home  Chief Complaint: Stomach Pain  HPI: Robert Schultz is a 52 y.o. male with medical history significant of AV block, gastritis, hypertension, hyperlipidemia, obesity, obstructive sleep apnea, history of spinal pain, along with other comorbidities including palpitations who presents with a chief complaint of abdominal pain. He states Abdominal Pain/Stomach pain and was bothersome for a week got bad yesterday. Pain was a burning and burning and stabbing. "Full and tight and bloated." Yesterday ate and made it a lot worse (Pot roast and vegetables with green beans and corn.) Chest Cavity also hurting. Went to Urgent care last week and per patient " had a full Cardiac Workup" and was revealed to have an allergic reaction to a pain medication. Was diagnosed with bronchitis a week and a half ago.  Pain was a 5/10  On admission and now a 3/10. Patient was nauseous but no vomiting. Workup Revealed Mild Acute Pancreatitis so [facility] was called for Admission.  ED Course: In 2 L of lactated Ringer's in the ED and placed on maintenance IV fluid.  CT scan of the abdomen was done and patient also had basic blood work done.  Lipase level was elevated.  EDP also ordered an ultrasound of the right upper quadrant  Review of Systems: As per HPI otherwise 10 point review of systems negative.   Past Medical History:  Diagnosis Date  . AV block   . Cervical radicular pain   . Diverticulitis of colon   . Gastritis   . HTN (hypertension)   . Hyperlipidemia   . Morbid obesity ([place])   . Normal coronary angiogram 2004  . Palpitations   . Radiculitis of leg   . Sleep apnea   . Thoracic spine pain    Past Surgical History:  Procedure Laterality Date  . CARDIAC CATHETERIZATION  11/17/2002   NORMAL. EF 65%   SOCIAL HISTORY   reports that he has never smoked. He has never used  smokeless tobacco. He reports that he does not drink alcohol or use drugs.  Allergies  Allergen Reactions  . Hydrocodone Other (See Comments)    "I break into a violent sweat--fast."  . Penicillins     Profuse sweating Swelling of the face/tongue/throat, SOB, or low BP? N Sudden or severe rash/hives, skin peeling, or the inside of the mouth or nose? n Did it require medical treatment? N When did it last happen?unk If all above answers are "NO", may proceed with cephalosporin use.   . Codeine   . Hydrocodone-Acetaminophen Swelling   Family History  Problem Relation Age of Onset  . Multiple sclerosis Mother   . Hypertension Father   . Cancer Father    Prior to Admission medications   Medication Sig Start Date End Date Taking? Authorizing Provider  acetaminophen (TYLENOL) 325 MG tablet Take 2 tablets (650 mg total) by mouth every 6 (six) hours as needed. 12/16/16  Yes Varney Biles, MD  atorvastatin (LIPITOR) 10 MG tablet Take 10 mg by mouth daily.   Yes [provider]  ibuprofen (ADVIL,MOTRIN) 200 MG tablet Take 200 mg by mouth every 6 (six) hours as needed for moderate pain.   Yes [provider]  metFORMIN (GLUCOPHAGE-XR) 500 MG 24 hr tablet Take 500 mg by mouth every evening.  12/12/16  Yes [provider]  omeprazole (PRILOSEC) 20 MG capsule Take 1 capsule (  20 mg total) by mouth daily. 12/16/16  Yes Varney Biles, MD  cyclobenzaprine (FLEXERIL) 5 MG tablet Take 1 tablet (5 mg total) by mouth 3 (three) times daily. For muscle relaxant.  May take 2 at bedtime.  Caution, may cause drowsiness Patient not taking: Reported on 09/15/2018 01/15/18   Jacqulyn Cane, MD  fluticasone [facility]) 50 MCG/ACT nasal spray Place 2 sprays into both nostrils daily. Patient not taking: Reported on 09/15/2018 02/07/17   Noe Gens, [place]   Physical Exam: Vitals:   09/15/18 1430 09/15/18 1600 09/15/18 1630 09/15/18 1730  BP: (!) 153/101 135/88 (!) 152/100 (!)  144/95  Pulse: 81 79 81 82  Resp: 16 20 (!) 21 15  Temp:    98.5 F (36.9 C)  TempSrc:    Oral  SpO2: 98% 98% 99% 98%  Weight:    104.3 kg  Height:    5\' 8"  (1.727 m)   Constitutional: WN/WD obese Caucasian male in NAD and appears calm and slightly uncomfortable Eyes: Lids and conjunctivae normal, sclerae anicteric  ENMT: External Ears, Nose appear normal. Grossly normal hearing. Mucous membranes are a little dry Neck: Appears normal, supple, no cervical masses, normal ROM, no appreciable thyromegaly; no JVD Respiratory: Diminished to auscultation bilaterally, no wheezing, rales, rhonchi or crackles. Normal respiratory effort and patient is not tachypenic. No accessory muscle use.  Cardiovascular: RRR, no murmurs / rubs / gallops. S1 and S2 auscultated.  Abdomen: Soft,Tender to palpate in the mid-epigastric area, Distended due to body habitus. No masses palpated. No appreciable hepatosplenomegaly. Bowel sounds positive x4.  GU: Deferred. Musculoskeletal: No clubbing / cyanosis of digits/nails. No joint deformity upper and lower extremities.  Skin: No rashes, lesions, ulcers on a limited skin evaluation. No induration; Warm and dry.  Neurologic: CN 2-12 grossly intact with no focal deficits.  Romberg sign and cerebellar reflexes not assessed.  Psychiatric: Normal judgment and insight. Alert and oriented x 3. Normal mood and appropriate affect.   Labs on Admission: I have personally reviewed following labs and imaging studies  CBC: Recent Labs  Lab 09/15/18 1202  WBC 12.2*  HGB 15.0  HCT 45.8  MCV 99.8  PLT 341   Basic Metabolic Panel: Recent Labs  Lab 09/15/18 1202  NA 136  K 4.4  CL 99  CO2 27  GLUCOSE 174*  BUN 13  CREATININE 0.86  CALCIUM 9.1   GFR: Estimated Creatinine Clearance: 120.3 mL/min (by C-G formula based on SCr of 0.86 mg/dL). Liver Function Tests: Recent Labs  Lab 09/15/18 1202  AST 26  ALT 36  ALKPHOS 46  BILITOT 1.1  PROT 7.8  ALBUMIN 4.5    Recent Labs  Lab 09/15/18 1202  LIPASE 77*   No results for input(s): AMMONIA in the last 168 hours. Coagulation Profile: No results for input(s): INR, PROTIME in the last 168 hours. Cardiac Enzymes: No results for input(s): CKTOTAL, CKMB, CKMBINDEX, TROPONINI in the last 168 hours. BNP (last 3 results) No results for input(s): PROBNP in the last 8760 hours. HbA1C: No results for input(s): HGBA1C in the last 72 hours. CBG: No results for input(s): GLUCAP in the last 168 hours. Lipid Profile: Recent Labs    09/15/18 1202  CHOL 127  HDL 37*  LDLCALC 66  TRIG 122  CHOLHDL 3.4   Thyroid Function Tests: No results for input(s): TSH, T4TOTAL, FREET4, T3FREE, THYROIDAB in the last 72 hours. Anemia Panel: No results for input(s): VITAMINB12, FOLATE, FERRITIN, TIBC, IRON, RETICCTPCT in the last 72  hours. Urine analysis:    Component Value Date/Time   COLORURINE YELLOW 09/15/2018 [place] 09/15/2018 1234   LABSPEC 1.021 09/15/2018 1234   PHURINE 6.0 09/15/2018 1234   GLUCOSEU NEGATIVE 09/15/2018 1234   HGBUR NEGATIVE 09/15/2018 1234   BILIRUBINUR NEGATIVE 09/15/2018 1234   BILIRUBINUR negative 08/11/2013 1605   BILIRUBINUR neg 06/24/2013 1208   KETONESUR 5 (A) 09/15/2018 1234   PROTEINUR NEGATIVE 09/15/2018 1234   UROBILINOGEN 1.0 07/02/2014 1415   NITRITE NEGATIVE 09/15/2018 1234   LEUKOCYTESUR NEGATIVE 09/15/2018 1234   Sepsis Labs: !!!!!!!!!!!!!!!!!!!!!!!!!!!!!!!!!!!!!!!!!!!! @LABRCNTIP (procalcitonin:4,lacticidven:4) )No results found for this or any previous visit (from the past 240 hour(s)).   Radiological Exams on Admission: Ct Abdomen Pelvis W Contrast  Result Date: 09/15/2018 CLINICAL DATA:  Right lower quadrant abdominal pain and distension for 1 day. EXAM: CT ABDOMEN AND PELVIS WITH CONTRAST TECHNIQUE: Multidetector CT imaging of the abdomen and pelvis was performed using the standard protocol following bolus administration of intravenous  contrast. CONTRAST:  149mL ISOVUE-300 IOPAMIDOL (ISOVUE-300) INJECTION 61% COMPARISON:  01/09/2017. FINDINGS: Lower chest: The previously demonstrated 5 mm right lower lobe nodules unchanged on image number 29 series 6. Otherwise, clear lung bases. Normal sized heart. Hepatobiliary: Marked diffuse low density of the liver relative to the spleen with little change. 4 mm gallstone in the gallbladder. No gallbladder wall thickening or pericholecystic fluid. Pancreas: Interval mild peripancreatic edema adjacent to the tail of the pancreas. Spleen: Normal in size without focal abnormality. Adrenals/Urinary Tract: Normal appearing adrenal glands. Small amount of excreted contrast in both renal collecting systems. This makes it difficult to exclude a renal calculus on either side. No bladder or ureteral calculi and no hydronephrosis. Stomach/Bowel: Stomach is within normal limits. Appendix appears normal. No evidence of bowel wall thickening, distention, or inflammatory changes. Vascular/Lymphatic: No significant vascular findings are present. No enlarged abdominal or pelvic lymph nodes. Reproductive: Prostate is unremarkable. Other: Small left inguinal hernia containing fat. Musculoskeletal: Lumbar and lower thoracic spine degenerative changes. IMPRESSION: 1. Mild interval peripancreatic edema adjacent to the tail of the pancreas, compatible with acute pancreatitis. 2. Normal appearing appendix without evidence of appendicitis. 3. Stable marked diffuse hepatic steatosis. 4. Cholelithiasis without evidence of cholecystitis. 5. Stable 5 mm right lower lobe nodule. No follow-up needed if patient is low-risk. Non-contrast chest CT can be considered in 12 months if patient is high-risk. This recommendation follows the consensus statement: Guidelines for Management of Incidental Pulmonary Nodules Detected on CT Images: From the Fleischner Society 2017; Radiology 2017; 284:228-243. Electronically Signed   By: Claudie Revering M.D.    On: 09/15/2018 14:05   US Abdomen Limited Ruq  Result Date: 09/15/2018 CLINICAL DATA:  Abdominal pain for 1 week.  Acute pancreatitis. EXAM: ULTRASOUND ABDOMEN LIMITED RIGHT UPPER QUADRANT COMPARISON:  CT on 09/15/2018 FINDINGS: Gallbladder: At least 1 small calculus is seen in the gallbladder neck measuring approximately 5 mm. No evidence of gallbladder dilatation or wall thickening. No evidence of pericholecystic fluid. No sonographic Murphy sign noted by sonographer. Common bile duct: Diameter: 4 mm, within normal limits. Liver: Diffusely increased echogenicity of the hepatic parenchyma, consistent with hepatic steatosis. No focal mass lesion identified. Portal vein is patent on color Doppler imaging with normal direction of blood flow towards the liver. IMPRESSION: Cholelithiasis, without sonographic signs of cholecystitis or biliary ductal dilatation. Marked hepatic steatosis. Electronically Signed   By: Earle Gell M.D.   On: 09/15/2018 15:56    EKG: Independently reviewed. Sowed a Sinus Rhythm of  a rate of 78 and a QTc of 434. No evidence of ST elevation on my interpretation.   Assessment/Plan Active Problems:   OBSTRUCTIVE SLEEP APNEA   HTN (hypertension)   Hyperlipidemia   Cardiovascular risk factor   DM (diabetes mellitus) (HCC)   Pancreatitis   Hepatic steatosis   Obesity   GERD (gastroesophageal reflux disease)   Acute Pancreatitis  -Observation telemetry and placed on a clear liquid diet -Advance diet as tolerated and control pain -Continue IV fluid resuscitation patient was given 2 L in the ED and will give additional normal saline at a rate of 100 and mils per hour for 24 hours -Pain control with IV fentanyl -Continue with antiemetics -CT scan of the abdomen pelvis showed mild interval peripancreatic edema adjacent to the tail of the pancreas which is compatible with acute pancreatitis -Unclear etiology of his pancreatitis as he states that he does not drink, and has not  had any trauma -Cholelithiasis is a possibility and choledocholithiasis however CT scan did not show any ductal dilatation and neither did the ultrasound -Check fasting lipid panel to evaluate for triglycerides -Lipase was not very much elevated and was 77 -Continue to Monitor and C/w Fluid Resuscitation -IF not Improving may consider MRCP and GI Consultation  -WBC on Admission was 12.2 -Continue to Monitor   Hepatic steatosis -Patient's CT scan showed stable marked diffuse hepatic steatosis and ultrasound of the right upper quadrant showed marked hepatic steatosis and cholelithiasis without sonographic signs for cholecystitis or biliary ductal dilatation -Advised low-fat diet -Continue monitor CMP and repeat and trend liver functions  Obesity -Estimated body mass index is 34.97 kg/m as calculated from the following:   Height as of this encounter: 5\' 8"  (1.727 m).   Weight as of this encounter: 104.3 kg. -Weight Loss Counseling given    HLD -Takes Atorvastain 10 mg po qHS  HTN -Recently started on Lisiniopril but stopped  -Continue to Monitor BP's per protocol -Place on PRN Hydralazine if necessary  Diabetes Mellitus Type 2 -Takes 1000 mg of Metoformin BID -Place on Sensitive Novolog SSI AC/HS -Continue to Monitor CBGs  GERD -Takes Omeprazole and will continue with Pantoprazole Substitution   OSA -C/w Home CPAP  DVT prophylaxis: Enoxaparin 40 mg sq q24h Code Status: FULL CODE Family Communication: No family present at bedside Disposition Plan: Anticipate D/C Home when Abdominal Pain is improved and patient tolerating food without any pain Consults called: None Admission status: Obs Telemetry  Severity of Illness: The appropriate patient status for this patient is OBSERVATION. Observation status is judged to be reasonable and necessary in order to provide the required intensity of service to ensure the patient's safety. The patient's presenting symptoms, physical exam  findings, and initial radiographic and laboratory data in the context of their medical condition is felt to place them at decreased risk for further clinical deterioration. Furthermore, it is anticipated that the patient will be medically stable for discharge from the hospital within 2 midnights of admission. The following factors support the patient status of observation.   " The patient's presenting symptoms include Abdominal Pain. " The physical exam findings include Mild tenderness on palpation and mild dehydration. " The initial radiographic and laboratory data are concerning for acute pancreatitis.  Kerney Elbe, D.O. [facility] PAGER is on [place]  If 7PM-7AM, please contact night-coverage [email or web address] Password [facility]  09/15/2018, 7:45 PM

## 2018-09-15 NOTE — ED Notes (Signed)
Report given to Pam, RN.

## 2018-09-16 DIAGNOSIS — R109 Unspecified abdominal pain: Secondary | ICD-10-CM | POA: Diagnosis not present

## 2018-09-16 DIAGNOSIS — K859 Acute pancreatitis without necrosis or infection, unspecified: Secondary | ICD-10-CM | POA: Diagnosis present

## 2018-09-16 DIAGNOSIS — I10 Essential (primary) hypertension: Secondary | ICD-10-CM | POA: Diagnosis not present

## 2018-09-16 DIAGNOSIS — E860 Dehydration: Secondary | ICD-10-CM | POA: Diagnosis not present

## 2018-09-16 DIAGNOSIS — Z9189 Other specified personal risk factors, not elsewhere classified: Secondary | ICD-10-CM | POA: Diagnosis not present

## 2018-09-16 LAB — COMPREHENSIVE METABOLIC PANEL
ALT: 26 U/L (ref 0–44)
AST: 16 U/L (ref 15–41)
Albumin: 3.9 g/dL (ref 3.5–5.0)
Alkaline Phosphatase: 43 U/L (ref 38–126)
Anion gap: 11 (ref 5–15)
BUN: 7 mg/dL (ref 6–20)
CO2: 25 mmol/L (ref 22–32)
Calcium: 8.8 mg/dL — ABNORMAL LOW (ref 8.9–10.3)
Chloride: 102 mmol/L (ref 98–111)
Creatinine, Ser: 0.77 mg/dL (ref 0.61–1.24)
GFR calc Af Amer: >60 mL/min (ref >60)
GFR calc non Af Amer: >60 mL/min (ref >60)
Glucose, Bld: 178 mg/dL — ABNORMAL HIGH (ref 70–99)
Potassium: 4.1 mmol/L (ref 3.5–5.1)
Sodium: 138 mmol/L (ref 135–145)
Total Bilirubin: 0.8 mg/dL (ref 0.3–1.2)
Total Protein: 7.1 g/dL (ref 6.5–8.1)

## 2018-09-16 LAB — CBC
HCT: 42.5 % (ref 39.0–52.0)
Hemoglobin: 13.9 g/dL (ref 13.0–17.0)
MCH: 33.1 pg (ref 26.0–34.0)
MCHC: 32.7 g/dL (ref 30.0–36.0)
MCV: 101.2 fL — ABNORMAL HIGH (ref 80.0–100.0)
Platelets: 215 10*3/uL (ref 150–400)
RBC: 4.2 MIL/uL — ABNORMAL LOW (ref 4.22–5.81)
RDW: 12.4 % (ref 11.5–15.5)
WBC: 11.6 10*3/uL — ABNORMAL HIGH (ref 4.0–10.5)
nRBC: 0 % (ref 0.0–0.2)

## 2018-09-16 LAB — TSH: TSH: 0.639 u[IU]/mL (ref 0.350–4.500)

## 2018-09-16 LAB — PHOSPHORUS: Phosphorus: 3.1 mg/dL (ref 2.5–4.6)

## 2018-09-16 LAB — GLUCOSE, CAPILLARY
Glucose-Capillary: 142 mg/dL — ABNORMAL HIGH (ref 70–99)
Glucose-Capillary: 184 mg/dL — ABNORMAL HIGH (ref 70–99)

## 2018-09-16 LAB — LIPASE, BLOOD: Lipase: 49 U/L (ref 11–51)

## 2018-09-16 LAB — MAGNESIUM: Magnesium: 1.9 mg/dL (ref 1.7–2.4)

## 2018-09-16 NOTE — Discharge Summary (Signed)
Physician Discharge Summary  Robert Schultz AOZ:[id number] DOB: 10-23-1965 DOA: 09/15/2018  PCP: London Pepper, MD  Admit date: 09/15/2018 Discharge date: 09/16/2018  Admitted From home Disposition: Home home Recommendations for Outpatient Follow-up:  1. Follow up with PCP in 1-2 weeks 2. Please obtain BMP/CBC/lipase in one week   Home Health none Equipment/Devices: None  Discharge Condition stable CODE STATUS full code Diet recommendation: Cardiac  Brief/Interim Summary:52 y.o. male with medical history significant of AV block, gastritis, hypertension, hyperlipidemia, obesity, obstructive sleep apnea, history of spinal pain, along with other comorbidities including palpitations who presents with a chief complaint of abdominal pain. He states Abdominal Pain/Stomach pain and was bothersome for a week got bad yesterday. Pain was a burning and burning and stabbing. "Full and tight and bloated." Yesterday ate and made it a lot worse (Pot roast and vegetables with green beans and corn.) Chest Cavity also hurting. Went to Urgent care last week and per patient " had a full Cardiac Workup" and was revealed to have an allergic reaction to a pain medication. Was diagnosed with bronchitis a week and a half ago.  Pain was a 5/10  On admission and now a 3/10. Patient was nauseous but no vomiting. Workup Revealed Mild Acute Pancreatitis so [facility] was called for Admission.  ED Course: In 2 L of lactated Ringer's in the ED and placed on maintenance IV fluid.  CT scan of the abdomen was done and patient also had basic blood work done.  Lipase level was elevated.  EDP also ordered an ultrasound of the right upper quadrant  Discharge Diagnoses:  Active Problems:   OBSTRUCTIVE SLEEP APNEA   HTN (hypertension)   Hyperlipidemia   Cardiovascular risk factor   DM (diabetes mellitus) (HCC)   Pancreatitis   Hepatic steatosis   Obesity   GERD (gastroesophageal reflux disease)   Acute pancreatitis    #1 acute  pancreatitis of unclear etiology patient was admitted to the floor started on IV fluids and antiemetics.  His condition improved quite quickly.  He was able to tolerate a diet prior to discharge.  CT scan of the abdomen pelvis showed peripancreatic edema adjacent to the tail of the pancreas compatible with acute pancreatitis.  Triglycerides were 122.  His lipase was elevated at 77 on the day of admission came down to the 40s on the day of discharge.  I have told him to follow a cardiac diet/low-fat diet.  CT also showed diffuse hepatic steatosis with cholelithiasis without cholecystitis or ductal dilatation.  #2 type 2 diabetes continue metformin as he was taking at home.  #3 hyperlipidemia continue atorvastatin.  #4 hypertension continue lisinopril as you are taking at home  #5 obstructive sleep apnea continue with home CPAP.                  Estimated body mass index is 35.31 kg/m as calculated from the following:   Height as of this encounter: 5\' 8"  (1.727 m).   Weight as of this encounter: 105.3 kg.  Discharge Instructions  Discharge Instructions    Call MD for:  difficulty breathing, headache or visual disturbances   Complete by:  As directed    Call MD for:  persistant nausea and vomiting   Complete by:  As directed    Call MD for:  redness, tenderness, or signs of infection (pain, swelling, redness, odor or green/yellow discharge around incision site)   Complete by:  As directed    Call MD for:  temperature >  100.4   Complete by:  As directed    Diet - low sodium heart healthy   Complete by:  As directed    Increase activity slowly   Complete by:  As directed      Allergies as of 09/16/2018      Reactions   Hydrocodone Other (See Comments)   "I break into a violent sweat--fast."   Penicillins    Profuse sweating Swelling of the face/tongue/throat, SOB, or low BP? N Sudden or severe rash/hives, skin peeling, or the inside of the mouth or nose? n Did it  require medical treatment? N When did it last happen?unk If all above answers are "NO", may proceed with cephalosporin use.   Codeine    Hydrocodone-acetaminophen Swelling      Medication List    STOP taking these medications   acetaminophen 325 MG tablet Commonly known as:  TYLENOL   cyclobenzaprine 5 MG tablet Commonly known as:  FLEXERIL   fluticasone 50 MCG/ACT nasal spray Commonly known as:  FLONASE   ibuprofen 200 MG tablet Commonly known as:  ADVIL,MOTRIN     TAKE these medications   atorvastatin 10 MG tablet Commonly known as:  LIPITOR Take 10 mg by mouth daily.   metFORMIN 500 MG 24 hr tablet Commonly known as:  GLUCOPHAGE-XR Take 500 mg by mouth every evening.   omeprazole 20 MG capsule Commonly known as:  PRILOSEC Take 1 capsule (20 mg total) by mouth daily.      Follow-up Information    London Pepper, MD Follow up.   Specialty:  Family Medicine Contact information: [address]  [address] [place] [address] [phone]          Allergies  Allergen Reactions  . Hydrocodone Other (See Comments)    "I break into a violent sweat--fast."  . Penicillins     Profuse sweating Swelling of the face/tongue/throat, SOB, or low BP? N Sudden or severe rash/hives, skin peeling, or the inside of the mouth or nose? n Did it require medical treatment? N When did it last happen?unk If all above answers are "NO", may proceed with cephalosporin use.   . Codeine   . Hydrocodone-Acetaminophen Swelling    Consultations:  None   Procedures/Studies: Ct Abdomen Pelvis W Contrast  Result Date: 09/15/2018 CLINICAL DATA:  Right lower quadrant abdominal pain and distension for 1 day. EXAM: CT ABDOMEN AND PELVIS WITH CONTRAST TECHNIQUE: Multidetector CT imaging of the abdomen and pelvis was performed using the standard protocol following bolus administration of intravenous contrast. CONTRAST:  157mL ISOVUE-300 IOPAMIDOL (ISOVUE-300)  INJECTION 61% COMPARISON:  01/09/2017. FINDINGS: Lower chest: The previously demonstrated 5 mm right lower lobe nodules unchanged on image number 29 series 6. Otherwise, clear lung bases. Normal sized heart. Hepatobiliary: Marked diffuse low density of the liver relative to the spleen with little change. 4 mm gallstone in the gallbladder. No gallbladder wall thickening or pericholecystic fluid. Pancreas: Interval mild peripancreatic edema adjacent to the tail of the pancreas. Spleen: Normal in size without focal abnormality. Adrenals/Urinary Tract: Normal appearing adrenal glands. Small amount of excreted contrast in both renal collecting systems. This makes it difficult to exclude a renal calculus on either side. No bladder or ureteral calculi and no hydronephrosis. Stomach/Bowel: Stomach is within normal limits. Appendix appears normal. No evidence of bowel wall thickening, distention, or inflammatory changes. Vascular/Lymphatic: No significant vascular findings are present. No enlarged abdominal or pelvic lymph nodes. Reproductive: Prostate is unremarkable. Other: Small left inguinal hernia  containing fat. Musculoskeletal: Lumbar and lower thoracic spine degenerative changes. IMPRESSION: 1. Mild interval peripancreatic edema adjacent to the tail of the pancreas, compatible with acute pancreatitis. 2. Normal appearing appendix without evidence of appendicitis. 3. Stable marked diffuse hepatic steatosis. 4. Cholelithiasis without evidence of cholecystitis. 5. Stable 5 mm right lower lobe nodule. No follow-up needed if patient is low-risk. Non-contrast chest CT can be considered in 12 months if patient is high-risk. This recommendation follows the consensus statement: Guidelines for Management of Incidental Pulmonary Nodules Detected on CT Images: From the Fleischner Society 2017; Radiology 2017; 284:228-243. Electronically Signed   By: Claudie Revering M.D.   On: 09/15/2018 14:05   US Abdomen Limited Ruq  Result  Date: 09/15/2018 CLINICAL DATA:  Abdominal pain for 1 week.  Acute pancreatitis. EXAM: ULTRASOUND ABDOMEN LIMITED RIGHT UPPER QUADRANT COMPARISON:  CT on 09/15/2018 FINDINGS: Gallbladder: At least 1 small calculus is seen in the gallbladder neck measuring approximately 5 mm. No evidence of gallbladder dilatation or wall thickening. No evidence of pericholecystic fluid. No sonographic Murphy sign noted by sonographer. Common bile duct: Diameter: 4 mm, within normal limits. Liver: Diffusely increased echogenicity of the hepatic parenchyma, consistent with hepatic steatosis. No focal mass lesion identified. Portal vein is patent on color Doppler imaging with normal direction of blood flow towards the liver. IMPRESSION: Cholelithiasis, without sonographic signs of cholecystitis or biliary ductal dilatation. Marked hepatic steatosis. Electronically Signed   By: Earle Gell M.D.   On: 09/15/2018 15:56    (Echo, Carotid, EGD, Colonoscopy, ERCP)    Subjective: Resting in bed was able to tolerate clear liquid diet this morning anxious to go home no abdominal pain nausea vomiting or diarrhea.  Discharge Exam: Vitals:   09/15/18 2210 09/16/18 0548  BP: (!) 139/95 (!) 132/95  Pulse: 82 83  Resp: 16 16  Temp: 97.8 F (36.6 C) 98.9 F (37.2 C)  SpO2: 97% 94%   Vitals:   09/15/18 1730 09/15/18 2210 09/16/18 0548 09/16/18 0559  BP: (!) 144/95 (!) 139/95 (!) 132/95   Pulse: 82 82 83   Resp: 15 16 16    Temp: 98.5 F (36.9 C) 97.8 F (36.6 C) 98.9 F (37.2 C)   TempSrc: Oral Oral Oral   SpO2: 98% 97% 94%   Weight: 104.3 kg   105.3 kg  Height: 5\' 8"  (1.727 m)       General: Pt is alert, awake, not in acute distress Cardiovascular: RRR, S1/S2 +, no rubs, no gallops Respiratory: CTA bilaterally, no wheezing, no rhonchi Abdominal: Soft, NT, ND, bowel sounds + Extremities: no edema, no cyanosis    The results of significant diagnostics from this hospitalization (including imaging, microbiology,  ancillary and laboratory) are listed below for reference.     Microbiology: No results found for this or any previous visit (from the past 240 hour(s)).   Labs: BNP (last 3 results) No results for input(s): BNP in the last 8760 hours. Basic Metabolic Panel: Recent Labs  Lab 09/15/18 1202 09/16/18 0543  NA 136 138  K 4.4 4.1  CL 99 102  CO2 27 25  GLUCOSE 174* 178*  BUN 13 7  CREATININE 0.86 0.77  CALCIUM 9.1 8.8*  MG  --  1.9  PHOS  --  3.1   Liver Function Tests: Recent Labs  Lab 09/15/18 1202 09/16/18 0543  AST 26 16  ALT 36 26  ALKPHOS 46 43  BILITOT 1.1 0.8  PROT 7.8 7.1  ALBUMIN 4.5 3.9   Recent  Labs  Lab 09/15/18 1202 09/16/18 0543  LIPASE 77* 49   No results for input(s): AMMONIA in the last 168 hours. CBC: Recent Labs  Lab 09/15/18 1202 09/16/18 0543  WBC 12.2* 11.6*  HGB 15.0 13.9  HCT 45.8 42.5  MCV 99.8 101.2*  PLT 237 215   Cardiac Enzymes: No results for input(s): CKTOTAL, CKMB, CKMBINDEX, TROPONINI in the last 168 hours. BNP: Invalid input(s): POCBNP CBG: Recent Labs  Lab 09/15/18 2212 09/16/18 0734  GLUCAP 150* 142*   D-Dimer No results for input(s): DDIMER in the last 72 hours. Hgb A1c No results for input(s): HGBA1C in the last 72 hours. Lipid Profile Recent Labs    09/15/18 1202  CHOL 127  HDL 37*  LDLCALC 66  TRIG 122  CHOLHDL 3.4   Thyroid function studies Recent Labs    09/16/18 0543  TSH 0.639   Anemia work up No results for input(s): VITAMINB12, FOLATE, FERRITIN, TIBC, IRON, RETICCTPCT in the last 72 hours. Urinalysis    Component Value Date/Time   COLORURINE YELLOW 09/15/2018 [place] 09/15/2018 1234   LABSPEC 1.021 09/15/2018 1234   PHURINE 6.0 09/15/2018 1234   GLUCOSEU NEGATIVE 09/15/2018 1234   HGBUR NEGATIVE 09/15/2018 [place] 09/15/2018 1234   BILIRUBINUR negative 08/11/2013 1605   BILIRUBINUR neg 06/24/2013 1208   KETONESUR 5 (A) 09/15/2018 1234    PROTEINUR NEGATIVE 09/15/2018 1234   UROBILINOGEN 1.0 07/02/2014 1415   NITRITE NEGATIVE 09/15/2018 1234   LEUKOCYTESUR NEGATIVE 09/15/2018 1234   Sepsis Labs Invalid input(s): PROCALCITONIN,  WBC,  LACTICIDVEN Microbiology No results found for this or any previous visit (from the past 240 hour(s)).   Time coordinating discharge:  34  minutes  SIGNED:   Georgette Shell, MD  [facility] 09/16/2018, 12:00 PM Pager   If 7PM-7AM, please contact night-coverage [email or web address] Password [email or web address]

## 2018-09-17 LAB — HEMOGLOBIN A1C
Hgb A1c MFr Bld: 9 % — ABNORMAL HIGH (ref 4.8–5.6)
Mean Plasma Glucose: 212 mg/dL

## 2018-09-17 LAB — HIV ANTIBODY (ROUTINE TESTING W REFLEX): HIV Screen 4th Generation wRfx: NONREACTIVE

## 2018-09-22 DIAGNOSIS — E119 Type 2 diabetes mellitus without complications: Secondary | ICD-10-CM | POA: Diagnosis not present

## 2018-09-22 DIAGNOSIS — Z09 Encounter for follow-up examination after completed treatment for conditions other than malignant neoplasm: Secondary | ICD-10-CM | POA: Diagnosis not present

## 2018-09-22 DIAGNOSIS — Z125 Encounter for screening for malignant neoplasm of prostate: Secondary | ICD-10-CM | POA: Diagnosis not present

## 2018-09-22 DIAGNOSIS — R197 Diarrhea, unspecified: Secondary | ICD-10-CM | POA: Diagnosis not present

## 2018-09-22 DIAGNOSIS — K859 Acute pancreatitis without necrosis or infection, unspecified: Secondary | ICD-10-CM | POA: Diagnosis not present

## 2018-09-22 DIAGNOSIS — H524 Presbyopia: Secondary | ICD-10-CM | POA: Diagnosis not present

## 2018-10-05 DIAGNOSIS — R1013 Epigastric pain: Secondary | ICD-10-CM | POA: Diagnosis not present

## 2018-10-05 DIAGNOSIS — R197 Diarrhea, unspecified: Secondary | ICD-10-CM | POA: Diagnosis not present

## 2018-10-05 DIAGNOSIS — K859 Acute pancreatitis without necrosis or infection, unspecified: Secondary | ICD-10-CM | POA: Diagnosis not present

## 2018-10-09 ENCOUNTER — Other Ambulatory Visit: Payer: Self-pay | Admitting: Gastroenterology

## 2018-10-09 DIAGNOSIS — K859 Acute pancreatitis without necrosis or infection, unspecified: Secondary | ICD-10-CM

## 2018-10-09 DIAGNOSIS — R109 Unspecified abdominal pain: Secondary | ICD-10-CM

## 2018-10-14 ENCOUNTER — Ambulatory Visit
Admission: RE | Admit: 2018-10-14 | Discharge: 2018-10-14 | Disposition: A | Discharge status: Home / Self Care | Payer: BLUE CROSS/BLUE SHIELD | Source: Ambulatory Visit | Attending: Gastroenterology | Admitting: Gastroenterology

## 2018-10-14 DIAGNOSIS — R109 Unspecified abdominal pain: Secondary | ICD-10-CM

## 2018-10-14 DIAGNOSIS — K859 Acute pancreatitis without necrosis or infection, unspecified: Secondary | ICD-10-CM

## 2018-10-14 DIAGNOSIS — R1030 Lower abdominal pain, unspecified: Secondary | ICD-10-CM | POA: Diagnosis not present

## 2018-10-14 MED ORDER — IOPAMIDOL (ISOVUE-300) INJECTION 61%
125.0000 mL | Freq: Once | INTRAVENOUS | Status: AC | PRN
  Administered 2018-10-14: 125 mL via INTRAVENOUS

## 2018-10-21 DIAGNOSIS — Z1211 Encounter for screening for malignant neoplasm of colon: Secondary | ICD-10-CM | POA: Diagnosis not present

## 2018-10-21 DIAGNOSIS — K5730 Diverticulosis of large intestine without perforation or abscess without bleeding: Secondary | ICD-10-CM | POA: Diagnosis not present

## 2018-10-27 DIAGNOSIS — L718 Other rosacea: Secondary | ICD-10-CM | POA: Diagnosis not present

## 2018-10-27 DIAGNOSIS — L218 Other seborrheic dermatitis: Secondary | ICD-10-CM | POA: Diagnosis not present

## 2018-11-05 DIAGNOSIS — Z0000 Encounter for general adult medical examination without abnormal findings: Secondary | ICD-10-CM | POA: Diagnosis not present

## 2018-11-17 DIAGNOSIS — K760 Fatty (change of) liver, not elsewhere classified: Secondary | ICD-10-CM | POA: Diagnosis not present

## 2018-11-17 DIAGNOSIS — K859 Acute pancreatitis without necrosis or infection, unspecified: Secondary | ICD-10-CM | POA: Diagnosis not present

## 2018-12-07 DIAGNOSIS — G4733 Obstructive sleep apnea (adult) (pediatric): Secondary | ICD-10-CM | POA: Diagnosis not present

## 2018-12-09 DIAGNOSIS — G4733 Obstructive sleep apnea (adult) (pediatric): Secondary | ICD-10-CM | POA: Diagnosis not present

## 2019-01-07 DIAGNOSIS — G4733 Obstructive sleep apnea (adult) (pediatric): Secondary | ICD-10-CM | POA: Diagnosis not present

## 2019-02-06 DIAGNOSIS — G4733 Obstructive sleep apnea (adult) (pediatric): Secondary | ICD-10-CM | POA: Diagnosis not present

## 2019-03-09 DIAGNOSIS — G4733 Obstructive sleep apnea (adult) (pediatric): Secondary | ICD-10-CM | POA: Diagnosis not present

## 2019-04-08 DIAGNOSIS — G4733 Obstructive sleep apnea (adult) (pediatric): Secondary | ICD-10-CM | POA: Diagnosis not present

## 2019-05-09 DIAGNOSIS — G4733 Obstructive sleep apnea (adult) (pediatric): Secondary | ICD-10-CM | POA: Diagnosis not present

## 2019-05-17 DIAGNOSIS — E119 Type 2 diabetes mellitus without complications: Secondary | ICD-10-CM | POA: Diagnosis not present

## 2019-05-17 DIAGNOSIS — E785 Hyperlipidemia, unspecified: Secondary | ICD-10-CM | POA: Diagnosis not present

## 2019-06-09 DIAGNOSIS — G4733 Obstructive sleep apnea (adult) (pediatric): Secondary | ICD-10-CM | POA: Diagnosis not present

## 2019-06-14 DIAGNOSIS — K760 Fatty (change of) liver, not elsewhere classified: Secondary | ICD-10-CM | POA: Diagnosis not present

## 2019-06-14 DIAGNOSIS — R197 Diarrhea, unspecified: Secondary | ICD-10-CM | POA: Diagnosis not present

## 2019-06-14 DIAGNOSIS — K859 Acute pancreatitis without necrosis or infection, unspecified: Secondary | ICD-10-CM | POA: Diagnosis not present

## 2019-06-14 DIAGNOSIS — M25511 Pain in right shoulder: Secondary | ICD-10-CM | POA: Diagnosis not present

## 2019-07-05 DIAGNOSIS — M25511 Pain in right shoulder: Secondary | ICD-10-CM | POA: Diagnosis not present

## 2019-07-18 DIAGNOSIS — H8111 Benign paroxysmal vertigo, right ear: Secondary | ICD-10-CM | POA: Diagnosis not present

## 2019-07-18 DIAGNOSIS — H6981 Other specified disorders of Eustachian tube, right ear: Secondary | ICD-10-CM | POA: Diagnosis not present

## 2019-07-27 ENCOUNTER — Encounter: Payer: Self-pay | Admitting: Physical Therapy

## 2019-07-27 ENCOUNTER — Other Ambulatory Visit: Payer: Self-pay

## 2019-07-27 ENCOUNTER — Ambulatory Visit: Payer: BC Managed Care – PPO | Attending: Family Medicine | Admitting: Physical Therapy

## 2019-07-27 DIAGNOSIS — R2681 Unsteadiness on feet: Secondary | ICD-10-CM | POA: Diagnosis not present

## 2019-07-27 DIAGNOSIS — R42 Dizziness and giddiness: Secondary | ICD-10-CM | POA: Diagnosis not present

## 2019-07-28 ENCOUNTER — Encounter: Payer: Self-pay | Admitting: Physical Therapy

## 2019-07-28 DIAGNOSIS — H8111 Benign paroxysmal vertigo, right ear: Secondary | ICD-10-CM | POA: Diagnosis not present

## 2019-07-28 NOTE — Patient Instructions (Signed)
Pt instructed in balance on foam - feet together - EO and EC with head turns

## 2019-07-28 NOTE — Therapy (Signed)
[facility] [address] [place], [place], [address] Phone: [phone]   Fax:  [phone]  Physical Therapy Evaluation  Patient Details  Name: KEYTH KUPEC MRN: [id number] Date of Birth: 1966/01/04 Referring Provider (PT): London Pepper, MD   Encounter Date: 07/27/2019  PT End of Session - 07/28/19 2121    Visit Number  1    Number of Visits  1    Authorization Type  BCBS    PT Start Time  [id number]    PT Stop Time  1452    PT Time Calculation (min)  47 min    Activity Tolerance  Patient tolerated treatment well    Behavior During Therapy  [facility] for tasks assessed/performed       Past Medical History:  Diagnosis Date  . AV block   . Cervical radicular pain   . Diverticulitis of colon   . Gastritis   . HTN (hypertension)   . Hyperlipidemia   . Morbid obesity ([place])   . Normal coronary angiogram 2004  . Palpitations   . Radiculitis of leg   . Sleep apnea   . Thoracic spine pain     Past Surgical History:  Procedure Laterality Date  . CARDIAC CATHETERIZATION  11/17/2002   NORMAL. EF 65%    There were no vitals filed for this visit.   Subjective Assessment - 07/27/19 1415    Subjective  Vertigo episode started on Sat., 07-17-19 after getting off a boat - owns [employer] in [place]; went to Urgent Care on 07-18-19; was prescribed Meclizine but is no longer taking this medication; pt states vertigo is now much improved since time of onset    Pertinent History  hx of migraines    Patient Stated Goals  resolve the vertigo    Currently in Pain?  No/denies         [facility] PT Assessment - 07/28/19 0001      Assessment   Medical Diagnosis  Vertigo    Referring Provider (PT)  London Pepper, MD    Onset Date/Surgical Date  07/17/19      Precautions   Precautions  None      Restrictions   Weight Bearing Restrictions  No      Balance Screen   Has the patient fallen in the past 6 months   No    Has the patient had a decrease in activity level because of a fear of falling?   No    Is the patient reluctant to leave their home because of a fear of falling?   No      Prior Function   Level of Independence  Independent      Ambulation/Gait   Ambulation/Gait  Yes    Ambulation/Gait Assistance  7: Independent    Ambulation Distance (Feet)  150 Feet    Assistive device  None    Gait Pattern  Within Functional Limits           Vestibular Assessment - 07/28/19 0001      Symptom Behavior   Subjective history of current problem  pt states vertiginous episode started on 07-17-19; has now mostly resolved     Type of Dizziness   Spinning;Comment   no longer spinning as vertigo has improved   Frequency of Dizziness  depends on movement    Duration of Dizziness  seconds    Symptom Nature  Positional    Aggravating Factors  Turning body quickly;Turning  head quickly    Relieving Factors  Slow movements    Progression of Symptoms  Better      Oculomotor Exam   Oculomotor Alignment  Normal    Spontaneous  Absent      Visual Acuity   Static  line 8    Dynamic  line 6   WNL's     Positional Testing   Dix-Hallpike  Dix-Hallpike Right;Dix-Hallpike Left    Sidelying Test  Sidelying Right;Sidelying Left      Dix-Hallpike Right   Dix-Hallpike Right Duration  none    Dix-Hallpike Right Symptoms  No nystagmus      Dix-Hallpike Left   Dix-Hallpike Left Duration  none    Dix-Hallpike Left Symptoms  No nystagmus      Sidelying Right   Sidelying Right Duration  none      Sidelying Left   Sidelying Left Duration  none          Objective measurements completed on examination: See above findings.              PT Education - 07/28/19 2119    Education Details  balance on foam - EO and EC - feet together; also instructed to do quick step/turn for habituation    Person(s) Educated  Patient    Methods  Explanation;Demonstration;Handout    Comprehension   Verbalized understanding;Returned demonstration                  Plan - 07/28/19 2121    Clinical Impression Statement  Pt is a 53 yr old gentleman with symptoms of BPPV which appears to have now resolved; no nystagmus was noted with any positional testing.  DVA WNL's with a 2 line difference, indicative of normal VOR and gaze stabilization.  Pt does exhibit some mild imbalance with quick turns and body movements - pt was instructed in balance on foam exercises to increase vestibular input in maintaining balance and in habituation exercise.    Personal Factors and Comorbidities  Comorbidity 1    Examination-Activity Limitations  Locomotion Level    Examination-Participation Restrictions  Community Activity;Yard Work    Stability/Clinical Decision Making  Stable/Uncomplicated    Clinical Decision Making  Low    Rehab Potential  Good    PT Frequency  One time visit    PT Treatment/Interventions  Vestibular;Patient/family education;ADLs/Self Care Home Management    PT Next Visit Plan  N/A - eval only    PT Home Exercise Plan  balance on foam; habituation of turning    Consulted and Agree with Plan of Care  Patient       Patient will benefit from skilled therapeutic intervention in order to improve the following deficits and impairments:  Dizziness, Decreased balance  Visit Diagnosis: Dizziness and giddiness - Plan: PT plan of care cert/re-cert  Unsteadiness on feet - Plan: PT plan of care cert/re-cert     Problem List Patient Active Problem List   Diagnosis Date Noted  . Acute pancreatitis 09/16/2018  . Pancreatitis 09/15/2018  . Hepatic steatosis 09/15/2018  . Obesity 09/15/2018  . GERD (gastroesophageal reflux disease) 09/15/2018  . CAP (community acquired pneumonia) 07/02/2014  . Sepsis ([place]) 07/02/2014  . DM (diabetes mellitus) ([facility]) 08/26/2013  . Numbness 08/26/2013  . Cardiovascular risk factor 04/04/2011  . HTN (hypertension) 03/08/2011  . Heart palpitations  03/08/2011  . Hyperlipidemia 03/08/2011  . OBSTRUCTIVE SLEEP APNEA 05/19/2009  . 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 NASAL POLYP 05/19/2009  . MIGRAINES, HX  OF 05/19/2009    Alda Lea, PT 07/28/2019, 9:30 PM  [facility] [address] [place], [place], [address] Phone: [phone]   Fax:  [phone]  Name: KALLAN COATNEY MRN: [id number] Date of Birth: 1966/01/02

## 2019-07-29 ENCOUNTER — Ambulatory Visit: Payer: BLUE CROSS/BLUE SHIELD | Admitting: Physical Therapy

## 2019-08-03 DIAGNOSIS — H6123 Impacted cerumen, bilateral: Secondary | ICD-10-CM | POA: Diagnosis not present

## 2019-08-03 DIAGNOSIS — Z8739 Personal history of other diseases of the musculoskeletal system and connective tissue: Secondary | ICD-10-CM | POA: Diagnosis not present

## 2019-08-03 DIAGNOSIS — R42 Dizziness and giddiness: Secondary | ICD-10-CM | POA: Diagnosis not present

## 2019-08-05 DIAGNOSIS — R0602 Shortness of breath: Secondary | ICD-10-CM | POA: Diagnosis not present

## 2019-08-05 DIAGNOSIS — R42 Dizziness and giddiness: Secondary | ICD-10-CM | POA: Diagnosis not present

## 2019-08-05 DIAGNOSIS — H938X1 Other specified disorders of right ear: Secondary | ICD-10-CM | POA: Diagnosis not present

## 2019-08-05 DIAGNOSIS — R519 Headache, unspecified: Secondary | ICD-10-CM | POA: Diagnosis not present

## 2019-08-06 DIAGNOSIS — Z20828 Contact with and (suspected) exposure to other viral communicable diseases: Secondary | ICD-10-CM | POA: Diagnosis not present

## 2019-08-12 ENCOUNTER — Other Ambulatory Visit: Payer: Self-pay | Admitting: Otolaryngology

## 2019-08-12 DIAGNOSIS — R0602 Shortness of breath: Secondary | ICD-10-CM

## 2019-08-12 DIAGNOSIS — E119 Type 2 diabetes mellitus without complications: Secondary | ICD-10-CM | POA: Diagnosis not present

## 2019-08-12 DIAGNOSIS — R519 Headache, unspecified: Secondary | ICD-10-CM

## 2019-08-12 DIAGNOSIS — J019 Acute sinusitis, unspecified: Secondary | ICD-10-CM | POA: Diagnosis not present

## 2019-08-12 DIAGNOSIS — H938X1 Other specified disorders of right ear: Secondary | ICD-10-CM

## 2019-08-12 DIAGNOSIS — R42 Dizziness and giddiness: Secondary | ICD-10-CM | POA: Diagnosis not present

## 2019-08-15 ENCOUNTER — Emergency Department (INDEPENDENT_AMBULATORY_CARE_PROVIDER_SITE_OTHER)
Admission: EM | Admit: 2019-08-15 | Discharge: 2019-08-15 | Disposition: A | Discharge status: Home / Self Care | Payer: BC Managed Care – PPO | Source: Home / Self Care

## 2019-08-15 ENCOUNTER — Other Ambulatory Visit: Payer: Self-pay

## 2019-08-15 DIAGNOSIS — R42 Dizziness and giddiness: Secondary | ICD-10-CM | POA: Diagnosis not present

## 2019-08-15 MED ORDER — IPRATROPIUM BROMIDE 0.06 % NA SOLN: 2.0000 | Freq: Four times a day (QID) | NASAL | 1 refills | Status: DC

## 2019-08-15 MED ORDER — CETIRIZINE HCL 10 MG PO TABS: 10.0000 mg | ORAL_TABLET | Freq: Every day | ORAL | 0 refills | Status: DC

## 2019-08-15 NOTE — ED Provider Notes (Signed)
[name]    CSN: [id number] Arrival date & time: 08/15/19  1409      History   Chief Complaint Chief Complaint  Patient presents with  . Dizziness    Lightheaded  . Chest Pain    tightness    HPI Robert Schultz is a 53 y.o. male.   HPI  Robert Schultz is a 53 y.o. male presenting to UC with c/o 1 month of intermittent dizziness and chest tightness.  He has been seen at a different urgent care at the start of symptoms, was prescribed prednisone but no relief. He was seen by ENT who prescribed meclizine but no relief. He was evaluated 2 weeks ago by EMS after a co-worker called 911 for his episode of dizziness. Pt denies syncope.  He had a normal EKG with EMS so he did not go to the hospital.  He has most recently done a virtual visit with his PCP who started him on a course of azithromycin to cover for potential sinus infection. He has 2 doses left but no relief of symptoms.  He has f/u with ENT and MRI scheduled December 6th but he is concerned about waiting that long.  Denies recent worsening of symptoms. Denies HA or chest pain at this time but states when he becomes dizzy, his chest tightens and he feels a "throbbing" in his head but denies severe pain. No change in vision. No n/v/d.    Past Medical History:  Diagnosis Date  . AV block   . Cervical radicular pain   . Diverticulitis of colon   . Gastritis   . HTN (hypertension)   . Hyperlipidemia   . Morbid obesity ([place])   . Normal coronary angiogram 2004  . Palpitations   . Radiculitis of leg   . Sleep apnea   . Thoracic spine pain     Patient Active Problem List   Diagnosis Date Noted  . Acute pancreatitis 09/16/2018  . Pancreatitis 09/15/2018  . Hepatic steatosis 09/15/2018  . Obesity 09/15/2018  . GERD (gastroesophageal reflux disease) 09/15/2018  . CAP (community acquired pneumonia) 07/02/2014  . Sepsis ([place]) 07/02/2014  . DM (diabetes mellitus) ([place]) 08/26/2013  . Numbness 08/26/2013  .  Cardiovascular risk factor 04/04/2011  . HTN (hypertension) 03/08/2011  . Heart palpitations 03/08/2011  . Hyperlipidemia 03/08/2011  . OBSTRUCTIVE SLEEP APNEA 05/19/2009  . 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 NASAL POLYP 05/19/2009  . MIGRAINES, HX OF 05/19/2009    Past Surgical History:  Procedure Laterality Date  . CARDIAC CATHETERIZATION  11/17/2002   NORMAL. EF 65%       Home Medications    Prior to Admission medications   Medication Sig Start Date End Date Taking? Authorizing Provider  atorvastatin (LIPITOR) 10 MG tablet Take 10 mg by mouth daily.    [provider]  cetirizine (ZYRTEC) 10 MG tablet Take 1 tablet (10 mg total) by mouth daily. 08/15/19   Noe Gens, PA-C  ipratropium (ATROVENT) 0.06 % nasal spray Place 2 sprays into both nostrils 4 (four) times daily. 08/15/19   Noe Gens, PA-C  metFORMIN (GLUCOPHAGE-XR) 500 MG 24 hr tablet Take 500 mg by mouth every evening.  12/12/16   [provider]  minocycline (MINOCIN) 100 MG capsule Take 100 mg by mouth 2 (two) times daily. 08/10/19   [provider]  omeprazole (PRILOSEC) 20 MG capsule Take 1 capsule (20 mg total) by mouth daily. 12/16/16   Varney Biles, MD  Family History Family History  Problem Relation Age of Onset  . Multiple sclerosis Mother   . Hypertension Father   . Cancer Father     Social History Social History   Tobacco Use  . Smoking status: Never Smoker  . Smokeless tobacco: Never Used  Substance Use Topics  . Alcohol use: No  . Drug use: No     Allergies   Hydrocodone, Penicillins, Codeine, and Hydrocodone-acetaminophen   Review of Systems Review of Systems  Constitutional: Negative for chills and fever.  HENT: Negative for congestion, ear pain, sore throat, trouble swallowing and voice change.   Eyes: Negative for pain and visual disturbance.  Respiratory: Positive for chest tightness (mild intermittent). Negative for cough and shortness of breath.    Cardiovascular: Negative for chest pain and palpitations.  Gastrointestinal: Negative for abdominal pain, diarrhea, nausea and vomiting.  Musculoskeletal: Negative for arthralgias, back pain and myalgias.  Skin: Negative for rash.  Neurological: Positive for dizziness and headaches (mild intermittent). Negative for light-headedness.     Physical Exam Triage Vital Signs ED Triage Vitals [08/15/19 1428]  Enc Vitals Group     BP (!) 156/97     Pulse Rate 81     Resp 18     Temp 98.7 F (37.1 C)     Temp Source Oral     SpO2 100 %     Weight      Height      Head Circumference      Peak Flow      Pain Score 0     Pain Loc      Pain Edu?      Excl. in [place]?    Orthostatic VS for the past 24 hrs:  BP- Lying Pulse- Lying BP- Sitting Pulse- Sitting BP- Standing at 0 minutes Pulse- Standing at 0 minutes  08/15/19 1503 (!) 146/95 79 (!) 150/100 88 (!) 143/99 89    Updated Vital Signs BP (!) 156/97 (BP Location: Right Arm)   Pulse 81   Temp 98.7 F (37.1 C) (Oral)   Resp 18   SpO2 100%   Visual Acuity Right Eye Distance:   Left Eye Distance:   Bilateral Distance:    Right Eye Near:   Left Eye Near:    Bilateral Near:     Physical Exam Vitals signs and nursing note reviewed.  Constitutional:      General: He is not in acute distress.    Appearance: He is well-developed. He is not ill-appearing, toxic-appearing or diaphoretic.  HENT:     Head: Normocephalic and atraumatic.     Right Ear: Tympanic membrane and ear canal normal.     Left Ear: Tympanic membrane and ear canal normal.     Nose: Nose normal.     Right Sinus: No maxillary sinus tenderness or frontal sinus tenderness.     Left Sinus: No maxillary sinus tenderness or frontal sinus tenderness.     Mouth/Throat:     Lips: Pink.     Mouth: Mucous membranes are moist.     Pharynx: Oropharynx is clear. Uvula midline.  Eyes:     Extraocular Movements: Extraocular movements intact.     Pupils: Pupils are equal,  round, and reactive to light.  Neck:     Musculoskeletal: Normal range of motion and neck supple.  Cardiovascular:     Rate and Rhythm: Normal rate and regular rhythm.  Pulmonary:     Effort: Pulmonary effort is normal.  Breath sounds: No decreased breath sounds, wheezing, rhonchi or rales.  Musculoskeletal: Normal range of motion.  Skin:    General: Skin is warm and dry.     Capillary Refill: Capillary refill takes less than 2 seconds.  Neurological:     General: No focal deficit present.     Mental Status: He is alert and oriented to person, place, and time.     Cranial Nerves: No cranial nerve deficit.     Motor: No weakness.  Psychiatric:        Mood and Affect: Mood normal.        Behavior: Behavior normal.      UC Treatments / Results  Labs (all labs ordered are listed, but only abnormal results are displayed) Labs Reviewed - No data to display  EKG   Radiology No results found.  Procedures Procedures (including critical care time)  Medications Ordered in UC Medications - No data to display  Initial Impression / Assessment and Plan / UC Course  I have reviewed the triage vital signs and the nursing notes.  Pertinent labs & imaging results that were available during my care of the patient were reviewed by me and considered in my medical decision making (see chart for details).     Normal neuro exam Orthostatic vitals: unremarkable Pt denies chest pain or SOB at this time. Pt reports normal EKG 2 weeks ago with EMS. No indication for repeat EKG today Reassuring no worsening of symptoms over the last 1 month. Encouraged to f/u as scheduled for December 6th MRI but he may want to call ENT tomorrow to see if he can move it closer.  Discussed symptoms that warrant emergent care in the ED. AVS provided  Final Clinical Impressions(s) / UC Diagnoses   Final diagnoses:  Dizziness  Lightheadedness     Discharge Instructions      You may try the nasal  spray and generic zyrtec to see if these medications help with your symptoms.  Please call your ENT or family doctor tomorrow to discuss persistent symptoms.  Call 911 or have someone drive you to the hospital if symptoms worsening- severe headache, passing out, trouble breathing, or other new concerning symptoms develop.    ED Prescriptions    Medication Sig Dispense Auth. Provider   ipratropium (ATROVENT) 0.06 % nasal spray Place 2 sprays into both nostrils 4 (four) times daily. 15 mL Gerarda Fraction, [place] Humbarger O, PA-C   cetirizine (ZYRTEC) 10 MG tablet Take 1 tablet (10 mg total) by mouth daily. 30 tablet Noe Gens, [place]     PDMP not reviewed this encounter.   Noe Gens, [place] 08/16/19 [phone]

## 2019-08-15 NOTE — ED Triage Notes (Signed)
Pt here today for intermittent dizziness x 90mos. Has seen ENT and has MRI scheduled for Dec 6th. Was evaluated 2 weeks ago by EMTs when his coworkers called for a dizzy episode. Says was rx'd meclizine but it hasnt worked.

## 2019-08-15 NOTE — Discharge Instructions (Signed)
°  You may try the nasal spray and generic zyrtec to see if these medications help with your symptoms.  Please call your ENT or family doctor tomorrow to discuss persistent symptoms.  Call 911 or have someone drive you to the hospital if symptoms worsening- severe headache, passing out, trouble breathing, or other new concerning symptoms develop.

## 2019-08-17 DIAGNOSIS — H748X2 Other specified disorders of left middle ear and mastoid: Secondary | ICD-10-CM | POA: Diagnosis not present

## 2019-08-17 DIAGNOSIS — R42 Dizziness and giddiness: Secondary | ICD-10-CM | POA: Diagnosis not present

## 2019-08-18 DIAGNOSIS — R42 Dizziness and giddiness: Secondary | ICD-10-CM | POA: Diagnosis not present

## 2019-08-18 DIAGNOSIS — E119 Type 2 diabetes mellitus without complications: Secondary | ICD-10-CM | POA: Diagnosis not present

## 2019-08-26 DIAGNOSIS — G4733 Obstructive sleep apnea (adult) (pediatric): Secondary | ICD-10-CM | POA: Diagnosis not present

## 2019-08-29 ENCOUNTER — Other Ambulatory Visit: Payer: BC Managed Care – PPO

## 2019-08-30 DIAGNOSIS — R42 Dizziness and giddiness: Secondary | ICD-10-CM | POA: Diagnosis not present

## 2019-09-07 ENCOUNTER — Encounter: Payer: Self-pay | Admitting: Cardiovascular Disease

## 2019-09-07 ENCOUNTER — Ambulatory Visit (INDEPENDENT_AMBULATORY_CARE_PROVIDER_SITE_OTHER): Payer: BC Managed Care – PPO | Admitting: Cardiovascular Disease

## 2019-09-07 ENCOUNTER — Other Ambulatory Visit: Payer: Self-pay

## 2019-09-07 DIAGNOSIS — I10 Essential (primary) hypertension: Secondary | ICD-10-CM

## 2019-09-07 DIAGNOSIS — R072 Precordial pain: Secondary | ICD-10-CM | POA: Diagnosis not present

## 2019-09-07 DIAGNOSIS — R0789 Other chest pain: Secondary | ICD-10-CM

## 2019-09-07 DIAGNOSIS — Z8249 Family history of ischemic heart disease and other diseases of the circulatory system: Secondary | ICD-10-CM | POA: Insufficient documentation

## 2019-09-07 DIAGNOSIS — R002 Palpitations: Secondary | ICD-10-CM

## 2019-09-07 DIAGNOSIS — G4733 Obstructive sleep apnea (adult) (pediatric): Secondary | ICD-10-CM

## 2019-09-07 DIAGNOSIS — E782 Mixed hyperlipidemia: Secondary | ICD-10-CM

## 2019-09-07 NOTE — Assessment & Plan Note (Signed)
History of hyperlipidemia intolerant to statin therapy with lipid profile performed 05/17/2019 revealing total cholesterol 211, LDL of 130 and HDL 36.  He does admit to dietary discretion.

## 2019-09-07 NOTE — Assessment & Plan Note (Signed)
History of essential potential blood pressure measured today 118/70.  He is not on antihypertensive medications.

## 2019-09-07 NOTE — Assessment & Plan Note (Signed)
Father had stents in his 35s

## 2019-09-07 NOTE — Assessment & Plan Note (Signed)
History of obstructive sleep apnea on CPAP. 

## 2019-09-07 NOTE — Progress Notes (Signed)
09/07/2019 Robert Schultz   Aug 07, 1966  [id number]  Primary Physician London Pepper, MD Primary Cardiologist: Lorretta Harp MD [name], [place]  HPI:  Robert Schultz is a 53 y.o. moderately overweight married Caucasian male father of 3 children referred by Dr. Orland Mustard for cardiovascular valuation because of occasional dizziness and atypical chest pain.  He formally saw Dr. Doreatha Lew as a cardiologist years ago.  He did apparently have a Holter monitor that showed PVCs and an abnormal cardiac cath.  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  His risk factors include type 2 diabetes on Metformin, mild hyperlipidemia intolerant to statin therapy and family history of heart disease with a father who had stents in his 5s.  He has been taking care of his parents his caregivers for the last 63 half years and unfortunately his father passed away 04-Aug-2023 of this year at age 6 of cancer.  He is dealing with this currently.  He was put on Jardiance by his PCP and noticed the dizziness at that time which is since subsided after Jardiance was discontinued.  He has had infrequent episodes of atypical chest pain.  He does admit to dietary indiscretion.  The only exercise he gets is occasionally walking his dog.   No outpatient medications have been marked as taking for the 09/07/19 encounter (Office Visit) with Lorretta Harp, MD.     Allergies  Allergen Reactions  . Hydrocodone Other (See Comments)    "I break into a violent sweat--fast."  . Penicillins     Profuse sweating Swelling of the face/tongue/throat, SOB, or low BP? N Sudden or severe rash/hives, skin peeling, or the inside of the mouth or nose? n Did it require medical treatment? N When did it last happen?unk If all above answers are "NO", may proceed with cephalosporin use.   . Codeine   . Hydrocodone-Acetaminophen  Swelling    Social History   Socioeconomic History  . Marital status: Married    Spouse name: Not on file  . Number of children: Not on file  . Years of education: Not on file  . Highest education level: Not on file  Occupational History  . Not on file  Tobacco Use  . Smoking status: Never Smoker  . Smokeless tobacco: Never Used  Substance and Sexual Activity  . Alcohol use: No  . Drug use: No  . Sexual activity: Yes  Other Topics Concern  . Not on file  Social History Narrative  . Not on file   Social Determinants of Health   Financial Resource Strain:   . Difficulty of Paying Living Expenses: Not on file  Food Insecurity:   . Worried About Charity fundraiser in the Last Year: Not on file  . Ran Out of Food in the Last Year: Not on file  Transportation Needs:   . Lack of Transportation (Medical): Not on file  . Lack of Transportation (Non-Medical): Not on file  Physical Activity:   . Days of Exercise per Week: Not on file  . Minutes of Exercise per Session: Not on file  Stress:   . Feeling of Stress : Not on file  Social Connections:   . Frequency of Communication with Friends and Family: Not on file  . Frequency of Social Gatherings with Friends and Family: Not on file  . Attends Religious Services:  Not on file  . Active Member of Clubs or Organizations: Not on file  . Attends Archivist Meetings: Not on file  . Marital Status: Not on file  Intimate Partner Violence:   . Fear of Current or Ex-Partner: Not on file  . Emotionally Abused: Not on file  . Physically Abused: Not on file  . Sexually Abused: Not on file     Review of Systems: General: negative for chills, fever, night sweats or weight changes.  Cardiovascular: negative for chest pain, dyspnea on exertion, edema, orthopnea, palpitations, paroxysmal nocturnal dyspnea or shortness of breath Dermatological: negative for rash Respiratory: negative for cough or wheezing Urologic: negative for  hematuria Abdominal: negative for nausea, vomiting, diarrhea, bright red blood per rectum, melena, or hematemesis Neurologic: negative for visual changes, syncope, or dizziness All other systems reviewed and are otherwise negative except as noted above.    Blood pressure 118/70, pulse 79, temperature (!) 95.2 F (35.1 C), height 5\' 8"  (1.727 m), weight 238 lb (108 kg).  General appearance: alert and no distress Neck: no adenopathy, no carotid bruit, no JVD, supple, symmetrical, trachea midline and thyroid not enlarged, symmetric, no tenderness/mass/nodules Lungs: clear to auscultation bilaterally Heart: regular rate and rhythm, S1, S2 normal, no murmur, click, rub or gallop Extremities: extremities normal, atraumatic, no cyanosis or edema Pulses: 2+ and symmetric Skin: Skin color, texture, turgor normal. No rashes or lesions Neurologic: Alert and oriented X 3, normal strength and tone. Normal symmetric reflexes. Normal coordination and gait  EKG sinus rhythm at 79 without ST or T wave changes.  I personally reviewed this EKG.  ASSESSMENT AND PLAN:   Atypical chest pain History of atypical chest pain with a clean cath by Dr. Doreatha Lew years ago.  Gets occasional atypical chest pain which he relates to stress.  His risk factors include family history and mild hyperlipidemia.  I am going to get a pharmacologic Myoview stress test and a coronary calcium score to further evaluate  HTN (hypertension) History of essential potential blood pressure measured today 118/70.  He is not on antihypertensive medications.  Heart palpitations History of palpitations characterized as being PVCs by prior Holter monitoring.  He gets this these occasionally, usually at times of stress  Hyperlipidemia History of hyperlipidemia intolerant to statin therapy with lipid profile performed 05/17/2019 revealing total cholesterol 211, LDL of 130 and HDL 36.  He does admit to dietary discretion.  OBSTRUCTIVE SLEEP  APNEA History of obstructive sleep apnea on CPAP  Family history of heart disease Father had stents in his 71s      Lorretta Harp MD [facility], [facility] 09/07/2019 9:11 AM

## 2019-09-07 NOTE — Assessment & Plan Note (Signed)
History of palpitations characterized as being PVCs by prior Holter monitoring.  He gets this these occasionally, usually at times of stress

## 2019-09-07 NOTE — Assessment & Plan Note (Signed)
History of atypical chest pain with a clean cath by Dr. Doreatha Lew years ago.  Gets occasional atypical chest pain which he relates to stress.  His risk factors include family history and mild hyperlipidemia.  I am going to get a pharmacologic Myoview stress test and a coronary calcium score to further evaluate

## 2019-09-07 NOTE — Patient Instructions (Addendum)
Medication Instructions:  Your physician recommends that you continue on your current medications as directed. Please refer to the Current Medication list given to you today.  If you need a refill on your cardiac medications before your next appointment, please call your pharmacy.   Lab work: NONE  Testing/Procedures: Your physician has requested that you have a lexiscan myoview. For further information please visit [email or web address]. Please follow instruction sheet, as given.  AND  Coronary Calcium Score  Follow-Up: At [facility], you and your health needs are our priority.  As part of our continuing mission to provide you with exceptional heart care, we have created designated Provider Care Teams.  These Care Teams include your primary Cardiologist (physician) and Advanced Practice Providers (APPs -  Physician Assistants and Nurse Practitioners) who all work together to provide you with the care you need, when you need it. You may see Dr Gwenlyn Found or one of the following Advanced Practice Providers on your designated Care Team:    Kerin Ransom, PA-C  [place], [place]  Coletta Memos, [place] Your physician wants you to follow-up in: 1 year    Any Other Special Instructions Will Be Listed Below (If Applicable). Please start eating a Heart Healthy Diet.   Coronary Calcium Scan A coronary calcium scan is an imaging test used to look for deposits of calcium and other fatty materials (plaques) in the inner lining of the blood vessels of the heart (coronary arteries). These deposits of calcium and plaques can partly clog and narrow the coronary arteries without producing any symptoms or warning signs. This puts a person at risk for a heart attack. This test can detect these deposits before symptoms develop. Tell a health care provider about:  Any allergies you have.  All medicines you are taking, including vitamins, herbs, eye drops, creams, and over-the-counter medicines.  Any  problems you or family members have had with anesthetic medicines.  Any blood disorders you have.  Any surgeries you have had.  Any medical conditions you have.  Whether you are pregnant or may be pregnant. What are the risks? Generally, this is a safe procedure. However, problems may occur, including:  Harm to a pregnant woman and her unborn baby. This test involves the use of radiation. Radiation exposure can be dangerous to a pregnant woman and her unborn baby. If you are pregnant, you generally should not have this procedure done.  Slight increase in the risk of cancer. This is because of the radiation involved in the test. What happens before the procedure? No preparation is needed for this procedure. What happens during the procedure?   You will undress and remove any jewelry around your neck or chest.  You will put on a hospital gown.  Sticky electrodes will be placed on your chest. The electrodes will be connected to an electrocardiogram (ECG) machine to record a tracing of the electrical activity of your heart.  A CT scanner will take pictures of your heart. During this time, you will be asked to lie still and hold your breath for 2-3 seconds while a picture of your heart is being taken. The procedure may vary among health care providers and hospitals. What happens after the procedure?  You can get dressed.  You can return to your normal activities.  It is up to you to get the results of your test. Ask your health care provider, or the department that is doing the test, when your results will be ready. Summary  A coronary calcium  scan is an imaging test used to look for deposits of calcium and other fatty materials (plaques) in the inner lining of the blood vessels of the heart (coronary arteries).  Generally, this is a safe procedure. Tell your health care provider if you are pregnant or may be pregnant.  No preparation is needed for this procedure.  A CT scanner  will take pictures of your heart.  You can return to your normal activities after the scan is done. This information is not intended to replace advice given to you by your health care provider. Make sure you discuss any questions you have with your health care provider. Document Released: 03/07/2008 Document Revised: 08/22/2017 Document Reviewed: 07/29/2016 Elsevier Patient Education  2020 [employer].

## 2019-09-08 ENCOUNTER — Telehealth (HOSPITAL_COMMUNITY): Payer: Self-pay

## 2019-09-08 ENCOUNTER — Other Ambulatory Visit: Payer: BC Managed Care – PPO

## 2019-09-08 NOTE — Telephone Encounter (Signed)
Encounter complete. 

## 2019-09-10 ENCOUNTER — Ambulatory Visit (HOSPITAL_COMMUNITY)
Admission: RE | Admit: 2019-09-10 | Discharge: 2019-09-10 | Disposition: A | Discharge status: Home / Self Care | Payer: BC Managed Care – PPO | Source: Ambulatory Visit | Attending: Cardiovascular Disease | Admitting: Cardiovascular Disease

## 2019-09-10 ENCOUNTER — Other Ambulatory Visit: Payer: Self-pay

## 2019-09-10 DIAGNOSIS — R072 Precordial pain: Secondary | ICD-10-CM | POA: Diagnosis not present

## 2019-09-10 LAB — MYOCARDIAL PERFUSION IMAGING
LV dias vol: 88 mL (ref 62–150)
LV sys vol: 36 mL
Peak HR: 114 {beats}/min
Rest HR: 78 {beats}/min
SDS: 2
SRS: 0
SSS: 2
TID: 1.19

## 2019-09-10 MED ORDER — TECHNETIUM TC 99M TETROFOSMIN IV KIT
10.7000 | PACK | Freq: Once | INTRAVENOUS | Status: AC | PRN
  Administered 2019-09-10: 10.7 via INTRAVENOUS
  Filled 2019-09-10: qty 11

## 2019-09-10 MED ORDER — REGADENOSON 0.4 MG/5ML IV SOLN
0.4000 mg | Freq: Once | INTRAVENOUS | Status: AC
  Administered 2019-09-10: 0.4 mg via INTRAVENOUS

## 2019-09-10 MED ORDER — TECHNETIUM TC 99M TETROFOSMIN IV KIT
28.0000 | PACK | Freq: Once | INTRAVENOUS | Status: AC | PRN
  Administered 2019-09-10: 28 via INTRAVENOUS
  Filled 2019-09-10: qty 28

## 2019-09-22 ENCOUNTER — Ambulatory Visit (INDEPENDENT_AMBULATORY_CARE_PROVIDER_SITE_OTHER)
Admission: RE | Admit: 2019-09-22 | Discharge: 2019-09-22 | Disposition: A | Discharge status: Home / Self Care | Payer: Self-pay | Source: Ambulatory Visit | Attending: Cardiovascular Disease | Admitting: Cardiovascular Disease

## 2019-09-22 ENCOUNTER — Other Ambulatory Visit: Payer: Self-pay

## 2019-09-22 DIAGNOSIS — R072 Precordial pain: Secondary | ICD-10-CM

## 2019-09-22 DIAGNOSIS — E782 Mixed hyperlipidemia: Secondary | ICD-10-CM

## 2019-10-21 ENCOUNTER — Encounter: Payer: Self-pay | Admitting: Neurology

## 2019-10-21 ENCOUNTER — Other Ambulatory Visit: Payer: Self-pay

## 2019-10-21 ENCOUNTER — Ambulatory Visit (INDEPENDENT_AMBULATORY_CARE_PROVIDER_SITE_OTHER): Payer: BC Managed Care – PPO | Admitting: Neurology

## 2019-10-21 VITALS — BP 144/98 | HR 78 | Temp 97.4°F | Ht 68.0 in | Wt 235.0 lb

## 2019-10-21 DIAGNOSIS — H8302 Labyrinthitis, left ear: Secondary | ICD-10-CM | POA: Diagnosis not present

## 2019-10-21 NOTE — Progress Notes (Signed)
PATIENT: Robert Schultz DOB: Apr 24, 1966  Chief Complaint  Patient presents with   New Patient (Initial Visit)    Rm 4 alone Last visit was in 2014- Pt reports in late oct he had an episode of dizziness- tested for vestibular neuritis - vestibular rehab was initiated. Pt sts the dizziness is still present but not as severe   PCP    London Pepper, MD    Referring MD    Izora Gala, MD      HISTORICAL  Robert Schultz is a 54 year old male, seen in request by his ENT physician Dr. Constance Holster and primary care physician Dr. London Pepper, for evaluation of sudden onset dizziness, initial evaluation was on October 21, 2019.  I have reviewed and summarized the referring note from the referring physician.  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 which appeared to primarily affected the saccular and all inferior branch of the vestibular nerve, mild sensory neuronal asymmetry with the left ear poorer than the right, it is likely a case of labyrinthitis, he was instructed to perform home VOR and standing balance exercise to increase the efficacy of the vestibular ocular reflexes in the vestibular spinal reflex,  MRI of temporal bone/internal acoustic canal with without contrast, trace right mastoid effusion, no significant temporal bone abnormality, dural venous sinus not well evaluated  Over the past few months, his symptoms has much improved, only mildly symptomatic during sudden positional change, he describe when he moves forward suddenly, he felt like his body kept on  going even though he has stopped.  He has no limitation in his daily activity.  REVIEW OF SYSTEMS: Full 14 system review of systems performed and notable only for as above All other review of systems were negative.  ALLERGIES: Allergies  Allergen Reactions   Hydrocodone Other (See Comments)    "I break into a violent sweat--fast."   Penicillins     Profuse sweating Swelling of the face/tongue/throat, SOB, or low BP? N Sudden or severe rash/hives, skin peeling, or the inside of the mouth or nose? n Did it require medical treatment? N When did it last happen?unk If all above answers are NO, may proceed with cephalosporin use.    Codeine    Hydrocodone-Acetaminophen Swelling    HOME MEDICATIONS: Current Outpatient Medications  Medication Sig Dispense Refill   metFORMIN (GLUCOPHAGE-XR) 500 MG 24 hr tablet Take 500 mg by mouth every evening.   0   omeprazole (PRILOSEC) 20 MG capsule Take 1 capsule (20 mg total) by mouth daily. 30 capsule 0   No current facility-administered medications for this visit.    PAST MEDICAL HISTORY: Past Medical History:  Diagnosis Date   AV block    Cervical radicular pain    Diverticulitis of colon    Gastritis    HTN (hypertension)    Hyperlipidemia    Morbid obesity ([place])    Normal coronary angiogram 2004   Palpitations    Radiculitis of leg    Sleep apnea    Thoracic spine pain     PAST SURGICAL HISTORY: Past Surgical History:  Procedure Laterality Date  CARDIAC CATHETERIZATION  11/17/2002   NORMAL. EF 65%    FAMILY HISTORY: Family History  Problem Relation Age of Onset   Multiple sclerosis Mother    Hypertension Father    Cancer Father     SOCIAL HISTORY: Social History   Socioeconomic History   Marital status: Married    Spouse name: Not on file   Number of children: Not on file   Years of education: Not on file   Highest education level: Not on file  Occupational History   Not on  file  Tobacco Use   Smoking status: Never Smoker   Smokeless tobacco: Never Used  Substance and Sexual Activity   Alcohol use: No   Drug use: No   Sexual activity: Yes  Other Topics Concern   Not on file  Social History Narrative   Not on file   Social Determinants of Health   Financial Resource Strain:    Difficulty of Paying Living Expenses: Not on file  Food Insecurity:    Worried About [place] in the Last Year: Not on file   Ran Out of Food in the Last Year: Not on file  Transportation Needs:    Lack of Transportation (Medical): Not on file   Lack of Transportation (Non-Medical): Not on file  Physical Activity:    Days of Exercise per Week: Not on file   Minutes of Exercise per Session: Not on file  Stress:    Feeling of Stress : Not on file  Social Connections:    Frequency of Communication with Friends and Family: Not on file   Frequency of Social Gatherings with Friends and Family: Not on file   Attends Religious Services: Not on file   Active Member of Clubs or Organizations: Not on file   Attends Archivist Meetings: Not on file   Marital Status: Not on file  Intimate Partner Violence:    Fear of Current or Ex-Partner: Not on file   Emotionally Abused: Not on file   Physically Abused: Not on file   Sexually Abused: Not on file     PHYSICAL EXAM   Vitals:   10/21/19 1012  BP: (!) 144/98  Pulse: 78  Temp: (!) 97.4 F (36.3 C)  TempSrc: Temporal  Weight: 235 lb (106.6 kg)  Height: 5\' 8"  (1.727 m)    Not recorded      Body mass index is 35.73 kg/m.  PHYSICAL EXAMNIATION:  Gen: NAD, conversant, well nourised, well groomed                     Cardiovascular: Regular rate rhythm, no peripheral edema, warm, nontender. Eyes: Conjunctivae clear without exudates or hemorrhage Neck: Supple, no carotid bruits. Pulmonary: Clear to auscultation bilaterally   NEUROLOGICAL EXAM:  MENTAL STATUS: Speech:     Speech is normal; fluent and spontaneous with normal comprehension.  Cognition:     Orientation to time, place and person     Normal recent and remote memory     Normal Attention span and concentration     Normal Language, naming, repeating,spontaneous speech     Fund of knowledge   CRANIAL NERVES: CN II: Visual fields are full to confrontation. Pupils are round equal and briskly reactive to light. CN III, IV, VI: extraocular movement are normal. No ptosis. CN V: Facial sensation is intact to light touch CN VII: Face is symmetric with normal eye closure  CN VIII: Hearing is normal to causal  conversation. CN IX, X: Phonation is normal. CN XI: Head turning and shoulder shrug are intact  MOTOR: There is no pronator drift of out-stretched arms. Muscle bulk and tone are normal. Muscle strength is normal.  REFLEXES: Reflexes are 2+ and symmetric at the biceps, triceps, knees, and ankles. Plantar responses are flexor.  SENSORY: Intact to light touch, pinprick and vibratory sensation are intact in fingers and toes.  COORDINATION: There is no trunk or limb dysmetria noted.  GAIT/STANCE: Posture is normal. Gait is steady with normal steps, base, arm swing, and turning. Heel and toe walking are normal. Tandem gait is normal.  Romberg is absent.   DIAGNOSTIC DATA (LABS, IMAGING, TESTING) - I reviewed patient records, labs, notes, testing and imaging myself where available.   ASSESSMENT AND PLAN  ASHLY MALTESE is a 54 y.o. male   Left labyrinthitis, left side hyper vestibular function  Refer him to vestibular rehab  Only return to clinic for new issues   Marcial Pacas, M.D. Ph.D.  [facility] [address], [place], [place] [address] Ph: [phone] Fax: [phone]  CC: London Pepper, MD

## 2019-11-24 DIAGNOSIS — G4733 Obstructive sleep apnea (adult) (pediatric): Secondary | ICD-10-CM | POA: Diagnosis not present

## 2019-11-25 ENCOUNTER — Other Ambulatory Visit: Payer: Self-pay

## 2019-11-25 ENCOUNTER — Encounter: Payer: Self-pay | Admitting: Internal Medicine

## 2019-11-25 ENCOUNTER — Ambulatory Visit (INDEPENDENT_AMBULATORY_CARE_PROVIDER_SITE_OTHER): Payer: BC Managed Care – PPO | Admitting: Internal Medicine

## 2019-11-25 VITALS — BP 142/82 | HR 106 | Temp 97.7°F | Ht 68.0 in | Wt 230.2 lb

## 2019-11-25 DIAGNOSIS — E782 Mixed hyperlipidemia: Secondary | ICD-10-CM | POA: Diagnosis not present

## 2019-11-25 DIAGNOSIS — R931 Abnormal findings on diagnostic imaging of heart and coronary circulation: Secondary | ICD-10-CM | POA: Diagnosis not present

## 2019-11-25 DIAGNOSIS — I10 Essential (primary) hypertension: Secondary | ICD-10-CM | POA: Diagnosis not present

## 2019-11-25 MED ORDER — ROSUVASTATIN CALCIUM 20 MG PO TABS: 20.0000 mg | ORAL_TABLET | Freq: Every day | ORAL | 3 refills | Status: DC

## 2019-11-25 NOTE — Patient Instructions (Signed)
Medication Instructions:  START aspirin 81mg  daily START crestor 20mg  daily  *If you need a refill on your cardiac medications before your next appointment, please call your pharmacy*   Lab Work: FASTING lab work in 3 months to check cholesterol  -- complete about 1 week before next visit   If you have labs (blood work) drawn today and your tests are completely normal, you will receive your results only by: Marland Kitchen MyChart Message (if you have MyChart) OR . A paper copy in the mail If you have any lab test that is abnormal or we need to change your treatment, we will call you to review the results.   Testing/Procedures: NONE   Follow-Up: At [facility], you and your health needs are our priority.  As part of our continuing mission to provide you with exceptional heart care, we have created designated Provider Care Teams.  These Care Teams include your primary Cardiologist (physician) and Advanced Practice Providers (APPs -  Physician Assistants and Nurse Practitioners) who all work together to provide you with the care you need, when you need it.  We recommend signing up for the patient portal called "MyChart".  Sign up information is provided on this After Visit Summary.  MyChart is used to connect with patients for Virtual Visits (Telemedicine).  Patients are able to view lab/test results, encounter notes, upcoming appointments, etc.  Non-urgent messages can be sent to your provider as well.   To learn more about what you can do with MyChart, go to [email or web address].    Your next appointment:   3 month(s) - lipid clinic  The format for your next appointment:   Either In Person or Virtual  Provider:   K. Mali Hilty, MD   Other Instructions

## 2019-11-26 ENCOUNTER — Encounter: Payer: Self-pay | Admitting: Internal Medicine

## 2019-11-26 NOTE — Progress Notes (Signed)
LIPID CLINIC CONSULT NOTE  Chief Complaint:  Manage dyslipidemia  Primary Care Physician: Robert Pepper, Schultz  Primary Cardiologist:  No primary care provider on file.  HPI:  Robert Schultz is a 54 y.o. male who is being seen today for the evaluation of dyslipidemia at the request of Robert Schultz.  This is a pleasant 54 year old male with cardiovascular risk factors and family history of heart disease in his father who was recently referred to Robert Schultz and felt to have atypical chest pain.  He ordered a coronary calcium score as well as a nuclear stress test.  The stress test was low risk showed an EF of 59% with no ischemia.  I personally reviewed his coronary calcium score which was surprisingly elevated, 133 with calcification of all major coronary arteries.  8th percentile for age and sex matched control and definitely indicates age advanced coronary disease.  Based on this, aggressive risk factor modification was recommended and he was referred to the lipid disorders and risk reduction clinic.  Most recent lipid profile by his primary care provider in August 2020 revealed total cholesterol 211, LDL 130 and HDL of 36.  Triglycerides were 224.  He is not currently on a statin medication and has been reluctant to take him in the past as he had a feeling of "crawling out of his skin" on atorvastatin.  He has not tried other statins.  His target LDL is less than 70.  PMHx:  Past Medical History:  Diagnosis Date  . AV block   . Cervical radicular pain   . Diverticulitis of colon   . Gastritis   . HTN (hypertension)   . Hyperlipidemia   . Morbid obesity ([place])   . Normal coronary angiogram 2004  . Palpitations   . Radiculitis of leg   . Sleep apnea   . Thoracic spine pain     Past Surgical History:  Procedure Laterality Date  . CARDIAC CATHETERIZATION  11/17/2002   NORMAL. EF 65%    FAMHx:  Family History  Problem Relation Age of Onset  . Multiple sclerosis Mother     . Hypertension Father   . Cancer Father     SOCHx:   reports that he has never smoked. He has never used smokeless tobacco. He reports that he does not drink alcohol or use drugs.  ALLERGIES:  Allergies  Allergen Reactions  . Hydrocodone Other (See Comments)    "I break into a violent sweat--fast."  . Penicillins     Profuse sweating Swelling of the face/tongue/throat, SOB, or low BP? N Sudden or severe rash/hives, skin peeling, or the inside of the mouth or nose? n Did it require medical treatment? N When did it last happen?unk If all above answers are "NO", may proceed with cephalosporin use.   . Atorvastatin     Restlessness   . Codeine   . Hydrocodone-Acetaminophen Swelling    ROS: Pertinent items noted in HPI and remainder of comprehensive ROS otherwise negative.  HOME MEDS: Current Outpatient Medications on File Prior to Visit  Medication Sig Dispense Refill  . aspirin EC 81 MG tablet Take 81 mg by mouth daily.    . metFORMIN (GLUCOPHAGE-XR) 500 MG 24 hr tablet Take 500 mg by mouth every evening.   0  . omeprazole (PRILOSEC) 20 MG capsule Take 1 capsule (20 mg total) by mouth daily. 30 capsule 0   No current facility-administered medications on file prior to visit.  LABS/IMAGING: No results found for this or any previous visit (from the past 48 hour(s)). No results found.  LIPID PANEL:    Component Value Date/Time   CHOL 127 09/15/2018 1202   TRIG 122 09/15/2018 1202   HDL 37 (L) 09/15/2018 1202   CHOLHDL 3.4 09/15/2018 1202   VLDL 24 09/15/2018 1202   LDLCALC 66 09/15/2018 1202   LDLDIRECT 152.5 11/19/2012 0900    WEIGHTS: Wt Readings from Last 3 Encounters:  11/25/19 230 lb 3.2 oz (104.4 kg)  10/21/19 235 lb (106.6 kg)  09/10/19 238 lb (108 kg)    VITALS: BP (!) 142/82   Pulse (!) 106   Temp 97.7 F (36.5 C)   Ht 5\' 8"  (1.727 m)   Wt 230 lb 3.2 oz (104.4 kg)   SpO2 99%   BMI 35.00 kg/m    EXAM: Deferred  EKG: Deferred  ASSESSMENT: 1. Mixed dyslipidemia, goal LDL less than 70 2. 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 Recent low risk Myoview stress test (08/2019) 4. Family history of premature coronary disease in his father 16. Hypertension  PLAN: 1.   Robert Schultz has dyslipidemia but had been relatively intolerant of atorvastatin due to "feeling like he was crawling out of his skin".  This is probably best described as some restlessness and may be a form of myalgia.  I advised we try an alternative statin and feel that we could reach target on high potency rosuvastatin 20 mg daily.  If this is also not well-tolerated then he may be a candidate for PCSK9 inhibitor.  We will plan repeat lipids in about 3 months.  Hopefully the statin will address both his triglycerides and LDL cholesterol.  Additional dietary changes are recommended and were discussed today and I provided him with dietary information.  Thanks again for the kind referral.  Robert Casino, Schultz, FACC, [place] Director of the Advanced Lipid Disorders &  Cardiovascular Risk Reduction Clinic Diplomate of the American Board of Clinical Lipidology Attending Cardiologist  Direct Dial: [phone]  Fax: [phone]  Website:  [email or web address] 11/26/2019, 11:28 AM

## 2019-12-01 DIAGNOSIS — G4733 Obstructive sleep apnea (adult) (pediatric): Secondary | ICD-10-CM | POA: Diagnosis not present

## 2019-12-21 DIAGNOSIS — R0902 Hypoxemia: Secondary | ICD-10-CM | POA: Diagnosis not present

## 2020-02-22 DIAGNOSIS — G4733 Obstructive sleep apnea (adult) (pediatric): Secondary | ICD-10-CM | POA: Diagnosis not present

## 2020-03-02 ENCOUNTER — Ambulatory Visit: Payer: BC Managed Care – PPO | Admitting: Internal Medicine

## 2020-03-17 DIAGNOSIS — E119 Type 2 diabetes mellitus without complications: Secondary | ICD-10-CM | POA: Diagnosis not present

## 2020-03-17 DIAGNOSIS — Z125 Encounter for screening for malignant neoplasm of prostate: Secondary | ICD-10-CM | POA: Diagnosis not present

## 2020-03-17 DIAGNOSIS — E785 Hyperlipidemia, unspecified: Secondary | ICD-10-CM | POA: Diagnosis not present

## 2020-03-17 DIAGNOSIS — E559 Vitamin D deficiency, unspecified: Secondary | ICD-10-CM | POA: Diagnosis not present

## 2020-03-21 DIAGNOSIS — E119 Type 2 diabetes mellitus without complications: Secondary | ICD-10-CM | POA: Diagnosis not present

## 2020-03-21 DIAGNOSIS — Z0000 Encounter for general adult medical examination without abnormal findings: Secondary | ICD-10-CM | POA: Diagnosis not present

## 2020-04-04 DIAGNOSIS — H10812 Pingueculitis, left eye: Secondary | ICD-10-CM | POA: Diagnosis not present

## 2020-05-18 ENCOUNTER — Ambulatory Visit: Payer: BC Managed Care – PPO | Admitting: Internal Medicine

## 2020-05-22 DIAGNOSIS — G4733 Obstructive sleep apnea (adult) (pediatric): Secondary | ICD-10-CM | POA: Diagnosis not present

## 2020-05-25 ENCOUNTER — Ambulatory Visit (INDEPENDENT_AMBULATORY_CARE_PROVIDER_SITE_OTHER): Payer: BC Managed Care – PPO | Admitting: Internal Medicine

## 2020-05-25 ENCOUNTER — Other Ambulatory Visit: Payer: Self-pay

## 2020-05-25 ENCOUNTER — Encounter: Payer: Self-pay | Admitting: Internal Medicine

## 2020-05-25 VITALS — BP 130/89 | HR 78 | Ht 68.0 in | Wt 232.4 lb

## 2020-05-25 DIAGNOSIS — Z8249 Family history of ischemic heart disease and other diseases of the circulatory system: Secondary | ICD-10-CM | POA: Diagnosis not present

## 2020-05-25 DIAGNOSIS — I10 Essential (primary) hypertension: Secondary | ICD-10-CM | POA: Diagnosis not present

## 2020-05-25 DIAGNOSIS — E782 Mixed hyperlipidemia: Secondary | ICD-10-CM | POA: Diagnosis not present

## 2020-05-25 DIAGNOSIS — R931 Abnormal findings on diagnostic imaging of heart and coronary circulation: Secondary | ICD-10-CM

## 2020-05-25 NOTE — Progress Notes (Signed)
LIPID CLINIC CONSULT NOTE  Chief Complaint:  Manage dyslipidemia  Primary Care Physician: London Pepper, MD  Primary Cardiologist:  No primary care provider on file.  HPI:  Robert Schultz is a 54 y.o. male who is being seen today for the evaluation of dyslipidemia at the request of London Pepper, MD.  This is a pleasant 54 year old male with cardiovascular risk factors and family history of heart disease in his father who was recently referred to Dr. Gwenlyn Found and felt to have atypical chest pain.  He ordered a coronary calcium score as well as a nuclear stress test.  The stress test was low risk showed an EF of 59% with no ischemia.  I personally reviewed his coronary calcium score which was surprisingly elevated, 133 with calcification of all major coronary arteries.  8th percentile for age and sex matched control and definitely indicates age advanced coronary disease.  Based on this, aggressive risk factor modification was recommended and he was referred to the lipid disorders and risk reduction clinic.  Most recent lipid profile by his primary care provider in August 2020 revealed total cholesterol 211, LDL 130 and HDL of 36.  Triglycerides were 224.  He is not currently on a statin medication and has been reluctant to take him in the past as he had a feeling of "crawling out of his skin" on atorvastatin.  He has not tried other statins.  His target LDL is less than 70.  05/25/2020  Robert Schultz is seen today in follow-up.  Overall he is doing well.  He seems to be tolerating rosuvastatin 20 mg.  Additionally this is been very effective for him.  His total cholesterol is now down to 122, triglycerides 236, HDL 36 and LDL 49 which is significant reduction from an LDL of 130 previously.  He has none of the issues that he reported previously with atorvastatin.  He is very pleased with the medication.  Has had a slight decrease in hemoglobin A1c however remains elevated at 8.2.  He is currently on  Metformin.  We discussed the possibility of using Jardiance and he says that his primary care provider ready has prescribed it but he has not started taking it yet.  I think this would be additionally helpful and the cardiovascular risk reduction data is very good.  PMHx:  Past Medical History:  Diagnosis Date  . AV block   . Cervical radicular pain   . Diverticulitis of colon   . Gastritis   . HTN (hypertension)   . Hyperlipidemia   . Morbid obesity ([place])   . Normal coronary angiogram 2004  . Palpitations   . Radiculitis of leg   . Sleep apnea   . Thoracic spine pain     Past Surgical History:  Procedure Laterality Date  . CARDIAC CATHETERIZATION  11/17/2002   NORMAL. EF 65%    FAMHx:  Family History  Problem Relation Age of Onset  . Multiple sclerosis Mother   . Hypertension Father   . Cancer Father     SOCHx:   reports that he has never smoked. He has never used smokeless tobacco. He reports that he does not drink alcohol and does not use drugs.  ALLERGIES:  Allergies  Allergen Reactions  . Hydrocodone Other (See Comments)    "I break into a violent sweat--fast."  . Penicillins     Profuse sweating Swelling of the face/tongue/throat, SOB, or low BP? N Sudden or severe rash/hives, skin peeling, or the  inside of the mouth or nose? n Did it require medical treatment? N When did it last happen?unk If all above answers are "NO", may proceed with cephalosporin use.   . Atorvastatin     Restlessness   . Codeine   . Hydrocodone-Acetaminophen Swelling    ROS: Pertinent items noted in HPI and remainder of comprehensive ROS otherwise negative.  HOME MEDS: Current Outpatient Medications on File Prior to Visit  Medication Sig Dispense Refill  . aspirin EC 81 MG tablet Take 81 mg by mouth daily.    . metFORMIN (GLUCOPHAGE-XR) 500 MG 24 hr tablet Take 500 mg by mouth every evening.   0  . omeprazole (PRILOSEC) 20 MG capsule Take 1 capsule (20 mg total) by mouth  daily. 30 capsule 0  . rosuvastatin (CRESTOR) 20 MG tablet Take 1 tablet (20 mg total) by mouth daily. 90 tablet 3   No current facility-administered medications on file prior to visit.    LABS/IMAGING: No results found for this or any previous visit (from the past 48 hour(s)). No results found.  LIPID PANEL:    Component Value Date/Time   CHOL 127 09/15/2018 1202   TRIG 122 09/15/2018 1202   HDL 37 (L) 09/15/2018 1202   CHOLHDL 3.4 09/15/2018 1202   VLDL 24 09/15/2018 1202   LDLCALC 66 09/15/2018 1202   LDLDIRECT 152.5 11/19/2012 0900    WEIGHTS: Wt Readings from Last 3 Encounters:  05/25/20 232 lb 6.4 oz (105.4 kg)  11/25/19 230 lb 3.2 oz (104.4 kg)  10/21/19 235 lb (106.6 kg)    VITALS: BP 130/89   Pulse 78   Ht 5\' 8"  (1.727 m)   Wt 232 lb 6.4 oz (105.4 kg)   SpO2 99%   BMI 35.34 kg/m   EXAM: General appearance: alert and no distress Lungs: clear to auscultation bilaterally Heart: regular rate and rhythm Extremities: extremities normal, atraumatic, no cyanosis or edema Neurologic: Mental status: Alert, oriented, thought content appropriate  EKG: Deferred  ASSESSMENT: 1. Mixed dyslipidemia, goal LDL less than 70 2. 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 Recent low risk Myoview stress test (08/2019) 4. Family history of premature coronary disease in his father 48. Hypertension  PLAN: 1.   Robert Schultz has had a very good response to rosuvastatin with LDL cholesterol now 49.  Triglycerides remain elevated however his A1c is still not at goal.  He is supposed to start on Jardiance which I agree with and should be excellent in helping him with a little weight loss and lowering his sugars.  I have encouraged more dietary interventions.  He needs to stay well-hydrated on that medication and should have follow-up labs to monitor renal function.  He is asymptomatic and exam was benign today.  Plan follow-up with me annually and he can follow-up  with Dr. Gwenlyn Found for his general cardiology needs.  Pixie Casino, MD, [facility], [place] Director of the Advanced Lipid Disorders &  Cardiovascular Risk Reduction Clinic Diplomate of the American Board of Clinical Lipidology Attending Cardiologist  Direct Dial: [phone]  Fax: [phone]  Website:  [email or web address]  Robert Schultz 05/25/2020, 8:14 AM

## 2020-05-25 NOTE — Patient Instructions (Addendum)
Medication Instructions:  Your physician recommends that you continue on your current medications as directed. Please refer to the Current Medication list given to you today.  *If you need a refill on your cardiac medications before your next appointment, please call your pharmacy*   Lab Work: None ordered  If you have labs (blood work) drawn today and your tests are completely normal, you will receive your results only by: Marland Kitchen MyChart Message (if you have MyChart) OR . A paper copy in the mail If you have any lab test that is abnormal or we need to change your treatment, we will call you to review the results.   Testing/Procedures: None ordered   Follow-Up: At [facility], you and your health needs are our priority.  As part of our continuing mission to provide you with exceptional heart care, we have created designated Provider Care Teams.  These Care Teams include your primary Cardiologist (physician) and Advanced Practice Providers (APPs -  Physician Assistants and Nurse Practitioners) who all work together to provide you with the care you need, when you need it.  We recommend signing up for the patient portal called "MyChart".  Sign up information is provided on this After Visit Summary.  MyChart is used to connect with patients for Virtual Visits (Telemedicine).  Patients are able to view lab/test results, encounter notes, upcoming appointments, etc.  Non-urgent messages can be sent to your provider as well.   To learn more about what you can do with MyChart, go to [email or web address].    Your next appointment:   12 month(s)  The format for your next appointment:   In Person  Provider:   K. Mali Hilty, MD   Other Instructions

## 2020-06-01 DIAGNOSIS — Z20822 Contact with and (suspected) exposure to covid-19: Secondary | ICD-10-CM | POA: Diagnosis not present

## 2020-06-13 DIAGNOSIS — Z23 Encounter for immunization: Secondary | ICD-10-CM | POA: Diagnosis not present

## 2020-06-25 IMAGING — CT CT HEART SCORING
2 series · 16 of 20 positions shown, 18 images · non-contrast
Comparison: 04/20/2018
COMPARISON: 04/20/2018

Addendum:
EXAM:
OVER-READ INTERPRETATION  CT CHEST

The following report is an over-read performed by radiologist Dr.
Libradon Ansine [REDACTED] on 09/22/2019. This
over-read does not include interpretation of cardiac or coronary
anatomy or pathology. The coronary calcium score interpretation by
the cardiologist is attached.
CLINICAL DATA: Risk stratification
Coronary Calcium Score
TECHNIQUE: The patient was scanned on a Siemens Force scanner. Axial
non-contrast 3 mm slices were carried out through the heart. The
data set was analyzed on a dedicated work station and scored using
the Agatson method.

[Series 2: casc 3.0 i36f 2 bestdiast 66 % · axial · 0.40mm/px · z∈[-237,-144]mm · 8 of 41 slices shown, 10 images]
[im 5/41  vessel]
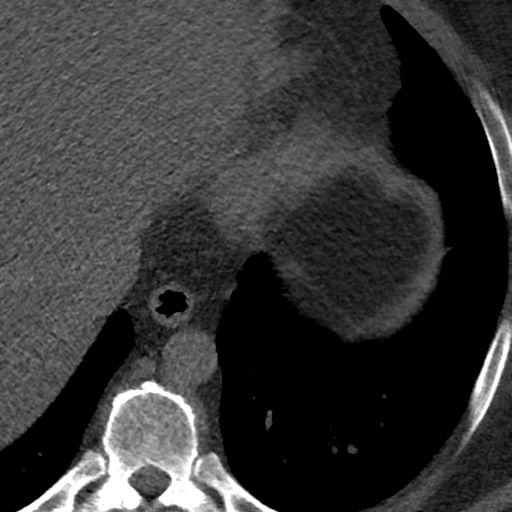
[im 5/41  lung]
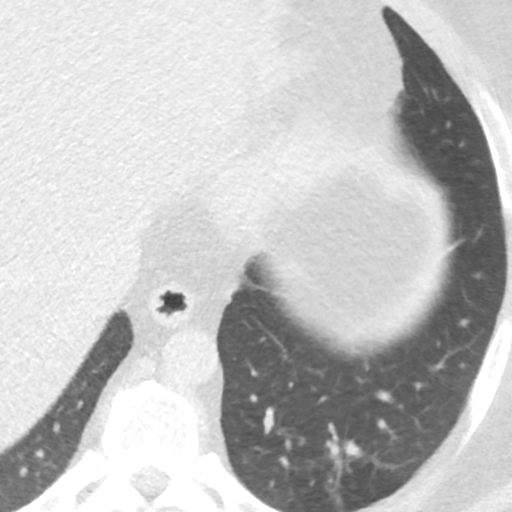
[im 9/41  vessel]
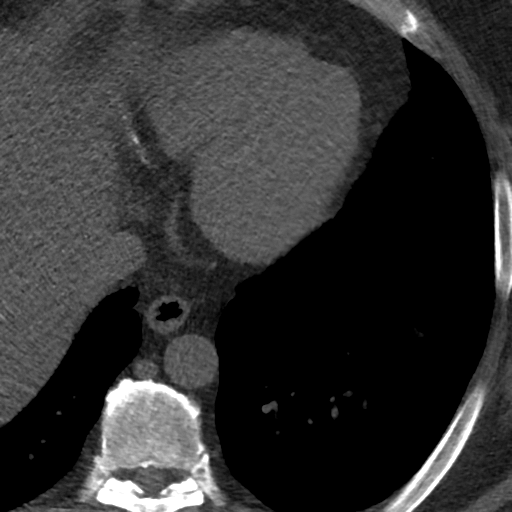
[im 14/41  vessel]
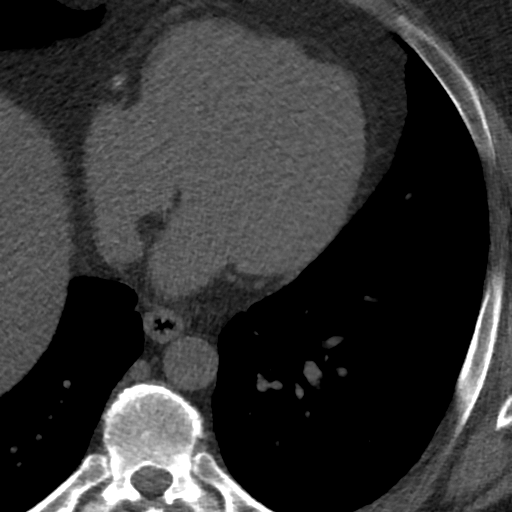
[im 18/41  vessel]
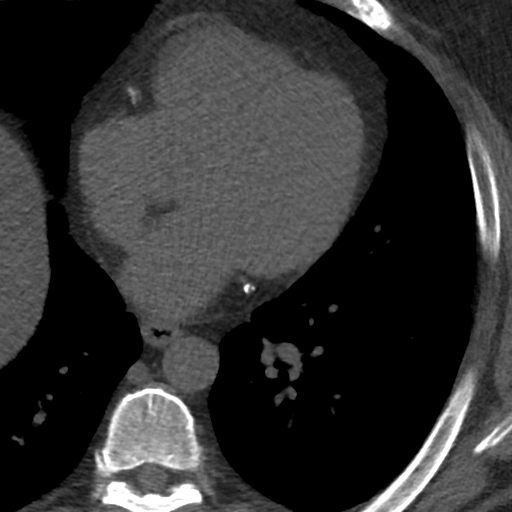
[im 23/41  vessel]
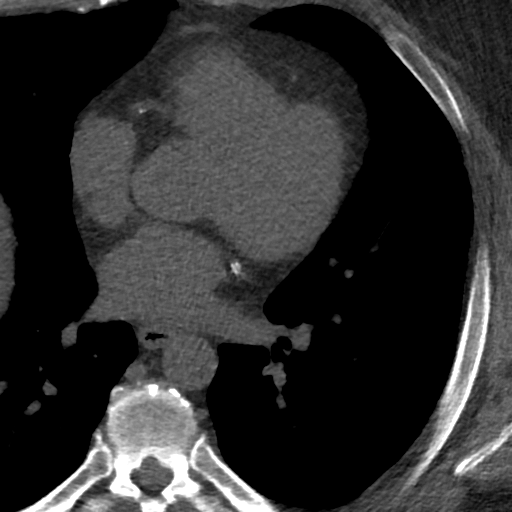
[im 23/41  lung]
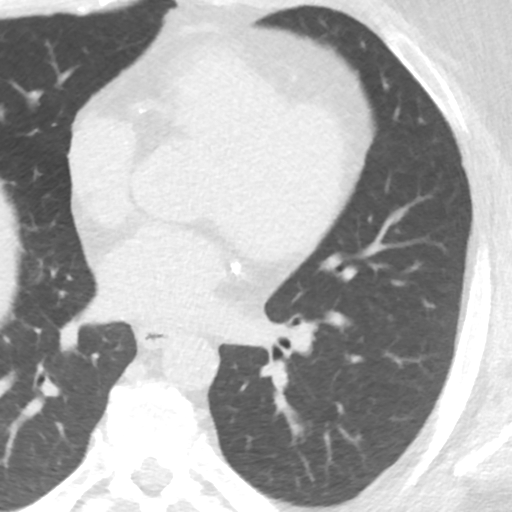
[im 27/41  vessel]
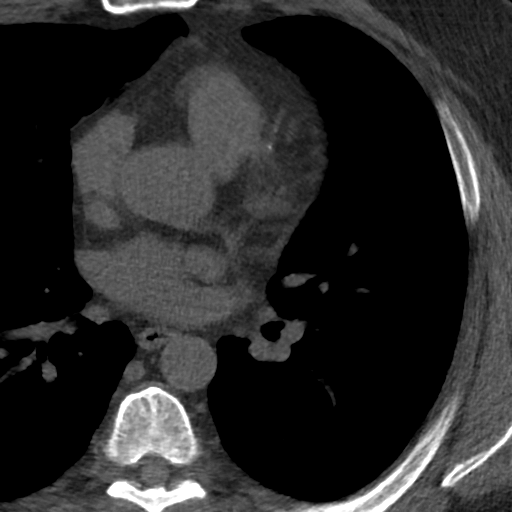
[im 32/41  vessel]
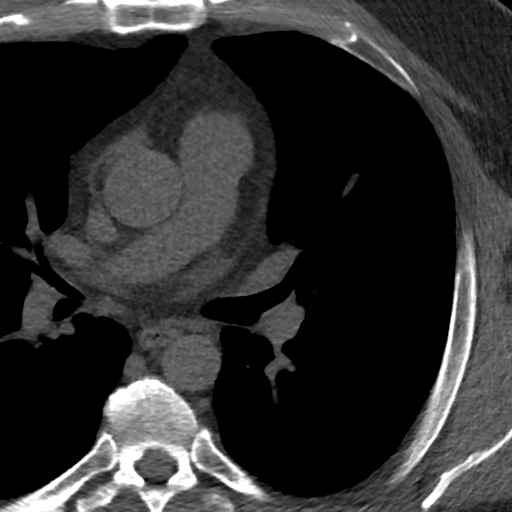
[im 36/41  vessel]
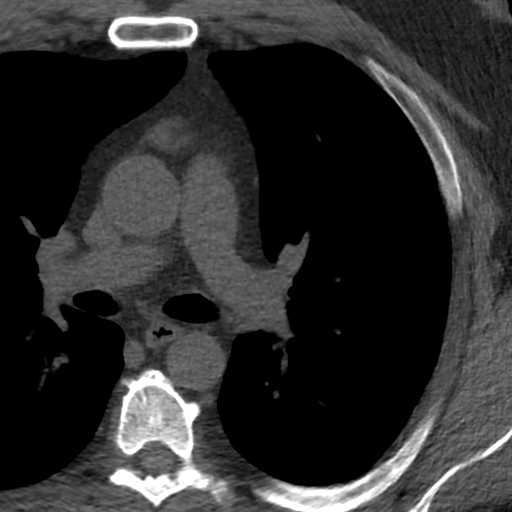

[Series 4: lung st 70 % · axial · 0.77mm/px · z∈[-237,-144]mm · 8 of 41 slices shown]
[im 5/41  lung]
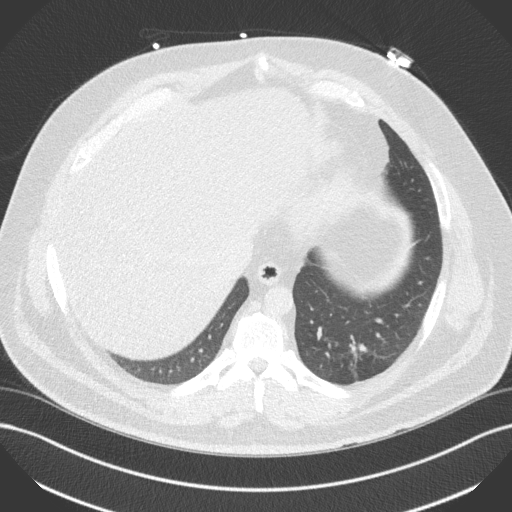
[im 9/41  lung]
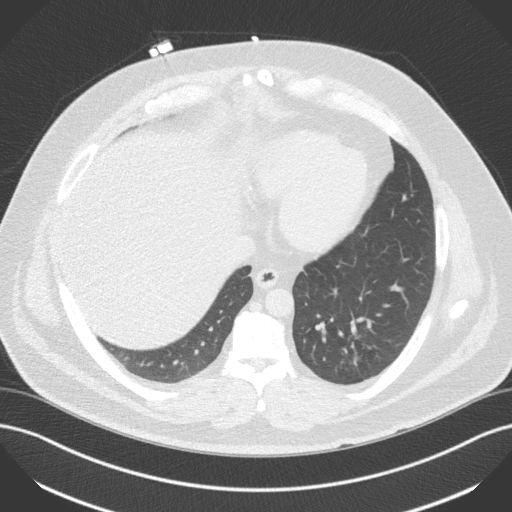
[im 14/41  lung]
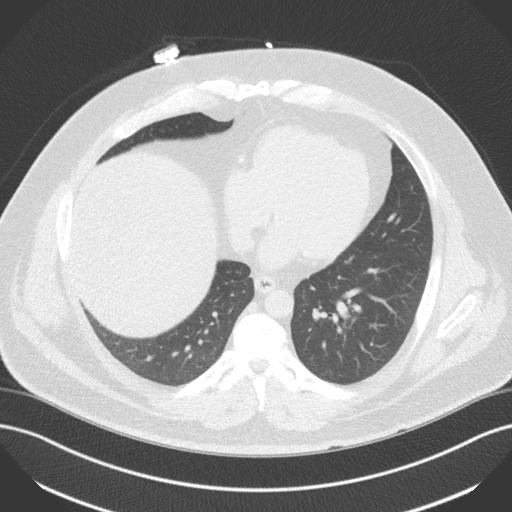
[im 18/41  lung]
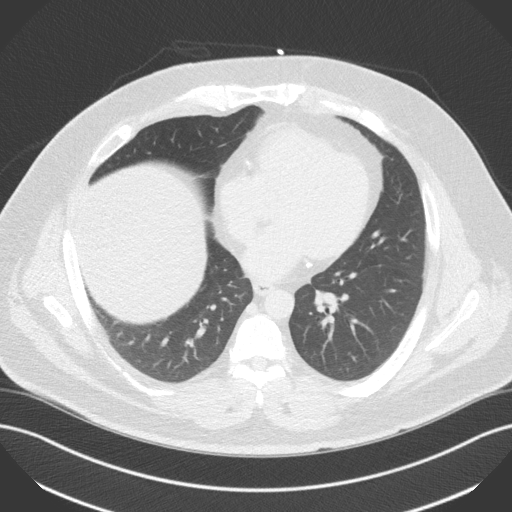
[im 23/41  lung]
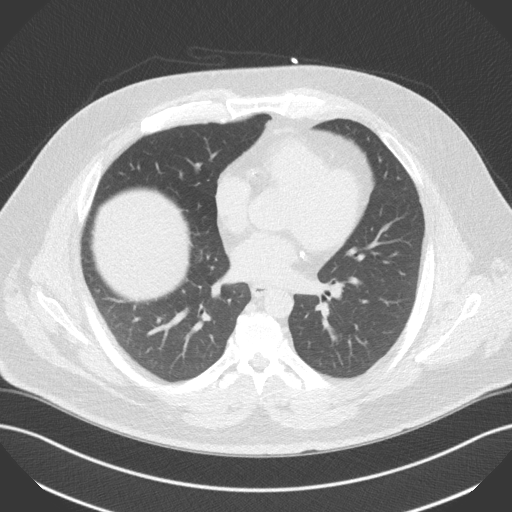
[im 27/41  lung]
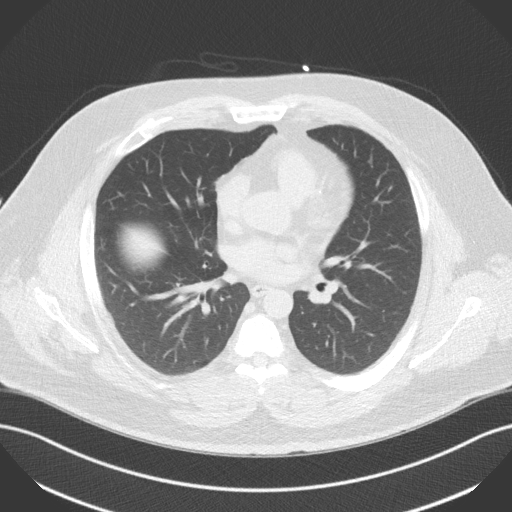
[im 32/41  lung]
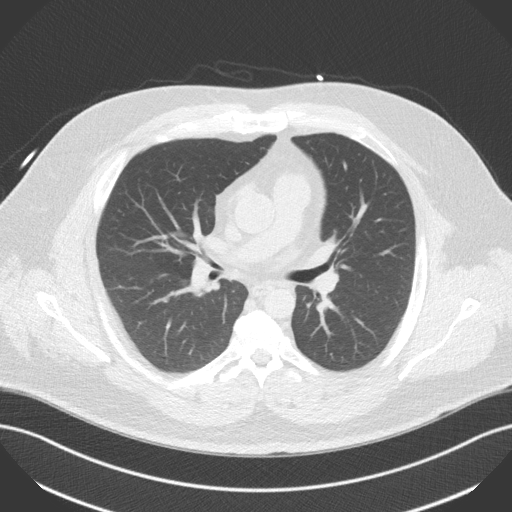
[im 36/41  lung]
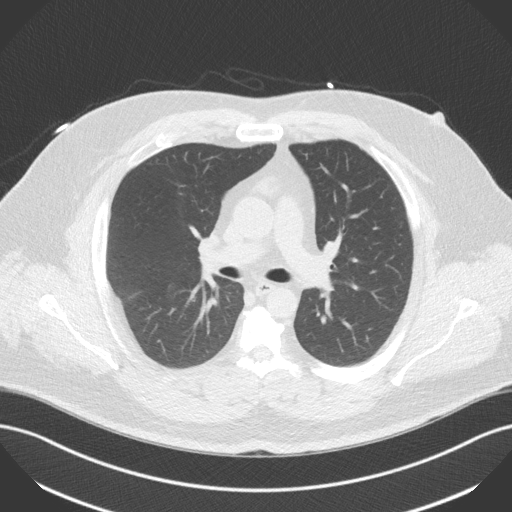

[16 of 20 positions shown; findings below may reference images not displayed]

FINDINGS: Vascular: Heart is normal size. Visualized aorta normal caliber with
punctate aortic arch calcifications.

Mediastinum/Nodes: No adenopathy in the lower mediastinum or hila.

Lungs/Pleura: Flat nodule seen in the right lower lobe measures 4-5
mm, stable since prior study. No confluent opacities or effusions.

Upper Abdomen: Diffuse fatty infiltration of the liver.

Musculoskeletal: Chest wall soft tissues are unremarkable. No acute
bony abnormality.
IMPRESSION: Flat 4 mm nodule in the anterior right lower lobe, stable since
prior study. Appearance and stability suggest a benign nodule. This
could be followed with repeat CT in 1 year to ensure 2 year
stability from original study.

Fatty infiltration of the liver.
FINDINGS: Non-cardiac: See separate report from [REDACTED].

Ascending Aorta: Atherosclerotic calcification.  Normal caliber.

Pericardium: Normal

Coronary arteries: Normal origins.  Multivessel coronary calcium.
IMPRESSION: Coronary calcium score of 533. This was 98th percentile for age and
sex matched control

*** End of Addendum ***
EXAM:
OVER-READ INTERPRETATION  CT CHEST

The following report is an over-read performed by radiologist Dr.
Libradon Ansine [REDACTED] on 09/22/2019. This
over-read does not include interpretation of cardiac or coronary
anatomy or pathology. The coronary calcium score interpretation by
the cardiologist is attached.
FINDINGS: Vascular: Heart is normal size. Visualized aorta normal caliber with
punctate aortic arch calcifications.

Mediastinum/Nodes: No adenopathy in the lower mediastinum or hila.

Lungs/Pleura: Flat nodule seen in the right lower lobe measures 4-5
mm, stable since prior study. No confluent opacities or effusions.

Upper Abdomen: Diffuse fatty infiltration of the liver.

Musculoskeletal: Chest wall soft tissues are unremarkable. No acute
bony abnormality.
IMPRESSION: Flat 4 mm nodule in the anterior right lower lobe, stable since
prior study. Appearance and stability suggest a benign nodule. This
could be followed with repeat CT in 1 year to ensure 2 year
stability from original study.

Fatty infiltration of the liver.

## 2020-08-08 ENCOUNTER — Other Ambulatory Visit: Payer: Self-pay

## 2020-08-08 ENCOUNTER — Emergency Department (INDEPENDENT_AMBULATORY_CARE_PROVIDER_SITE_OTHER)
Admission: EM | Admit: 2020-08-08 | Discharge: 2020-08-08 | Disposition: A | Discharge status: Home / Self Care | Payer: BC Managed Care – PPO | Source: Home / Self Care | Attending: Family Medicine | Admitting: Family Medicine

## 2020-08-08 DIAGNOSIS — J069 Acute upper respiratory infection, unspecified: Secondary | ICD-10-CM

## 2020-08-08 DIAGNOSIS — R059 Cough, unspecified: Secondary | ICD-10-CM | POA: Diagnosis not present

## 2020-08-08 MED ORDER — PREDNISONE 20 MG PO TABS: ORAL_TABLET | ORAL | 0 refills | Status: DC

## 2020-08-08 NOTE — Discharge Instructions (Addendum)
Take plain guaifenesin (1200mg  extended release tabs such as Mucinex) twice daily, with plenty of water, for cough and congestion.  Get adequate rest.   May use Afrin nasal spray (or generic oxymetazoline) each morning for about 5 days and then discontinue.  Also recommend using saline nasal spray several times daily and saline nasal irrigation (AYR is a common brand).  Use Flonase nasal spray each morning after using Afrin nasal spray and saline nasal irrigation. Try warm salt water gargles for sore throat.  Stop all antihistamines (Allegra, etc) for now, and other non-prescription cough/cold preparations. May take Tylenol as needed for body aches, headache, etc. May take Delsym Cough Suppressant ("12 Hour Cough Relief") at bedtime for nighttime cough.

## 2020-08-08 NOTE — ED Triage Notes (Addendum)
Pt c/o cold sxs since Sunday. Cough, yellow phlegm, nasal congestion, chills but no fever. Also fatigue. No known covid exposure. Has had all three covid vaccinations. Hx of seasonal allergies. Taking Allegra and tylenol prn.

## 2020-08-08 NOTE — ED Provider Notes (Signed)
[facility]    CSN: [id number] Arrival date & time: 08/08/20  1452      History   Chief Complaint Chief Complaint  Patient presents with  . Cough    HPI Robert Schultz is a 54 y.o. male.   Four days ago patient developed a sore throat and nasal congestion, followed by fatigue and myalgias.  Yesterday he developed a partly productive cough, hoarseness, and chills.  He denies pleuritic pain and shortness of breath. He has seasonal allergic rhinitis (worse in the autumn), and a past history of pneumonia.  The history is provided by the patient.    Past Medical History:  Diagnosis Date  . AV block   . Cervical radicular pain   . Diverticulitis of colon   . Gastritis   . HTN (hypertension)   . Hyperlipidemia   . Morbid obesity ([place])   . Normal coronary angiogram 2004  . Palpitations   . Radiculitis of leg   . Sleep apnea   . Thoracic spine pain     Patient Active Problem List   Diagnosis Date Noted  . Vestibulitis of ear, left 10/21/2019  . Atypical chest pain 09/07/2019  . Family history of heart disease 09/07/2019  . Acute pancreatitis 09/16/2018  . Pancreatitis 09/15/2018  . Hepatic steatosis 09/15/2018  . Obesity 09/15/2018  . GERD (gastroesophageal reflux disease) 09/15/2018  . CAP (community acquired pneumonia) 07/02/2014  . Sepsis ([place]) 07/02/2014  . DM (diabetes mellitus) ([place]) 08/26/2013  . Numbness 08/26/2013  . Cardiovascular risk factor 04/04/2011  . HTN (hypertension) 03/08/2011  . Heart palpitations 03/08/2011  . Hyperlipidemia 03/08/2011  . OBSTRUCTIVE SLEEP APNEA 05/19/2009  . 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 NASAL POLYP 05/19/2009  . MIGRAINES, HX OF 05/19/2009    Past Surgical History:  Procedure Laterality Date  . CARDIAC CATHETERIZATION  11/17/2002   NORMAL. EF 65%       Home Medications    Prior to Admission medications   Medication Sig Start Date End Date Taking? Authorizing Provider  aspirin EC 81 MG tablet Take 81  mg by mouth daily.    [provider]  metFORMIN (GLUCOPHAGE-XR) 500 MG 24 hr tablet Take 500 mg by mouth every evening.  12/12/16   [provider]  omeprazole (PRILOSEC) 20 MG capsule Take 1 capsule (20 mg total) by mouth daily. 12/16/16   Varney Biles, MD  predniSONE (DELTASONE) 20 MG tablet Take one tab by mouth twice daily for 4 days, then one daily. Take with food. 08/08/20   Kandra Nicolas, MD  rosuvastatin (CRESTOR) 20 MG tablet Take 1 tablet (20 mg total) by mouth daily. 11/25/19 05/25/20  Pixie Casino, MD    Family History Family History  Problem Relation Age of Onset  . Multiple sclerosis Mother   . Hypertension Father   . Cancer Father     Social History Social History   Tobacco Use  . Smoking status: Never Smoker  . Smokeless tobacco: Never Used  Vaping Use  . Vaping Use: Never used  Substance Use Topics  . Alcohol use: No  . Drug use: No     Allergies   Hydrocodone, Penicillins, Atorvastatin, Codeine, and Hydrocodone-acetaminophen   Review of Systems Review of Systems + sore throat + hoarse + cough No pleuritic pain No wheezing + nasal congestion + post-nasal drainage No sinus pain/pressure No itchy/red eyes No earache but ears feel clogged No hemoptysis No SOB No fever/+ chills No nausea No vomiting No  abdominal pain No diarrhea No urinary symptoms No skin rash + fatigue + myalgias No headache Used OTC meds (Allegra) without relief   Physical Exam Triage Vital Signs ED Triage Vitals  Enc Vitals Group     BP 08/08/20 1518 (!) 147/91     Pulse Rate 08/08/20 1518 (!) 101     Resp 08/08/20 1518 18     Temp 08/08/20 1518 98.1 F (36.7 C)     Temp Source 08/08/20 1518 Oral     SpO2 08/08/20 1518 98 %     Weight --      Height --      Head Circumference --      Peak Flow --      Pain Score 08/08/20 1520 0     Pain Loc --      Pain Edu? --      Excl. in [place]? --    No data found.  Updated Vital Signs BP (!)  147/91 (BP Location: Right Arm)   Pulse (!) 101   Temp 98.1 F (36.7 C) (Oral)   Resp 18   SpO2 98%   Visual Acuity Right Eye Distance:   Left Eye Distance:   Bilateral Distance:    Right Eye Near:   Left Eye Near:    Bilateral Near:     Physical Exam Nursing notes and Vital Signs reviewed. Appearance:  Patient appears stated age, and in no acute distress Eyes:  Pupils are equal, round, and reactive to light and accomodation.  Extraocular movement is intact.  Conjunctivae are not inflamed  Ears:  Canals normal.  Tympanic membranes normal.  Nose:  Mildly congested turbinates.  No sinus tenderness. Pharynx:  Normal Neck:  Supple.  Mildly enlarged lateral nodes are present, tender to palpation on the left.   Lungs:  Clear to auscultation.  Breath sounds are equal.  Moving air well. Heart:  Regular rate and rhythm without murmurs, rubs, or gallops.  Abdomen:  Nontender without masses or hepatosplenomegaly.  Bowel sounds are present.  No CVA or flank tenderness.  Extremities:  No edema.  Skin:  No rash present.   UC Treatments / Results  Labs (all labs ordered are listed, but only abnormal results are displayed) Labs Reviewed  NOVEL CORONAVIRUS, NAA    EKG   Radiology No results found.  Procedures Procedures (including critical care time)  Medications Ordered in UC Medications - No data to display  Initial Impression / Assessment and Plan / UC Course  I have reviewed the triage vital signs and the nursing notes.  Pertinent labs & imaging results that were available during my care of the patient were reviewed by me and considered in my medical decision making (see chart for details).    There is no evidence of bacterial infection today.  Begin prednisone burst/taper. Followup with Family Doctor if not improved in about 10 days.   Final Clinical Impressions(s) / UC Diagnoses   Final diagnoses:  Viral URI with cough     Discharge Instructions     Take plain  guaifenesin (1200mg  extended release tabs such as Mucinex) twice daily, with plenty of water, for cough and congestion.  Get adequate rest.   May use Afrin nasal spray (or generic oxymetazoline) each morning for about 5 days and then discontinue.  Also recommend using saline nasal spray several times daily and saline nasal irrigation (AYR is a common brand).  Use Flonase nasal spray each morning after using Afrin nasal spray and  saline nasal irrigation. Try warm salt water gargles for sore throat.  Stop all antihistamines (Allegra, etc) for now, and other non-prescription cough/cold preparations. May take Tylenol as needed for body aches, headache, etc. May take Delsym Cough Suppressant ("12 Hour Cough Relief") at bedtime for nighttime cough.     ED Prescriptions    Medication Sig Dispense Auth. Provider   predniSONE (DELTASONE) 20 MG tablet Take one tab by mouth twice daily for 4 days, then one daily. Take with food. 12 tablet Kandra Nicolas, MD        Kandra Nicolas, MD 08/10/20 1155

## 2020-08-10 LAB — SARS-COV-2, NAA 2 DAY TAT

## 2020-08-10 LAB — NOVEL CORONAVIRUS, NAA: SARS-CoV-2, NAA: NOT DETECTED

## 2020-08-21 DIAGNOSIS — G4733 Obstructive sleep apnea (adult) (pediatric): Secondary | ICD-10-CM | POA: Diagnosis not present

## 2020-08-25 DIAGNOSIS — J40 Bronchitis, not specified as acute or chronic: Secondary | ICD-10-CM | POA: Diagnosis not present

## 2020-08-25 DIAGNOSIS — R042 Hemoptysis: Secondary | ICD-10-CM | POA: Diagnosis not present

## 2020-08-25 DIAGNOSIS — R071 Chest pain on breathing: Secondary | ICD-10-CM | POA: Diagnosis not present

## 2020-08-25 DIAGNOSIS — R059 Cough, unspecified: Secondary | ICD-10-CM | POA: Diagnosis not present

## 2020-08-29 DIAGNOSIS — K219 Gastro-esophageal reflux disease without esophagitis: Secondary | ICD-10-CM | POA: Diagnosis not present

## 2020-08-29 DIAGNOSIS — J343 Hypertrophy of nasal turbinates: Secondary | ICD-10-CM | POA: Diagnosis not present

## 2020-08-29 DIAGNOSIS — H6123 Impacted cerumen, bilateral: Secondary | ICD-10-CM | POA: Diagnosis not present

## 2020-09-05 ENCOUNTER — Ambulatory Visit (INDEPENDENT_AMBULATORY_CARE_PROVIDER_SITE_OTHER): Payer: BC Managed Care – PPO | Admitting: Cardiovascular Disease

## 2020-09-05 ENCOUNTER — Encounter: Payer: Self-pay | Admitting: Cardiovascular Disease

## 2020-09-05 ENCOUNTER — Other Ambulatory Visit: Payer: Self-pay

## 2020-09-05 DIAGNOSIS — R0789 Other chest pain: Secondary | ICD-10-CM

## 2020-09-05 DIAGNOSIS — E782 Mixed hyperlipidemia: Secondary | ICD-10-CM

## 2020-09-05 DIAGNOSIS — I10 Essential (primary) hypertension: Secondary | ICD-10-CM

## 2020-09-05 DIAGNOSIS — R002 Palpitations: Secondary | ICD-10-CM | POA: Diagnosis not present

## 2020-09-05 DIAGNOSIS — R931 Abnormal findings on diagnostic imaging of heart and coronary circulation: Secondary | ICD-10-CM | POA: Insufficient documentation

## 2020-09-05 NOTE — Assessment & Plan Note (Signed)
History of heart palpitations with known PVCs in the past.  He admits to being under a lot of pressure recently because of his son.

## 2020-09-05 NOTE — Assessment & Plan Note (Signed)
History of hyperlipidemia on Crestor followed by Dr. Debara Pickett in lipid clinic.  His most recent lipid profile performed 03/17/2020 revealed total cholesterol 122, LDL 49 and HDL 36.  Triglyceride level was elevated at 236.

## 2020-09-05 NOTE — Progress Notes (Signed)
09/05/2020 Robert Schultz   [birth year]  [id number]  Primary Physician Robert Pepper, MD Primary Cardiologist: Robert Harp MD Robert Schultz, [place]  HPI:  Robert Schultz is a 54 y.o.  moderately overweight married Caucasian male father of 3 children referred by Dr. Orland Schultz for cardiovascular valuation because of occasional dizziness and atypical chest pain.  He formally saw Dr. Doreatha Schultz as a cardiologist years ago.  I last saw him in the office 09/07/2019. He did apparently have a Holter monitor that showed PVCs and an abnormal cardiac cath.  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  His risk factors include type 2 diabetes on Metformin, mild hyperlipidemia intolerant to statin therapy and family history of heart disease with a father who had stents in his 72s.  He has been taking care of his parents his caregivers for the last 45 half years and unfortunately his father passed away 08/07/2023 of this year at age 63 of cancer.  He is dealing with this currently.  He was put on Jardiance by his PCP and noticed the dizziness at that time which is since subsided after Jardiance was discontinued.  He has had infrequent episodes of atypical chest pain.  He does admit to dietary indiscretion.  The only exercise he gets is occasionally walking his dog.  Since I saw him last I did get a coronary calcium score 09/22/2019 which was 533.  Subsequent Myoview stress test performed 09/10/2019 was nonischemic.  I did refer him to [facility]ultz for lipid management and his most recent lipid profile performed 03/18/2019 did reveal total cholesterol 122, LDL 49 and HDL of 36 with a triglyceride level of 236.  He was recently seen in the urgent care center for hemoptysis thought to be related to an upper respiratory tract infection.  He was treated with prednisone and an oral antibiotic.  He was complaining some atypical  chest pain/chest wall pain and costochondritic symptoms.   No outpatient medications have been marked as taking for the 09/05/20 encounter (Office Visit) with Robert Harp, MD.     Allergies  Allergen Reactions  . Hydrocodone Other (See Comments)    "I break into a violent sweat--fast."  . Penicillins     Profuse sweating Swelling of the face/tongue/throat, SOB, or low BP? N Sudden or severe rash/hives, skin peeling, or the inside of the mouth or nose? n Did it require medical treatment? N When did it last happen?unk If all above answers are "NO", may proceed with cephalosporin use.   . Atorvastatin     Restlessness   . Codeine   . Hydrocodone-Acetaminophen Swelling    Social History   Socioeconomic History  . Marital status: Married    Spouse name: Not on file  . Number of children: Not on file  . Years of education: Not on file  . Highest education level: Not on file  Occupational History  . Not on file  Tobacco Use  . Smoking status: Never Smoker  . Smokeless tobacco: Never Used  Vaping Use  . Vaping Use: Never used  Substance and Sexual Activity  . Alcohol use: No  . Drug use: No  . Sexual activity: Yes  Other Topics Concern  . Not on file  Social History Narrative  . Not on file   Social Determinants of Health   Financial Resource Strain: Not  on file  Food Insecurity: Not on file  Transportation Needs: Not on file  Physical Activity: Not on file  Stress: Not on file  Social Connections: Not on file  Intimate Partner Violence: Not on file     Review of Systems: General: negative for chills, fever, night sweats or weight changes.  Cardiovascular: negative for chest pain, dyspnea on exertion, edema, orthopnea, palpitations, paroxysmal nocturnal dyspnea or shortness of breath Dermatological: negative for rash Respiratory: negative for cough or wheezing Urologic: negative for hematuria Abdominal: negative for nausea, vomiting, diarrhea,  bright red blood per rectum, melena, or hematemesis Neurologic: negative for visual changes, syncope, or dizziness All other systems reviewed and are otherwise negative except as noted above.    Blood pressure 126/86, pulse 85, height 5\' 8"  (1.727 m), weight 230 lb (104.3 kg), SpO2 99 %.  General appearance: alert and no distress Neck: no adenopathy, no carotid bruit, no JVD, supple, symmetrical, trachea midline and thyroid not enlarged, symmetric, no tenderness/mass/nodules Lungs: clear to auscultation bilaterally Heart: regular rate and rhythm, S1, S2 normal, no murmur, click, rub or gallop Extremities: extremities normal, atraumatic, no cyanosis or edema Pulses: 2+ and symmetric Skin: Skin color, texture, turgor normal. No rashes or lesions Neurologic: Alert and oriented X 3, normal strength and tone. Normal symmetric reflexes. Normal coordination and gait  EKG sinus rhythm at 85 without ST or T wave changes.  Personally reviewed this EKG.  ASSESSMENT AND PLAN:   HTN (hypertension) History of essential hypertension a blood pressure measured today at 126/86.  He is not on antihypertensive medications.  Heart palpitations History of heart palpitations with known PVCs in the past.  He admits to being under a lot of pressure recently because of his son.  Hyperlipidemia History of hyperlipidemia on Crestor followed by Robert Schultz in lipid clinic.  His most recent lipid profile performed 03/17/2020 revealed total cholesterol 122, LDL 49 and HDL 36.  Triglyceride level was elevated at 236.  Atypical chest pain History of atypical chest pain with a negative Myoview stress test performed 09/10/2019.  He was recently seen in the ER for hemoptysis and treated for upper respiratory tract infection.  Does complain of nonischemic chest pain symptoms that sound like costochondritis.  Elevated coronary artery calcium score Calcium score performed 09/22/2019 was 533.  Subsequent Myoview stress test  was nonischemic.  We did talk about importance of aggressively modify risk factors including lipids which she has done nicely.      Robert Harp MD FACP,FACC,FAHA, [facility] Schultz 09/05/2020 11:39 AM

## 2020-09-05 NOTE — Patient Instructions (Addendum)
Medication Instructions:  Your physician recommends that you continue on your current medications as directed. Please refer to the Current Medication list given to you today.  *If you need a refill on your cardiac medications before your next appointment, please call your pharmacy*  Follow-Up: At [facility], you and your health needs are our priority.  As part of our continuing mission to provide you with exceptional heart care, we have created designated Provider Care Teams.  These Care Teams include your primary Cardiologist (physician) and Advanced Practice Providers (APPs -  Physician Assistants and Nurse Practitioners) who all work together to provide you with the care you need, when you need it.  We recommend signing up for the patient portal called "MyChart".  Sign up information is provided on this After Visit Summary.  MyChart is used to connect with patients for Virtual Visits (Telemedicine).  Patients are able to view lab/test results, encounter notes, upcoming appointments, etc.  Non-urgent messages can be sent to your provider as well.   To learn more about what you can do with MyChart, go to [email or web address].    Your next appointment:   3 month(s)  The format for your next appointment:   In Person  Provider:   You will see one of the following Advanced Practice Providers on your designated Care Team:    Kerin Ransom, PA-C  [place], [place]  Coletta Memos, [place]  Then, Dr. Quay Burow, will plan to see you again in 6 month(s).

## 2020-09-05 NOTE — Assessment & Plan Note (Signed)
Calcium score performed 09/22/2019 was 533.  Subsequent Myoview stress test was nonischemic.  We did talk about importance of aggressively modify risk factors including lipids which she has done nicely.

## 2020-09-05 NOTE — Assessment & Plan Note (Signed)
History of atypical chest pain with a negative Myoview stress test performed 09/10/2019.  He was recently seen in the ER for hemoptysis and treated for upper respiratory tract infection.  Does complain of nonischemic chest pain symptoms that sound like costochondritis.

## 2020-09-05 NOTE — Assessment & Plan Note (Signed)
History of essential hypertension a blood pressure measured today at 126/86.  He is not on antihypertensive medications.

## 2020-09-27 DIAGNOSIS — R1013 Epigastric pain: Secondary | ICD-10-CM | POA: Diagnosis not present

## 2020-09-27 DIAGNOSIS — Z1159 Encounter for screening for other viral diseases: Secondary | ICD-10-CM | POA: Diagnosis not present

## 2020-09-27 DIAGNOSIS — K8020 Calculus of gallbladder without cholecystitis without obstruction: Secondary | ICD-10-CM | POA: Diagnosis not present

## 2020-09-27 DIAGNOSIS — K2970 Gastritis, unspecified, without bleeding: Secondary | ICD-10-CM | POA: Diagnosis not present

## 2020-09-27 DIAGNOSIS — Z8719 Personal history of other diseases of the digestive system: Secondary | ICD-10-CM | POA: Diagnosis not present

## 2020-09-29 ENCOUNTER — Other Ambulatory Visit: Payer: Self-pay | Admitting: Physician Assistant

## 2020-09-29 DIAGNOSIS — R1013 Epigastric pain: Secondary | ICD-10-CM

## 2020-10-03 DIAGNOSIS — R1010 Upper abdominal pain, unspecified: Secondary | ICD-10-CM | POA: Diagnosis not present

## 2020-10-03 DIAGNOSIS — K2930 Chronic superficial gastritis without bleeding: Secondary | ICD-10-CM | POA: Diagnosis not present

## 2020-10-16 ENCOUNTER — Ambulatory Visit
Admission: RE | Admit: 2020-10-16 | Discharge: 2020-10-16 | Disposition: A | Discharge status: Home / Self Care | Payer: BC Managed Care – PPO | Source: Ambulatory Visit | Attending: Physician Assistant | Admitting: Physician Assistant

## 2020-10-16 DIAGNOSIS — K8020 Calculus of gallbladder without cholecystitis without obstruction: Secondary | ICD-10-CM | POA: Diagnosis not present

## 2020-10-16 DIAGNOSIS — K7689 Other specified diseases of liver: Secondary | ICD-10-CM | POA: Diagnosis not present

## 2020-10-16 DIAGNOSIS — K760 Fatty (change of) liver, not elsewhere classified: Secondary | ICD-10-CM | POA: Diagnosis not present

## 2020-10-16 DIAGNOSIS — N281 Cyst of kidney, acquired: Secondary | ICD-10-CM | POA: Diagnosis not present

## 2020-10-16 DIAGNOSIS — R1013 Epigastric pain: Secondary | ICD-10-CM

## 2020-11-06 DIAGNOSIS — K8020 Calculus of gallbladder without cholecystitis without obstruction: Secondary | ICD-10-CM | POA: Diagnosis not present

## 2020-11-06 DIAGNOSIS — K2970 Gastritis, unspecified, without bleeding: Secondary | ICD-10-CM | POA: Diagnosis not present

## 2020-12-03 ENCOUNTER — Other Ambulatory Visit: Payer: Self-pay | Admitting: Internal Medicine

## 2020-12-04 ENCOUNTER — Other Ambulatory Visit: Payer: Self-pay | Admitting: Surgery

## 2020-12-04 DIAGNOSIS — K8020 Calculus of gallbladder without cholecystitis without obstruction: Secondary | ICD-10-CM | POA: Diagnosis not present

## 2020-12-05 ENCOUNTER — Ambulatory Visit: Payer: BC Managed Care – PPO | Admitting: General Practice

## 2020-12-20 DIAGNOSIS — G4733 Obstructive sleep apnea (adult) (pediatric): Secondary | ICD-10-CM | POA: Diagnosis not present

## 2021-01-01 NOTE — Progress Notes (Signed)
Cardiology Clinic Note   Patient Name: Robert Schultz Date of Encounter: 01/02/2021  Primary Care Provider:  London Pepper, MD Primary Cardiologist:  Quay Burow, MD  Patient Profile    Robert Schultz 55 year old male presents to the clinic today for follow-up evaluation of his hypertension and palpitations.  Past Medical History    Past Medical History:  Diagnosis Date  . AV block   . Cervical radicular pain   . Diverticulitis of colon   . Gastritis   . HTN (hypertension)   . Hyperlipidemia   . Morbid obesity ([place])   . Normal coronary angiogram 2004  . Palpitations   . Radiculitis of leg   . Sleep apnea   . Thoracic spine pain    Past Surgical History:  Procedure Laterality Date  . CARDIAC CATHETERIZATION  11/17/2002   NORMAL. EF 65%    Allergies  Allergies  Allergen Reactions  . Hydrocodone Other (See Comments)    "I break into a violent sweat--fast."  . Penicillins     Profuse sweating Swelling of the face/tongue/throat, SOB, or low BP? N Sudden or severe rash/hives, skin peeling, or the inside of the mouth or nose? n Did it require medical treatment? N When did it last happen?unk If all above answers are "NO", may proceed with cephalosporin use.   . Atorvastatin     Restlessness   . Codeine   . Hydrocodone-Acetaminophen Swelling    History of Present Illness    Robert Schultz has a PMH of HTN, elevated coronary calcium score, OSA on CPAP, GERD, diabetes mellitus, HLD, obesity, and atypical chest pain.  He is statin intolerant.  He was initially seen by Dr. Gwenlyn Found for evaluation of occasional dizziness and atypical chest pain.  He was seen in the office 09/07/2019.  He had worn a Holter monitor that showed PVCs and had a previous abnormal cardiac cath.  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  He has been taking care of his parents for the last 20 years.   His father passed away 08-03-2023 at age 63 from cancer.  He was put on Jardiance by his PCP and noticed dizziness.  His dizziness subsided after discontinuation of Jardiance.  He has also had infrequent episodes of atypical chest pain.  He has had dietary indiscretion in the past.  He previously reported occasional exercise walking his dog.  He was last seen by Dr. Gwenlyn Found on 09/05/2020.  During that time his 533 calcium score was discussed which was from 09/22/2019.  He underwent Myoview stress test 09/10/2019 which was nonischemic.  He was referred to Dr. Debara Pickett for lipid management which showed a total cholesterol of 122 and LDL of 49 on 03/18/2019.  During his last visit he reported that he had gone to urgent care due to episodes of hemoptysis that were felt to be related to upper respiratory tract infection.  He was treated with prednisone and antibiotics.  He did complain of some atypical chest pain/chest wall pain and costochondritic symptoms.  He presents the clinic today for follow-up evaluation states he feels well.  He does have some increased stress with his mother being ill.  He reports that this is been ongoing.  He continues to maintain his 3 businesses.  He reports in the summer that he drives over 4259 miles per month.  He has 380,000 miles on a 2003 Suburban.  We reviewed his LDL cholesterol.  He reports he could be more physically active but continues to care for his mother.  I will give him the salty 6 diet sheet, have him continue to increase his physical activity as tolerated, and follow-up in 12 months.  Today he denies chest pain, shortness of breath, lower extremity edema, fatigue, palpitations, melena, hematuria, hemoptysis, diaphoresis, weakness, presyncope, syncope, orthopnea, and PND.   Home Medications    Prior to Admission medications   Medication Sig Start Date End Date Taking? Authorizing Provider  aspirin EC 81 MG tablet Take 81 mg by mouth daily.    [provider]  metFORMIN (GLUCOPHAGE-XR) 500 MG 24 hr tablet Take 500 mg by mouth every evening.  12/12/16   [provider]  omeprazole (PRILOSEC) 20 MG capsule Take 1 capsule (20 mg total) by mouth daily. 12/16/16   Varney Biles, MD  rosuvastatin (CRESTOR) 20 MG tablet Take 1 tablet (20 mg total) by mouth daily. Keep upcoming appointment 12/04/20   Pixie Casino, MD    Family History    Family History  Problem Relation Age of Onset  . Multiple sclerosis Mother   . Hypertension Father   . Cancer Father    He indicated that his mother is alive. He indicated that his father is alive. He indicated that his sister is alive. He indicated that his brother is alive.  Social History    Social History   Socioeconomic History  . Marital status: Married    Spouse name: Not on file  . Number of children: Not on file  . Years of education: Not on file  . Highest education level: Not on file  Occupational History  . Not on file  Tobacco Use  . Smoking status: Never Smoker  . Smokeless tobacco: Never Used  Vaping Use  . Vaping Use: Never used  Substance and Sexual Activity  . Alcohol use: No  . Drug use: No  . Sexual activity: Yes  Other Topics Concern  . Not on file  Social History Narrative  . Not on file   Social Determinants of Health   Financial Resource Strain: Not on file  Food Insecurity: Not on file  Transportation Needs: Not on file  Physical Activity: Not on file  Stress: Not on file  Social Connections: Not on file  Intimate Partner Violence: Not on file     Review of Systems    General:  No chills, fever, night sweats or weight changes.  Cardiovascular:  No chest pain, dyspnea on exertion, edema, orthopnea, palpitations, paroxysmal nocturnal dyspnea. Dermatological: No rash, lesions/masses Respiratory: No cough, dyspnea Urologic: No hematuria, dysuria Abdominal:   No nausea, vomiting, diarrhea, bright red blood per rectum, melena, or  hematemesis Neurologic:  No visual changes, wkns, changes in mental status. All other systems reviewed and are otherwise negative except as noted above.  Physical Exam    VS:  BP 128/82   Pulse 85   Ht 5\' 8"  (1.727 m)   Wt 221 lb (100.2 kg)   SpO2 97%   BMI 33.60 kg/m  , BMI Body mass index is 33.6 kg/m. GEN: Well nourished, well developed, in no acute distress. HEENT: normal. Neck: Supple, no JVD, carotid bruits, or masses. Cardiac: RRR, no murmurs, rubs, or gallops. No clubbing, cyanosis, edema.  Radials/DP/PT 2+ and equal bilaterally.  Respiratory:  Respirations regular and unlabored, clear to auscultation bilaterally. GI: Soft, nontender, nondistended, BS + x 4. MS: no deformity or  atrophy. Skin: warm and dry, no rash. Neuro:  Strength and sensation are intact. Psych: Normal affect.  Accessory Clinical Findings    Recent Labs: No results found for requested labs within last 8760 hours.   Recent Lipid Panel    Component Value Date/Time   CHOL 127 09/15/2018 1202   TRIG 122 09/15/2018 1202   HDL 37 (L) 09/15/2018 1202   CHOLHDL 3.4 09/15/2018 1202   VLDL 24 09/15/2018 1202   LDLCALC 66 09/15/2018 1202   LDLDIRECT 152.5 11/19/2012 0900    ECG personally reviewed by me today-none today.  EKG sinus rhythm no ST or T wave deviation 85 bpm  Assessment & Plan   1.   Essential hypertension-BP today 128/82.  Well-controlled at home. Heart healthy low-sodium diet-salty 6 given Increase physical activity as tolerated Maintain blood pressure log  Palpitations-no recent episodes of increased heart rate or irregular beats.  Previously had Holter monitor which showed PVCs. Continue to avoid triggers caffeine, chocolate, EtOH, dehydration etc. Heart healthy low-sodium diet-salty 6 given Increase physical activity as tolerated  Hyperlipidemia- LDL 49 on 03/17/2020 Continue rosuvastatin Heart healthy low-sodium high-fiber diet Increase physical activity as  tolerated Follows with PCP  Atypical chest pain-no recent episodes of chest arm neck or back discomfort.  Has become more physically active with spring and denies episodes of chest discomfort with increased physical activity. Continue to monitor Heart healthy low-sodium diet-salty 6 given Increase physical activity as tolerated  Elevated calcium score-calcium score 533 on 09/22/2019.  Underwent stress test which showed no ischemia. Continue aspirin, rosuvastatin Heart healthy low-sodium diet-salty 6 given Increase physical activity as tolerated   Disposition: Follow-up with Dr. Gwenlyn Found in 12 months.  Jossie Ng. Bridget Westbrooks NP-C    01/02/2021, 8:50 AM [place] [address] Office [phone] Fax [phone]  Notice: This dictation was prepared with Dragon dictation along with smaller phrase technology. Any transcriptional errors that result from this process are unintentional and may not be corrected upon review.  I spent 13 minutes examining this patient, reviewing medications, and using patient centered shared decision making involving her cardiac care.  Prior to her visit I spent greater than 20 minutes reviewing her past medical history,  medications, and prior cardiac tests.

## 2021-01-02 ENCOUNTER — Other Ambulatory Visit: Payer: Self-pay

## 2021-01-02 ENCOUNTER — Encounter: Payer: Self-pay | Admitting: General Practice

## 2021-01-02 ENCOUNTER — Ambulatory Visit (INDEPENDENT_AMBULATORY_CARE_PROVIDER_SITE_OTHER): Payer: BC Managed Care – PPO | Admitting: General Practice

## 2021-01-02 VITALS — BP 128/82 | HR 85 | Ht 68.0 in | Wt 221.0 lb

## 2021-01-02 DIAGNOSIS — E782 Mixed hyperlipidemia: Secondary | ICD-10-CM

## 2021-01-02 DIAGNOSIS — R931 Abnormal findings on diagnostic imaging of heart and coronary circulation: Secondary | ICD-10-CM

## 2021-01-02 DIAGNOSIS — R0789 Other chest pain: Secondary | ICD-10-CM

## 2021-01-02 DIAGNOSIS — I10 Essential (primary) hypertension: Secondary | ICD-10-CM | POA: Diagnosis not present

## 2021-01-02 DIAGNOSIS — R002 Palpitations: Secondary | ICD-10-CM

## 2021-01-02 NOTE — Patient Instructions (Signed)
Medication Instructions:  The current medical regimen is effective;  continue present plan and medications as directed. Please refer to the Current Medication list given to you today.  *If you need a refill on your cardiac medications before your next appointment, please call your pharmacy*  Special Instructions PLEASE READ AND FOLLOW SALTY 6-ATTACHED-1,800mg  daily  PLEASE MAINTAIN PHYSICAL ACTIVITY AS TOLERATED  Follow-Up: Your next appointment:  12  month(s) In Person with You may see Quay Burow, MD OR IF UNAVAILABLE JESSE CLEAVER, FNP-C or Sande Rives, PA-C    Please call our office 2 months in advance to schedule this appointment, PLEASE CALL IN January FOR THIS [facility], you and your health needs are our priority.  As part of our continuing mission to provide you with exceptional heart care, we have created designated Provider Care Teams.  These Care Teams include your primary Cardiologist (physician) and Advanced Practice Providers (APPs -  Physician Assistants and Nurse Practitioners) who all work together to provide you with the care you need, when you need it.            6 SALTY THINGS TO AVOID     1,800MG  DAILY

## 2021-01-22 ENCOUNTER — Encounter (HOSPITAL_BASED_OUTPATIENT_CLINIC_OR_DEPARTMENT_OTHER): Payer: Self-pay | Admitting: Surgery

## 2021-01-22 ENCOUNTER — Other Ambulatory Visit: Payer: Self-pay

## 2021-01-25 ENCOUNTER — Encounter (HOSPITAL_BASED_OUTPATIENT_CLINIC_OR_DEPARTMENT_OTHER)
Admission: RE | Admit: 2021-01-25 | Discharge: 2021-01-25 | Disposition: A | Discharge status: Home / Self Care | Payer: BC Managed Care – PPO | Source: Ambulatory Visit | Attending: Surgery | Admitting: Surgery

## 2021-01-25 DIAGNOSIS — Z01812 Encounter for preprocedural laboratory examination: Secondary | ICD-10-CM | POA: Insufficient documentation

## 2021-01-25 LAB — BASIC METABOLIC PANEL
Anion gap: 8 (ref 5–15)
BUN: 13 mg/dL (ref 6–20)
CO2: 24 mmol/L (ref 22–32)
Calcium: 9.3 mg/dL (ref 8.9–10.3)
Chloride: 104 mmol/L (ref 98–111)
Creatinine, Ser: 0.89 mg/dL (ref 0.61–1.24)
GFR, Estimated: >60 mL/min (ref >60)
Glucose, Bld: 203 mg/dL — ABNORMAL HIGH (ref 70–99)
Potassium: 4.6 mmol/L (ref 3.5–5.1)
Sodium: 136 mmol/L (ref 135–145)

## 2021-01-25 MED ORDER — ENSURE PRE-SURGERY PO LIQD: 296.0000 mL | Freq: Once | ORAL | Status: DC

## 2021-01-25 NOTE — Progress Notes (Signed)

## 2021-01-26 ENCOUNTER — Other Ambulatory Visit (HOSPITAL_COMMUNITY)
Admission: RE | Admit: 2021-01-26 | Discharge: 2021-01-26 | Disposition: A | Discharge status: Home / Self Care | Payer: BC Managed Care – PPO | Source: Ambulatory Visit | Attending: Surgery | Admitting: Surgery

## 2021-01-26 ENCOUNTER — Other Ambulatory Visit: Payer: BC Managed Care – PPO

## 2021-01-26 DIAGNOSIS — Z20822 Contact with and (suspected) exposure to covid-19: Secondary | ICD-10-CM | POA: Insufficient documentation

## 2021-01-26 DIAGNOSIS — Z01812 Encounter for preprocedural laboratory examination: Secondary | ICD-10-CM | POA: Insufficient documentation

## 2021-01-26 LAB — SARS CORONAVIRUS 2 (TAT 6-24 HRS): SARS Coronavirus 2: NEGATIVE

## 2021-01-29 NOTE — H&P (Signed)
Robert Schultz  Location: [facility] Patient #: [id number] DOB: September 23, 1966 Married / Language: English / Race: White Male   History of Present Illness The patient is a 55 year old male who presents for evaluation of gall stones.  Chief complaint: Right upper quadrant abdominal pain  This is a 55 year old gentleman referred to me by Dr. Paulita Fujita for symptomatic gallstones. He has been having intermittent attacks of right upper quadrant abdominal pain with epigastric burning, bloating, and nausea for many years. He had an episode of pancreatitis several years ago. This was felt to be secondary to gallstones. He has had intermittent endoscopies in the past as well. He has had no sounds showing a small gallstone. He has had an unremarkable CT scan, MRI, and HIDA scan. He denies jaundice. Again he reports that he can go several months to 6 months between episodes. It is difficulty related to meals. He does have family history of a sister having had cholecystectomy for gallstones   Past Surgical History  No pertinent past surgical history   Diagnostic Studies History Colonoscopy  1-5 years ago  Allergies (Kheana Marshall-McBride, CMA Codeine/Codeine Derivatives  Penicillins  Hydrocodone Bitart (Antituss) *COUGH/COLD/ALLERGY*  Allergies Reconciled   Medication History (Kheana Marshall-McBride, CMA;   metFORMIN HCl ER (500MG  Tablet ER 24HR, Oral) Active. Pantoprazole Sodium (40MG  Tablet DR, Oral) Active. Doxycycline Hyclate (100MG  Capsule, Oral) Active. Rosuvastatin Calcium (20MG  Tablet, Oral) Active. Medications Reconciled  Social History Sherron Ales Marshall-McBride, CMA;  Caffeine use  Carbonated beverages. No alcohol use  No drug use  Tobacco use  Never smoker.  Family History Sherron Ales Marshall-McBride, CMA;  Cancer  Father. Colon Polyps  Father. Depression  Mother, Sister. Heart Disease  Father, Mother. Hypertension  Father. Kidney  Disease  Father. Melanoma  Father. Prostate Cancer  Father. Respiratory Condition  Father. Thyroid problems  Sister.  Other Problems Sherron Ales Marshall-McBride, CMA; Chest pain  Cholelithiasis  Diabetes Mellitus  Diverticulosis  Gastroesophageal Reflux Disease  Other disease, cancer, significant illness  Pancreatitis  Sleep Apnea     Review of Systems (Kheana Marshall-McBride CMA AM) General Not Present- Appetite Loss, Chills, Fatigue, Fever, Night Sweats, Weight Gain and Weight Loss. Skin Not Present- Change in Wart/Mole, Dryness, Hives, Jaundice, New Lesions, Non-Healing Wounds, Rash and Ulcer. HEENT Present- Seasonal Allergies and Wears glasses/contact lenses. Not Present- Earache, Hearing Loss, Hoarseness, Nose Bleed, Oral Ulcers, Ringing in the Ears, Sinus Pain, Sore Throat, Visual Disturbances and Yellow Eyes. Respiratory Not Present- Bloody sputum, Chronic Cough, Difficulty Breathing, Snoring and Wheezing. Breast Not Present- Breast Mass, Breast Pain, Nipple Discharge and Skin Changes. Cardiovascular Not Present- Chest Pain, Difficulty Breathing Lying Down, Leg Cramps, Palpitations, Rapid Heart Rate, Shortness of Breath and Swelling of Extremities. Gastrointestinal Present- Abdominal Pain. Not Present- Bloating, Bloody Stool, Change in Bowel Habits, Chronic diarrhea, Constipation, Difficulty Swallowing, Excessive gas, Gets full quickly at meals, Hemorrhoids, Indigestion, Nausea, Rectal Pain and Vomiting. Male Genitourinary Not Present- Blood in Urine, Change in Urinary Stream, Frequency, Impotence, Nocturia, Painful Urination, Urgency and Urine Leakage. Musculoskeletal Present- Joint Stiffness. Not Present- Back Pain, Joint Pain, Muscle Pain, Muscle Weakness and Swelling of Extremities. Neurological Not Present- Decreased Memory, Fainting, Headaches, Numbness, Seizures, Tingling, Tremor, Trouble walking and Weakness. Psychiatric Not Present- Anxiety, Bipolar, Change in  Sleep Pattern, Depression, Fearful and Frequent crying. Endocrine Not Present- Cold Intolerance, Excessive Hunger, Hair Changes, Heat Intolerance, Hot flashes and New Diabetes. Hematology Not Present- Blood Thinners, Easy Bruising, Excessive bleeding, Gland problems, HIV and Persistent Infections.  Vitals  Weight: 233 lb Height: 68in Body Surface Area: 2.18 m Body Mass Index: 35.43 kg/m  Temp.: 37F  Pulse: 89 (Regular)  P.OX: 98% (Room air) BP: 132/88(Sitting, Left Arm, Standard)       Physical Exam (Suede Greenawalt A. Ninfa Linden MD; 12/04/2020 9:15 AM) The physical exam findings are as follows: Note: He appears well on exam  His abdomen is soft and nontender. There is no hepatomegaly Lungs clear CV RRR Skin normal Psych normal    Assessment & Plan  SYMPTOMATIC CHOLELITHIASIS (K80.20)  Impression: I have reviewed his notes in the electronic medical records. I reviewed all his x-ray studies. I reviewed the notes from his gastroenterologist. I agree this is most likely symptomatic gallstones. I discussed this with him in detail. I would recommend a laparoscopic cholecystectomy. I discussed the procedure in detail. The patient was given Neurosurgeon. We discussed the risks and benefits of a laparoscopic cholecystectomy and possible cholangiogram including, but not limited to, bleeding, infection, injury to surrounding structures such as the intestine or liver, bile leak, retained gallstones, need to convert to an open procedure, prolonged diarrhea, blood clots such as DVT, common bile duct injury, anesthesia risks, and possible need for additional procedures. The likelihood of improvement in symptoms and return to the patient's normal status is good. We discussed the typical post-operative recovery course. All questions were answered.

## 2021-01-30 ENCOUNTER — Ambulatory Visit (HOSPITAL_BASED_OUTPATIENT_CLINIC_OR_DEPARTMENT_OTHER): Admission: RE | Admit: 2021-01-30 | Payer: BC Managed Care – PPO | Source: Home / Self Care | Admitting: Surgery

## 2021-01-30 SURGERY — LAPAROSCOPIC CHOLECYSTECTOMY
Anesthesia: General

## 2021-02-01 DIAGNOSIS — M25511 Pain in right shoulder: Secondary | ICD-10-CM | POA: Diagnosis not present

## 2021-02-01 DIAGNOSIS — M542 Cervicalgia: Secondary | ICD-10-CM | POA: Diagnosis not present

## 2021-02-22 DIAGNOSIS — M25511 Pain in right shoulder: Secondary | ICD-10-CM | POA: Diagnosis not present

## 2021-02-22 DIAGNOSIS — M542 Cervicalgia: Secondary | ICD-10-CM | POA: Diagnosis not present

## 2021-02-23 ENCOUNTER — Other Ambulatory Visit: Payer: Self-pay | Admitting: Orthopedic Surgery

## 2021-02-23 DIAGNOSIS — M542 Cervicalgia: Secondary | ICD-10-CM

## 2021-02-26 ENCOUNTER — Ambulatory Visit
Admission: RE | Admit: 2021-02-26 | Discharge: 2021-02-26 | Disposition: A | Discharge status: Home / Self Care | Payer: BC Managed Care – PPO | Source: Ambulatory Visit | Attending: Orthopedic Surgery | Admitting: Orthopedic Surgery

## 2021-02-26 DIAGNOSIS — R202 Paresthesia of skin: Secondary | ICD-10-CM | POA: Diagnosis not present

## 2021-02-26 DIAGNOSIS — R200 Anesthesia of skin: Secondary | ICD-10-CM | POA: Diagnosis not present

## 2021-02-26 DIAGNOSIS — M542 Cervicalgia: Secondary | ICD-10-CM

## 2021-02-26 DIAGNOSIS — M50221 Other cervical disc displacement at C4-C5 level: Secondary | ICD-10-CM | POA: Diagnosis not present

## 2021-02-26 DIAGNOSIS — M25519 Pain in unspecified shoulder: Secondary | ICD-10-CM | POA: Diagnosis not present

## 2021-03-01 DIAGNOSIS — E119 Type 2 diabetes mellitus without complications: Secondary | ICD-10-CM | POA: Diagnosis not present

## 2021-03-01 DIAGNOSIS — R748 Abnormal levels of other serum enzymes: Secondary | ICD-10-CM | POA: Diagnosis not present

## 2021-03-01 DIAGNOSIS — R634 Abnormal weight loss: Secondary | ICD-10-CM | POA: Diagnosis not present

## 2021-03-13 DIAGNOSIS — M542 Cervicalgia: Secondary | ICD-10-CM | POA: Diagnosis not present

## 2021-03-14 ENCOUNTER — Ambulatory Visit: Payer: BC Managed Care – PPO | Admitting: Cardiovascular Disease

## 2021-04-03 DIAGNOSIS — M5412 Radiculopathy, cervical region: Secondary | ICD-10-CM | POA: Diagnosis not present

## 2021-04-04 DIAGNOSIS — E785 Hyperlipidemia, unspecified: Secondary | ICD-10-CM | POA: Diagnosis not present

## 2021-04-04 DIAGNOSIS — Z0000 Encounter for general adult medical examination without abnormal findings: Secondary | ICD-10-CM | POA: Diagnosis not present

## 2021-04-04 DIAGNOSIS — Z23 Encounter for immunization: Secondary | ICD-10-CM | POA: Diagnosis not present

## 2021-04-04 DIAGNOSIS — Z125 Encounter for screening for malignant neoplasm of prostate: Secondary | ICD-10-CM | POA: Diagnosis not present

## 2021-04-17 DIAGNOSIS — M5412 Radiculopathy, cervical region: Secondary | ICD-10-CM | POA: Diagnosis not present

## 2021-04-17 DIAGNOSIS — M25511 Pain in right shoulder: Secondary | ICD-10-CM | POA: Diagnosis not present

## 2021-06-06 ENCOUNTER — Other Ambulatory Visit: Payer: Self-pay | Admitting: Internal Medicine

## 2021-06-13 DIAGNOSIS — Z23 Encounter for immunization: Secondary | ICD-10-CM | POA: Diagnosis not present

## 2021-06-26 DIAGNOSIS — G4733 Obstructive sleep apnea (adult) (pediatric): Secondary | ICD-10-CM | POA: Diagnosis not present

## 2021-07-09 DIAGNOSIS — E785 Hyperlipidemia, unspecified: Secondary | ICD-10-CM | POA: Diagnosis not present

## 2021-07-09 DIAGNOSIS — E559 Vitamin D deficiency, unspecified: Secondary | ICD-10-CM | POA: Diagnosis not present

## 2021-07-09 DIAGNOSIS — M542 Cervicalgia: Secondary | ICD-10-CM | POA: Diagnosis not present

## 2021-07-09 DIAGNOSIS — E1165 Type 2 diabetes mellitus with hyperglycemia: Secondary | ICD-10-CM | POA: Diagnosis not present

## 2021-07-09 DIAGNOSIS — Z125 Encounter for screening for malignant neoplasm of prostate: Secondary | ICD-10-CM | POA: Diagnosis not present

## 2021-07-27 DIAGNOSIS — G4733 Obstructive sleep apnea (adult) (pediatric): Secondary | ICD-10-CM | POA: Diagnosis not present

## 2021-08-01 DIAGNOSIS — M5412 Radiculopathy, cervical region: Secondary | ICD-10-CM | POA: Diagnosis not present

## 2021-08-01 DIAGNOSIS — R030 Elevated blood-pressure reading, without diagnosis of hypertension: Secondary | ICD-10-CM | POA: Diagnosis not present

## 2021-08-01 DIAGNOSIS — Z6834 Body mass index (BMI) 34.0-34.9, adult: Secondary | ICD-10-CM | POA: Diagnosis not present

## 2021-08-20 DIAGNOSIS — K2970 Gastritis, unspecified, without bleeding: Secondary | ICD-10-CM | POA: Diagnosis not present

## 2021-08-20 DIAGNOSIS — K8020 Calculus of gallbladder without cholecystitis without obstruction: Secondary | ICD-10-CM | POA: Diagnosis not present

## 2021-08-20 DIAGNOSIS — R1013 Epigastric pain: Secondary | ICD-10-CM | POA: Diagnosis not present

## 2021-08-26 DIAGNOSIS — G4733 Obstructive sleep apnea (adult) (pediatric): Secondary | ICD-10-CM | POA: Diagnosis not present

## 2021-08-27 ENCOUNTER — Other Ambulatory Visit: Payer: Self-pay

## 2021-08-27 ENCOUNTER — Emergency Department (HOSPITAL_BASED_OUTPATIENT_CLINIC_OR_DEPARTMENT_OTHER)
Admission: EM | Admit: 2021-08-27 | Discharge: 2021-08-27 | Disposition: A | Discharge status: Home / Self Care | Payer: BC Managed Care – PPO | Attending: Emergency Medicine | Admitting: Emergency Medicine

## 2021-08-27 ENCOUNTER — Encounter (HOSPITAL_BASED_OUTPATIENT_CLINIC_OR_DEPARTMENT_OTHER): Payer: Self-pay | Admitting: Emergency Medicine

## 2021-08-27 ENCOUNTER — Emergency Department (HOSPITAL_BASED_OUTPATIENT_CLINIC_OR_DEPARTMENT_OTHER): Payer: BC Managed Care – PPO

## 2021-08-27 ENCOUNTER — Emergency Department (HOSPITAL_BASED_OUTPATIENT_CLINIC_OR_DEPARTMENT_OTHER): Payer: BC Managed Care – PPO | Admitting: Radiology

## 2021-08-27 DIAGNOSIS — R110 Nausea: Secondary | ICD-10-CM | POA: Diagnosis not present

## 2021-08-27 DIAGNOSIS — K219 Gastro-esophageal reflux disease without esophagitis: Secondary | ICD-10-CM | POA: Diagnosis not present

## 2021-08-27 DIAGNOSIS — Z7984 Long term (current) use of oral hypoglycemic drugs: Secondary | ICD-10-CM | POA: Diagnosis not present

## 2021-08-27 DIAGNOSIS — E1169 Type 2 diabetes mellitus with other specified complication: Secondary | ICD-10-CM | POA: Insufficient documentation

## 2021-08-27 DIAGNOSIS — R079 Chest pain, unspecified: Secondary | ICD-10-CM

## 2021-08-27 DIAGNOSIS — R0789 Other chest pain: Secondary | ICD-10-CM | POA: Diagnosis not present

## 2021-08-27 DIAGNOSIS — Z79899 Other long term (current) drug therapy: Secondary | ICD-10-CM | POA: Diagnosis not present

## 2021-08-27 DIAGNOSIS — I10 Essential (primary) hypertension: Secondary | ICD-10-CM | POA: Diagnosis not present

## 2021-08-27 DIAGNOSIS — E785 Hyperlipidemia, unspecified: Secondary | ICD-10-CM | POA: Diagnosis not present

## 2021-08-27 LAB — BASIC METABOLIC PANEL
Anion gap: 10 (ref 5–15)
BUN: 12 mg/dL (ref 6–20)
CO2: 28 mmol/L (ref 22–32)
Calcium: 9.3 mg/dL (ref 8.9–10.3)
Chloride: 103 mmol/L (ref 98–111)
Creatinine, Ser: 0.69 mg/dL (ref 0.61–1.24)
GFR, Estimated: >60 mL/min (ref >60)
Glucose, Bld: 156 mg/dL — ABNORMAL HIGH (ref 70–99)
Potassium: 4 mmol/L (ref 3.5–5.1)
Sodium: 141 mmol/L (ref 135–145)

## 2021-08-27 LAB — CBC
HCT: 43.4 % (ref 39.0–52.0)
Hemoglobin: 14.5 g/dL (ref 13.0–17.0)
MCH: 32.2 pg (ref 26.0–34.0)
MCHC: 33.4 g/dL (ref 30.0–36.0)
MCV: 96.2 fL (ref 80.0–100.0)
Platelets: 202 10*3/uL (ref 150–400)
RBC: 4.51 MIL/uL (ref 4.22–5.81)
RDW: 13.2 % (ref 11.5–15.5)
WBC: 8.9 10*3/uL (ref 4.0–10.5)
nRBC: 0 % (ref 0.0–0.2)

## 2021-08-27 LAB — MAGNESIUM: Magnesium: 1.5 mg/dL — ABNORMAL LOW (ref 1.7–2.4)

## 2021-08-27 LAB — TROPONIN I (HIGH SENSITIVITY)
Troponin I (High Sensitivity): 2 ng/L (ref <18)
Troponin I (High Sensitivity): 2 ng/L (ref <18)

## 2021-08-27 MED ORDER — IOHEXOL 350 MG/ML SOLN
75.0000 mL | Freq: Once | INTRAVENOUS | Status: AC | PRN
  Administered 2021-08-27: 75 mL via INTRAVENOUS

## 2021-08-27 MED ORDER — LACTATED RINGERS IV BOLUS
500.0000 mL | Freq: Once | INTRAVENOUS | Status: AC
  Administered 2021-08-27: 500 mL via INTRAVENOUS

## 2021-08-27 MED ORDER — NITROGLYCERIN 2 % TD OINT
1.0000 [in_us] | TOPICAL_OINTMENT | Freq: Once | TRANSDERMAL | Status: AC
  Administered 2021-08-27: 1 [in_us] via TOPICAL
  Filled 2021-08-27: qty 1

## 2021-08-27 MED ORDER — ASPIRIN 81 MG PO CHEW
324.0000 mg | CHEWABLE_TABLET | Freq: Once | ORAL | Status: AC
  Administered 2021-08-27: 324 mg via ORAL
  Filled 2021-08-27: qty 4

## 2021-08-27 MED ORDER — MAGNESIUM OXIDE -MG SUPPLEMENT 400 (240 MG) MG PO TABS
800.0000 mg | ORAL_TABLET | Freq: Once | ORAL | Status: AC
  Administered 2021-08-27: 800 mg via ORAL
  Filled 2021-08-27: qty 2

## 2021-08-27 NOTE — ED Notes (Signed)
Pt d/c home per MD order. Discharge summary reviewed, pt verbalizes understanding. Ambulatory off unit. No s/s of acute distress noted at discharge.  °

## 2021-08-27 NOTE — ED Notes (Signed)
Patient transported to CT 

## 2021-08-27 NOTE — ED Triage Notes (Signed)
Pt arrives to ED with c/o chest pain. This started yesterday. The pain is constant with intermittent episodes of sharpness. The pain is described as tightness. The pain is generalized and radiates to his back. Pt denies SOB. He is currently taking Carafate for gastritis. Associated symptoms include nausea.

## 2021-08-27 NOTE — ED Provider Notes (Addendum)
[facility] DEPT Provider Note   CSN: [id number] Arrival date & time: 08/27/21  1112     History Chief Complaint  Patient presents with   Chest Pain    Robert Schultz is a 55 y.o. male.   Chest Pain Associated symptoms: nausea   Associated symptoms: no abdominal pain, no back pain, no cough, no dizziness, no fatigue, no fever, no numbness, no palpitations, no shortness of breath, no vomiting and no weakness   Patient presents for chest pain since yesterday.  He is followed by [facility] (cardiologist Quay Burow).  The medical history notable for hypertension, palpitations.  Last cardiac catheterization was in 2004.  He underwent a negative stress test in December 2020.  Today, he reports chest pain since yesterday.  Onset was while at work.  Patient does work outside in does exert himself while working.  At onset, it was mild.  It has been mild and persistent since onset.  He has had some intermittent nausea.  He denies any current nausea.  He has a history of GERD and feels that his symptoms may be related to that.  He has had multiple stressors throughout the year.  Currently, he has a vacation planned with his wife for next week.  He presents to the ED to get checked out for any cardiac issues. HPI: A 55 year old patient with a history of treated diabetes, hypercholesterolemia and obesity presents for evaluation of chest pain. Initial onset of pain was less than one hour ago. The patient's chest pain is described as heaviness/pressure/tightness and is not worse with exertion. The patient complains of nausea. The patient's chest pain is middle- or left-sided, is not well-localized, is not sharp and does not radiate to the arms/jaw/neck. The patient denies diaphoresis. The patient has no history of stroke, has no history of peripheral artery disease, has not smoked in the past 90 days, has no relevant family history of coronary artery disease (first degree relative at  less than age 78) and is not hypertensive.   Past Medical History:  Diagnosis Date   AV block    Cervical radicular pain    Diverticulitis of colon    Gastritis    HTN (hypertension)    Hyperlipidemia    Morbid obesity ([place])    Normal coronary angiogram 2004   Palpitations    Radiculitis of leg    Sleep apnea    Thoracic spine pain     Patient Active Problem List   Diagnosis Date Noted   Elevated coronary artery calcium score 09/05/2020   Vestibulitis of ear, left 10/21/2019   Atypical chest pain 09/07/2019   Family history of heart disease 09/07/2019   Acute pancreatitis 09/16/2018   Pancreatitis 09/15/2018   Hepatic steatosis 09/15/2018   Obesity 09/15/2018   GERD (gastroesophageal reflux disease) 09/15/2018   CAP (community acquired pneumonia) 07/02/2014   Sepsis ([place]) 07/02/2014   DM (diabetes mellitus) ([place]) 08/26/2013   Numbness 08/26/2013   Cardiovascular risk factor 04/04/2011   HTN (hypertension) 03/08/2011   Heart palpitations 03/08/2011   Hyperlipidemia 03/08/2011   OBSTRUCTIVE SLEEP APNEA 05/19/2009   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 EF 65%       Family History  Problem Relation Age of Onset   Multiple sclerosis Mother    Hypertension Father    Cancer Father  Social History   Tobacco Use   Smoking status: Never   Smokeless tobacco: Never  Vaping Use   Vaping Use: Never used  Substance Use Topics   Alcohol use: No   Drug use: No    Home Medications Prior to Admission medications   Medication Sig Start Date End Date Taking? Authorizing Provider  metFORMIN (GLUCOPHAGE-XR) 500 MG 24 hr tablet Take 500 mg by mouth every evening.  12/12/16   [provider]  pantoprazole (PROTONIX) 40 MG tablet 1 TABLET ONCE A DAY TAKE 30 MINUTES PRIOR TO BREAKFAST.    [provider]   rosuvastatin (CRESTOR) 20 MG tablet TAKE 1 TABLET (20 MG TOTAL) BY MOUTH DAILY. KEEP UPCOMING APPOINTMENT 06/06/21   Pixie Casino, MD    Allergies    Hydrocodone, Penicillins, Acetaminophen-codeine, Atorvastatin, Codeine, Dihydrocodeine, and Hydrocodone-acetaminophen  Review of Systems   Review of Systems  Constitutional:  Negative for activity change, appetite change, chills, fatigue and fever.  HENT:  Negative for ear pain and sore throat.   Eyes:  Negative for pain and visual disturbance.  Respiratory:  Negative for cough and shortness of breath.   Cardiovascular:  Positive for chest pain. Negative for palpitations.  Gastrointestinal:  Positive for nausea. Negative for abdominal pain and vomiting.  Genitourinary:  Negative for dysuria and hematuria.  Musculoskeletal:  Negative for arthralgias and back pain.  Skin:  Negative for color change and rash.  Neurological:  Negative for dizziness, seizures, syncope, weakness, light-headedness and numbness.  All other systems reviewed and are negative.  Physical Exam Updated Vital Signs BP (!) 141/92   Pulse 68   Temp 97.7 F (36.5 C) (Oral)   Resp 17   Ht 5\' 8"  (1.727 m)   Wt 102.1 kg   SpO2 99%   BMI 34.21 kg/m   Physical Exam Vitals and nursing note reviewed.  Constitutional:      General: He is not in acute distress.    Appearance: He is well-developed and normal weight. He is not ill-appearing, toxic-appearing or diaphoretic.  HENT:     Head: Normocephalic and atraumatic.  Eyes:     Extraocular Movements: Extraocular movements intact.     Conjunctiva/sclera: Conjunctivae normal.  Neck:     Vascular: No JVD.  Cardiovascular:     Rate and Rhythm: Normal rate and regular rhythm.     Heart sounds: No murmur heard. Pulmonary:     Effort: Pulmonary effort is normal. No respiratory distress.     Breath sounds: Normal breath sounds. No decreased breath sounds, wheezing, rhonchi or rales.  Abdominal:     Palpations:  Abdomen is soft.     Tenderness: There is no abdominal tenderness.  Musculoskeletal:        General: No swelling. Normal range of motion.     Cervical back: Normal range of motion and neck supple.     Right lower leg: No edema.     Left lower leg: No edema.  Skin:    General: Skin is warm and dry.     Capillary Refill: Capillary refill takes less than 2 seconds.     Coloration: Skin is not pale.  Neurological:     General: No focal deficit present.     Mental Status: He is alert and oriented to person, place, and time.     Cranial Nerves: No cranial nerve deficit.     Motor: No weakness.  Psychiatric:        Mood and Affect: Mood  normal.        Behavior: Behavior normal.    ED Results / Procedures / Treatments   Labs (all labs ordered are listed, but only abnormal results are displayed) Labs Reviewed  BASIC METABOLIC PANEL - Abnormal; Notable for the following components:      Result Value   Glucose, Bld 156 (*)    All other components within normal limits  MAGNESIUM - Abnormal; Notable for the following components:   Magnesium 1.5 (*)    All other components within normal limits  CBC  TROPONIN I (HIGH SENSITIVITY)  TROPONIN I (HIGH SENSITIVITY)    EKG None  Radiology DG Chest 2 View  Result Date: 08/27/2021 CLINICAL DATA:  55 year old male with history of chest pain. EXAM: CHEST - 2 VIEW COMPARISON:  Chest x-ray 08/25/2020. FINDINGS: Elevation of the right hemidiaphragm is unchanged. Lung volumes are normal. No consolidative airspace disease. No pleural effusions. No pneumothorax. No pulmonary nodule or mass noted. Pulmonary vasculature and the cardiomediastinal silhouette are within normal limits. IMPRESSION: 1.  No radiographic evidence of acute cardiopulmonary disease. Electronically Signed   By: [name] M.D.   On: 08/27/2021 12:04   CT Angio Chest PE W and/or Wo Contrast  Result Date: 08/27/2021 CLINICAL DATA:  Chest pain and tightness. EXAM: CT  ANGIOGRAPHY CHEST WITH CONTRAST TECHNIQUE: Multidetector CT imaging of the chest was performed using the standard protocol during bolus administration of intravenous contrast. Multiplanar CT image reconstructions and MIPs were obtained to evaluate the vascular anatomy. CONTRAST:  52mL OMNIPAQUE IOHEXOL 350 MG/ML SOLN COMPARISON:  CT heart 09/22/2019 and CT chest 04/20/2018. FINDINGS: Cardiovascular: Negative for pulmonary embolus. Coronary artery calcification. Heart is enlarged. No pericardial effusion. Mediastinum/Nodes: No pathologically enlarged mediastinal, hilar or axillary lymph nodes. Esophagus is unremarkable. Lungs/Pleura: Image quality is degraded by expiratory phase imaging. 4 mm subpleural lymph node in the right lower lobe (6/69), unchanged. Lungs are otherwise clear. No pleural fluid. Airway is unremarkable. Upper Abdomen: Visualized portion of the liver is unremarkable. Stone in the gallbladder. Visualized portions of the adrenal glands, spleen, pancreas, stomach and bowel are unremarkable. Musculoskeletal: Degenerative changes in the spine. No worrisome lytic or sclerotic lesions. Review of the MIP images confirms the above findings. IMPRESSION: 1. Negative for pulmonary embolus. No acute findings to explain the patient's clinical history. 2. Coronary artery calcification. 3. Cholelithiasis. Electronically Signed   By: Lorin Picket M.D.   On: 08/27/2021 13:56    Procedures Procedures   Medications Ordered in ED Medications  aspirin chewable tablet 324 mg (324 mg Oral Given 08/27/21 1244)  nitroGLYCERIN (NITROGLYN) 2 % ointment 1 inch (1 inch Topical Given 08/27/21 1245)  lactated ringers bolus 500 mL (0 mLs Intravenous Stopped 08/27/21 1435)  iohexol (OMNIPAQUE) 350 MG/ML injection 75 mL (75 mLs Intravenous Contrast Given 08/27/21 1330)  magnesium oxide (MAG-OX) tablet 800 mg (800 mg Oral Given 08/27/21 1448)    ED Course  I have reviewed the triage vital signs and the nursing  notes.  Pertinent labs & imaging results that were available during my care of the patient were reviewed by me and considered in my medical decision making (see chart for details).    MDM Rules/Calculators/A&P HEAR Score: 4                        Patient presents for mild chest pain since yesterday EKG shows no evidence of ischemia.  Based on risk factors, symptoms, and EKG findings, patient is  a heart score of 4.  Initially, patient's blood pressure was elevated.  He states that his blood pressure is typically normal and he does not take any blood pressure medicines.  Per chart review, he has established care with cardiology for essential hypertension.  It does appear that this has been well controlled with lifestyle changes.  Patient does endorse history of whitecoat hypertension.  Patient was given 324 of ASA.  Nitroglycerin ointment was given.  Laboratory work-up was notable for hypomagnesemia.  Replacement was given orally.  Patient's initial troponin was 2.  He underwent a CTA of chest which is negative for PE or any other acute findings.  It did show some coronary calcifications.  Repeat troponin was also 2.   Patient would benefit from cardiology follow-up.  He was advised to contact [facility] for follow-up appointment.  Patient was also encouraged to return to the ED if he does have any worsening symptoms.  He was discharged in good condition.  Final Clinical Impression(s) / ED Diagnoses Final diagnoses:  Chest pain, unspecified type    Rx / DC Orders ED Discharge Orders     None        Godfrey Pick, MD 08/28/21 1546    Godfrey Pick, MD 08/28/21 1551

## 2021-08-30 DIAGNOSIS — M50121 Cervical disc disorder at C4-C5 level with radiculopathy: Secondary | ICD-10-CM | POA: Diagnosis not present

## 2021-08-30 DIAGNOSIS — M4722 Other spondylosis with radiculopathy, cervical region: Secondary | ICD-10-CM | POA: Diagnosis not present

## 2021-08-30 DIAGNOSIS — M4802 Spinal stenosis, cervical region: Secondary | ICD-10-CM | POA: Diagnosis not present

## 2021-08-30 DIAGNOSIS — R29890 Loss of height: Secondary | ICD-10-CM | POA: Diagnosis not present

## 2021-08-30 DIAGNOSIS — M4312 Spondylolisthesis, cervical region: Secondary | ICD-10-CM | POA: Diagnosis not present

## 2021-08-30 DIAGNOSIS — M542 Cervicalgia: Secondary | ICD-10-CM | POA: Diagnosis not present

## 2021-09-07 DIAGNOSIS — G4733 Obstructive sleep apnea (adult) (pediatric): Secondary | ICD-10-CM | POA: Diagnosis not present

## 2021-09-07 DIAGNOSIS — M47812 Spondylosis without myelopathy or radiculopathy, cervical region: Secondary | ICD-10-CM | POA: Diagnosis not present

## 2021-09-07 DIAGNOSIS — N529 Male erectile dysfunction, unspecified: Secondary | ICD-10-CM | POA: Diagnosis not present

## 2021-09-07 DIAGNOSIS — E785 Hyperlipidemia, unspecified: Secondary | ICD-10-CM | POA: Diagnosis not present

## 2021-09-07 DIAGNOSIS — E119 Type 2 diabetes mellitus without complications: Secondary | ICD-10-CM | POA: Diagnosis not present

## 2021-09-07 DIAGNOSIS — M50121 Cervical disc disorder at C4-C5 level with radiculopathy: Secondary | ICD-10-CM | POA: Diagnosis not present

## 2021-09-07 DIAGNOSIS — K219 Gastro-esophageal reflux disease without esophagitis: Secondary | ICD-10-CM | POA: Diagnosis not present

## 2021-09-07 DIAGNOSIS — Z7984 Long term (current) use of oral hypoglycemic drugs: Secondary | ICD-10-CM | POA: Diagnosis not present

## 2021-09-07 DIAGNOSIS — R079 Chest pain, unspecified: Secondary | ICD-10-CM | POA: Diagnosis not present

## 2021-09-08 DIAGNOSIS — N529 Male erectile dysfunction, unspecified: Secondary | ICD-10-CM | POA: Diagnosis not present

## 2021-09-08 DIAGNOSIS — M47812 Spondylosis without myelopathy or radiculopathy, cervical region: Secondary | ICD-10-CM | POA: Diagnosis not present

## 2021-09-08 DIAGNOSIS — R079 Chest pain, unspecified: Secondary | ICD-10-CM | POA: Diagnosis not present

## 2021-09-08 DIAGNOSIS — K219 Gastro-esophageal reflux disease without esophagitis: Secondary | ICD-10-CM | POA: Diagnosis not present

## 2021-09-08 DIAGNOSIS — M50121 Cervical disc disorder at C4-C5 level with radiculopathy: Secondary | ICD-10-CM | POA: Diagnosis not present

## 2021-09-08 DIAGNOSIS — E119 Type 2 diabetes mellitus without complications: Secondary | ICD-10-CM | POA: Diagnosis not present

## 2021-09-08 DIAGNOSIS — G4733 Obstructive sleep apnea (adult) (pediatric): Secondary | ICD-10-CM | POA: Diagnosis not present

## 2021-09-08 DIAGNOSIS — Z7984 Long term (current) use of oral hypoglycemic drugs: Secondary | ICD-10-CM | POA: Diagnosis not present

## 2021-09-08 DIAGNOSIS — E785 Hyperlipidemia, unspecified: Secondary | ICD-10-CM | POA: Diagnosis not present

## 2021-09-09 DIAGNOSIS — E119 Type 2 diabetes mellitus without complications: Secondary | ICD-10-CM | POA: Diagnosis not present

## 2021-09-09 DIAGNOSIS — N529 Male erectile dysfunction, unspecified: Secondary | ICD-10-CM | POA: Diagnosis not present

## 2021-09-09 DIAGNOSIS — E785 Hyperlipidemia, unspecified: Secondary | ICD-10-CM | POA: Diagnosis not present

## 2021-09-09 DIAGNOSIS — G4733 Obstructive sleep apnea (adult) (pediatric): Secondary | ICD-10-CM | POA: Diagnosis not present

## 2021-09-09 DIAGNOSIS — R079 Chest pain, unspecified: Secondary | ICD-10-CM | POA: Diagnosis not present

## 2021-09-09 DIAGNOSIS — Z7984 Long term (current) use of oral hypoglycemic drugs: Secondary | ICD-10-CM | POA: Diagnosis not present

## 2021-09-09 DIAGNOSIS — K219 Gastro-esophageal reflux disease without esophagitis: Secondary | ICD-10-CM | POA: Diagnosis not present

## 2021-09-09 DIAGNOSIS — M50121 Cervical disc disorder at C4-C5 level with radiculopathy: Secondary | ICD-10-CM | POA: Diagnosis not present

## 2021-09-10 DIAGNOSIS — R000 Tachycardia, unspecified: Secondary | ICD-10-CM | POA: Diagnosis not present

## 2021-09-18 ENCOUNTER — Telehealth: Payer: Self-pay | Admitting: Cardiovascular Disease

## 2021-09-18 NOTE — Telephone Encounter (Signed)
Patient c/o Palpitations:  High priority if patient c/o lightheadedness, shortness of breath, or chest pain  How long have you had palpitations/irregular HR/ Afib? Are you having the symptoms now?  Patient states for the past week he has been experiencing PVC's, but he is not having any symptoms currently  Are you currently experiencing lightheadedness, SOB or CP?  No   Do you have a history of afib (atrial fibrillation) or irregular heart rhythm?  No   Have you checked your BP or HR? (document readings if available):  Patient states his BP has been fluctuating and HR has been mildly elevated, but he does not have any readings  Are you experiencing any other symptoms?  No, but patient has chest pain during recent hospital admission.

## 2021-09-18 NOTE — Telephone Encounter (Signed)
Called patient back- he states that for the last week or so he has been having increased palpitations. He believes he is having PVC's- per last note from Coletta Memos, NP these were noted. He did recently have surgery on 12/16- notes in epic from care everywhere- herniated disk surgery. He does mention having chest discomfort during those episodes, they come even at rest, denies shortness of breath, swelling, dizziness or diet changes.  Patient states all he is taking currently for pain is tylenol- he did take one tramadol but not recently and no more since right after surgery because he did not like the way it made him feel. Patient does mention being anxious. He also states they did a recent echo on him at [facility], I did not see in care everywhere- but labs were in, troponin and mag, also recent cxr, and ct.  I advised I would route for further recommendations - the increased palpations could be coming from recent surgery and now pain, but would call back with anything MD/PA would recommend.   Patient verbalized understanding, thankful for call back.

## 2021-09-19 ENCOUNTER — Other Ambulatory Visit: Payer: Self-pay

## 2021-09-19 ENCOUNTER — Encounter (HOSPITAL_BASED_OUTPATIENT_CLINIC_OR_DEPARTMENT_OTHER): Payer: Self-pay

## 2021-09-19 ENCOUNTER — Other Ambulatory Visit: Payer: Self-pay | Admitting: Internal Medicine

## 2021-09-19 ENCOUNTER — Other Ambulatory Visit (HOSPITAL_COMMUNITY)
Admission: RE | Admit: 2021-09-19 | Discharge: 2021-09-19 | Disposition: A | Discharge status: Home / Self Care | Payer: BC Managed Care – PPO | Source: Ambulatory Visit | Attending: Emergency Medicine | Admitting: Emergency Medicine

## 2021-09-19 ENCOUNTER — Emergency Department (INDEPENDENT_AMBULATORY_CARE_PROVIDER_SITE_OTHER)
Admission: EM | Admit: 2021-09-19 | Discharge: 2021-09-19 | Disposition: A | Discharge status: Home / Self Care | Payer: BC Managed Care – PPO | Source: Home / Self Care | Attending: Emergency Medicine | Admitting: Emergency Medicine

## 2021-09-19 DIAGNOSIS — R109 Unspecified abdominal pain: Secondary | ICD-10-CM | POA: Insufficient documentation

## 2021-09-19 LAB — POCT URINALYSIS DIP (MANUAL ENTRY)
Bilirubin, UA: NEGATIVE
Blood, UA: NEGATIVE
Glucose, UA: NEGATIVE mg/dL
Ketones, POC UA: NEGATIVE mg/dL
Leukocytes, UA: NEGATIVE
Nitrite, UA: NEGATIVE
Protein Ur, POC: NEGATIVE mg/dL
Spec Grav, UA: 1.015 (ref 1.010–1.025)
Urobilinogen, UA: 0.2 E.U./dL
pH, UA: 7 (ref 5.0–8.0)

## 2021-09-19 MED ORDER — METOPROLOL SUCCINATE ER 25 MG PO TB24: 25.0000 mg | ORAL_TABLET | Freq: Every day | ORAL | 1 refills | Status: DC

## 2021-09-19 NOTE — Telephone Encounter (Signed)
Returned call to pt, informed him ot providers message he will keep a log until follow up appointment.

## 2021-09-19 NOTE — ED Triage Notes (Signed)
Pt presents to Urgent Care with c/o worsening suprapubic pain and urinary frequency x approx 10 days since being d/c'd from hospital following cervical diskectomy. Reports that he was having residual urine in bladder after urinating while in hospital and was referred to urology; appointment isn't until 10/19/21.

## 2021-09-19 NOTE — Discharge Instructions (Addendum)
Keep an eye on this.  Go immediately to the ER if you start having urinary incontinence, difficulty starting your stream, if your low abdominal pressure gets worse, if you are able to feel a palpable mass in your lower abdomen, fevers above 100.4, nausea, vomiting, new or different back pain, or any other concerns.

## 2021-09-19 NOTE — Telephone Encounter (Signed)
Called patient, advised of message from MD.  Called in RX to pharmacy.  Patient aware.  No questions/concerns.

## 2021-09-19 NOTE — ED Provider Notes (Signed)
HPI  SUBJECTIVE:  Robert Schultz is a 55 y.o. male who presents with nonmigratory, nonradiating low midline abdominal discomfort/pressure that is getting worse over the past week.  He reports occasionally odorous urine.  He states that he feels as if he is completely emptying his bladder when he urinates.  Denies dribbling, urinary incontinence, nausea, fevers, difficulty starting his urinary stream, dysuria, urgency, frequency, cloudy urine, abdominal distention.  No new or different back pain.  No saddle anesthesia, urinary or fecal incontinence.  He has not had symptoms like this before.  He has not tried anything for this.  Symptoms are worse with sitting.  He does not get any relief in the abdominal pressure when he urinates.  Car ride over here was not painful.  He does not have any pain with walking.  He had cervical surgery 2-1/2 weeks ago, did not have a Foley placed.  He was kept for several days because he had urinary retention.  States that he would urinate 450 cc at a time and still have a residual of 300 cc, but the PVRs was trending down prior to discharge.  Urology was consulted, and it was felt that he was stable for outpatient follow-up.  He has urology follow-up on 1/19.  He is not taking any narcotics.  He denies any new medications.  He has a past medical history of diabetes, hypertension, diverticulitis, PVCs.  No history of UTI, BPH.  States that he was sent in to make sure that he did not have a UTI.  PMD: [facility].  Urology: [facility]     Past Medical History:  Diagnosis Date   AV block    Cervical radicular pain    Diverticulitis of colon    Gastritis    HTN (hypertension)    Hyperlipidemia    Morbid obesity ([place])    Normal coronary angiogram 2004   Palpitations    Radiculitis of leg    Sleep apnea    Thoracic spine pain     Past Surgical History:  Procedure Laterality Date   CARDIAC CATHETERIZATION   11/17/2002   NORMAL. EF 65%   CERVICAL DISCECTOMY      Family History  Problem Relation Age of Onset   Multiple sclerosis Mother    Hypertension Father    Cancer Father     Social History   Tobacco Use   Smoking status: Never   Smokeless tobacco: Never  Vaping Use   Vaping Use: Never used  Substance Use Topics   Alcohol use: No   Drug use: No    No current facility-administered medications for this encounter.  Current Outpatient Medications:    acetaminophen (TYLENOL) 500 MG tablet, Take 500 mg by mouth every 6 (six) hours as needed., Disp: , Rfl:    glipiZIDE (GLUCOTROL) 5 MG tablet, Take by mouth., Disp: , Rfl:    metFORMIN (GLUCOPHAGE-XR) 500 MG 24 hr tablet, Take 500 mg by mouth every evening. , Disp: , Rfl: 0   metoprolol succinate (TOPROL XL) 25 MG 24 hr tablet, Take 1 tablet (25 mg total) by mouth daily., Disp: 90 tablet, Rfl: 1   pantoprazole (PROTONIX) 40 MG tablet, 1 TABLET ONCE A DAY TAKE 30 MINUTES PRIOR TO BREAKFAST., Disp: , Rfl:    rosuvastatin (CRESTOR) 20 MG tablet, TAKE 1 TABLET (20 MG TOTAL) BY MOUTH DAILY. KEEP UPCOMING APPOINTMENT, Disp: 90 tablet, Rfl: 1   sucralfate (CARAFATE) 1 g tablet,  Take 1 g by mouth 3 (three) times daily., Disp: , Rfl:   Facility-Administered Medications Ordered in Other Encounters:    feeding supplement (ENSURE PRE-SURGERY) liquid 296 mL, 296 mL, Oral, Once, Coralie Keens, MD  Allergies  Allergen Reactions   Hydrocodone Other (See Comments)    "I break into a violent sweat--fast."   Penicillins     Profuse sweating Swelling of the face/tongue/throat, SOB, or low BP? N Sudden or severe rash/hives, skin peeling, or the inside of the mouth or nose? n Did it require medical treatment? N When did it last happen?      unk If all above answers are NO, may proceed with cephalosporin use.    Acetaminophen-Codeine Other (See Comments)   Atorvastatin     Restlessness    Codeine    Dihydrocodeine     Hydrocodone-Acetaminophen Swelling     ROS  As noted in HPI.   Physical Exam  BP 137/88 (BP Location: Right Arm)    Pulse 91    Temp 98.7 F (37.1 C) (Oral)    Resp 20    Ht 5\' 8"  (1.727 m)    Wt 100.7 kg    SpO2 97%    BMI 33.75 kg/m   Constitutional: Well developed, well nourished, no acute distress Eyes:  EOMI, conjunctiva normal bilaterally HENT: Normocephalic, atraumatic,mucus membranes moist Respiratory: Normal inspiratory effort Cardiovascular: Normal rate GI: nondistended soft, nontender, no appreciable palpable abdominal masses. Back: No CVAT Rectal: Normal tone.  Firm, normal-sized prostate, no tenderness or bogginess. skin: No rash, skin intact Musculoskeletal: no deformities Neurologic: Alert & oriented x 3, no focal neuro deficits Psychiatric: Speech and behavior appropriate   ED Course   Medications - No data to display  Orders Placed This Encounter  Procedures   Urine Culture    Standing Status:   Standing    Number of Occurrences:   1    Order Specific Question:   Indication    Answer:   Suprapubic pain   POCT urinalysis dipstick    Standing Status:   Standing    Number of Occurrences:   1    Results for orders placed or performed during the hospital encounter of 09/19/21 (from the past 24 hour(s))  POCT urinalysis dipstick     Status: None   Collection Time: 09/19/21  8:25 PM  Result Value Ref Range   Color, UA yellow yellow   Clarity, UA clear clear   Glucose, UA negative negative mg/dL   Bilirubin, UA negative negative   Ketones, POC UA negative negative mg/dL   Spec Grav, UA 1.015 1.010 - 1.025   Blood, UA negative negative   pH, UA 7.0 5.0 - 8.0   Protein Ur, POC negative negative mg/dL   Urobilinogen, UA 0.2 0.2 or 1.0 E.U./dL   Nitrite, UA Negative Negative   Leukocytes, UA Negative Negative   No results found.  ED Clinical Impression  1. Abdominal pressure      ED Assessment/Plan  No Foley, straight cath, ultrasound,  bladder ultrasound available here.  He was sent in to make sure he did not have a UTI, and he does not have a UTI.   Urine culture sent.  He may have urinary retention, but I am unable to make that diagnosis with the absence of available materials.  I am unable to appreciate a palpable bladder. he reports low abdominal discomfort, not pain.  He has no abdominal, flank, or CVA tenderness.  Will have patient keep  an eye on this.  Strict ER return precautions given.  He has follow-up with urology on 1/19.  Discussed labs, MDM, treatment plan, and plan for follow-up with patient. Discussed sn/sx that should prompt return to the ED. patient agrees with plan.   No orders of the defined types were placed in this encounter.     *This clinic note was created using Dragon dictation software. Therefore, there may be occasional mistakes despite careful proofreading.  ? Melynda Ripple, MD 09/20/21 [phone]

## 2021-09-19 NOTE — Telephone Encounter (Signed)
Please contact patient and advise that he increase his p.o. hydration.  Please ask him to also avoid caffeine, chocolate, and alcohol.  Please verify that he continues to use his CPAP.  He previously also was running 3 businesses.  Please offer him the mindfulness stress reduction sheet.  Please schedule him for routine cardiology follow-up.  Thank you.  Jossie Ng. Turrell Severt NP-C    09/19/2021, 7:19 AM [place] [address] [place] [address] Office [phone] Fax [phone]

## 2021-09-20 NOTE — Telephone Encounter (Signed)
My chart message sent to pt.

## 2021-09-21 LAB — URINE CULTURE

## 2021-09-26 ENCOUNTER — Emergency Department (HOSPITAL_BASED_OUTPATIENT_CLINIC_OR_DEPARTMENT_OTHER): Payer: BC Managed Care – PPO

## 2021-09-26 ENCOUNTER — Emergency Department (HOSPITAL_BASED_OUTPATIENT_CLINIC_OR_DEPARTMENT_OTHER)
Admission: EM | Admit: 2021-09-26 | Discharge: 2021-09-26 | Disposition: A | Discharge status: Home / Self Care | Payer: BC Managed Care – PPO | Attending: Emergency Medicine | Admitting: Emergency Medicine

## 2021-09-26 ENCOUNTER — Other Ambulatory Visit: Payer: Self-pay

## 2021-09-26 ENCOUNTER — Encounter (HOSPITAL_BASED_OUTPATIENT_CLINIC_OR_DEPARTMENT_OTHER): Payer: Self-pay

## 2021-09-26 ENCOUNTER — Other Ambulatory Visit: Payer: Self-pay | Admitting: Surgery

## 2021-09-26 DIAGNOSIS — R1031 Right lower quadrant pain: Secondary | ICD-10-CM | POA: Diagnosis not present

## 2021-09-26 DIAGNOSIS — N50819 Testicular pain, unspecified: Secondary | ICD-10-CM | POA: Diagnosis not present

## 2021-09-26 DIAGNOSIS — R109 Unspecified abdominal pain: Secondary | ICD-10-CM | POA: Diagnosis not present

## 2021-09-26 DIAGNOSIS — R1030 Lower abdominal pain, unspecified: Secondary | ICD-10-CM

## 2021-09-26 DIAGNOSIS — K8020 Calculus of gallbladder without cholecystitis without obstruction: Secondary | ICD-10-CM

## 2021-09-26 LAB — URINALYSIS, ROUTINE W REFLEX MICROSCOPIC
Bilirubin Urine: NEGATIVE
Glucose, UA: NEGATIVE mg/dL
Hgb urine dipstick: NEGATIVE
Ketones, ur: NEGATIVE mg/dL
Leukocytes,Ua: NEGATIVE
Nitrite: NEGATIVE
Protein, ur: NEGATIVE mg/dL
Specific Gravity, Urine: 1.015 (ref 1.005–1.030)
pH: 5 (ref 5.0–8.0)

## 2021-09-26 LAB — COMPREHENSIVE METABOLIC PANEL
ALT: 23 U/L (ref 0–44)
AST: 17 U/L (ref 15–41)
Albumin: 4.6 g/dL (ref 3.5–5.0)
Alkaline Phosphatase: 56 U/L (ref 38–126)
Anion gap: 9 (ref 5–15)
BUN: 10 mg/dL (ref 6–20)
CO2: 28 mmol/L (ref 22–32)
Calcium: 9.5 mg/dL (ref 8.9–10.3)
Chloride: 102 mmol/L (ref 98–111)
Creatinine, Ser: 0.82 mg/dL (ref 0.61–1.24)
GFR, Estimated: >60 mL/min (ref >60)
Glucose, Bld: 165 mg/dL — ABNORMAL HIGH (ref 70–99)
Potassium: 4.1 mmol/L (ref 3.5–5.1)
Sodium: 139 mmol/L (ref 135–145)
Total Bilirubin: 0.4 mg/dL (ref 0.3–1.2)
Total Protein: 7.5 g/dL (ref 6.5–8.1)

## 2021-09-26 LAB — CBC
HCT: 43.8 % (ref 39.0–52.0)
Hemoglobin: 14.3 g/dL (ref 13.0–17.0)
MCH: 31.8 pg (ref 26.0–34.0)
MCHC: 32.6 g/dL (ref 30.0–36.0)
MCV: 97.3 fL (ref 80.0–100.0)
Platelets: 279 10*3/uL (ref 150–400)
RBC: 4.5 MIL/uL (ref 4.22–5.81)
RDW: 13.4 % (ref 11.5–15.5)
WBC: 10 10*3/uL (ref 4.0–10.5)
nRBC: 0 % (ref 0.0–0.2)

## 2021-09-26 LAB — LIPASE, BLOOD: Lipase: 27 U/L (ref 11–51)

## 2021-09-26 MED ORDER — ONDANSETRON HCL 4 MG/2ML IJ SOLN
4.0000 mg | Freq: Once | INTRAMUSCULAR | Status: AC
  Administered 2021-09-26: 4 mg via INTRAVENOUS
  Filled 2021-09-26: qty 2

## 2021-09-26 MED ORDER — LACTATED RINGERS IV BOLUS
500.0000 mL | Freq: Once | INTRAVENOUS | Status: AC
  Administered 2021-09-26: 500 mL via INTRAVENOUS

## 2021-09-26 MED ORDER — IOHEXOL 300 MG/ML  SOLN
100.0000 mL | Freq: Once | INTRAMUSCULAR | Status: AC | PRN
  Administered 2021-09-26: 100 mL via INTRAVENOUS

## 2021-09-26 NOTE — ED Provider Notes (Signed)
[facility] EMERGENCY DEPT Provider Note   CSN: [id number] Arrival date & time: 09/26/21  1139     History  No chief complaint on file.   Robert Schultz is a 56 y.o. male.  HPI  56 year old male presented to the emergency department the chief complaint of abdominal pain.  The patient states that he has had diffuse abdominal pain, flank pain on the right and right lower quadrant pain for the past 2 weeks.  Last night he states that he had tenderness to the right lower quadrant area.  He additionally endorses testicular pain bilaterally that is been intermittent for the past few weeks.  He denies any dysuria, hematuria, increased urinary frequency.  No fevers or chills.  No nausea, vomiting.  He states he has a history of cholelithiasis.  Home Medications Prior to Admission medications   Medication Sig Start Date End Date Taking? Authorizing Provider  acetaminophen (TYLENOL) 500 MG tablet Take 500 mg by mouth every 6 (six) hours as needed.    [provider]  glipiZIDE (GLUCOTROL) 5 MG tablet Take by mouth.    [provider]  metFORMIN (GLUCOPHAGE-XR) 500 MG 24 hr tablet Take 500 mg by mouth every evening.  12/12/16   [provider]  metoprolol succinate (TOPROL XL) 25 MG 24 hr tablet Take 1 tablet (25 mg total) by mouth daily. 09/19/21   Lorretta Harp, MD  pantoprazole (PROTONIX) 40 MG tablet 1 TABLET ONCE A DAY TAKE 30 MINUTES PRIOR TO BREAKFAST.    [provider]  rosuvastatin (CRESTOR) 20 MG tablet TAKE 1 TABLET (20 MG TOTAL) BY MOUTH DAILY. KEEP UPCOMING APPOINTMENT 06/06/21   Pixie Casino, MD  sucralfate (CARAFATE) 1 g tablet Take 1 g by mouth 3 (three) times daily. 09/17/21   [provider]      Allergies    Hydrocodone, Penicillins, Acetaminophen-codeine, Atorvastatin, Codeine, Dihydrocodeine, and Hydrocodone-acetaminophen    Review of Systems   Review of Systems  Physical Exam Updated Vital Signs BP (!)  194/99    Pulse 90    Temp (!) 97.1 F (36.2 C)    Resp 15    Ht 5\' 8"  (1.727 m)    Wt 100.2 kg    SpO2 100%    BMI 33.60 kg/m  Physical Exam Vitals and nursing note reviewed. Exam conducted with a chaperone present.  Constitutional:      General: He is not in acute distress.    Appearance: He is well-developed.  HENT:     Head: Normocephalic and atraumatic.  Eyes:     Conjunctiva/sclera: Conjunctivae normal.  Cardiovascular:     Rate and Rhythm: Normal rate and regular rhythm.     Heart sounds: No murmur heard. Pulmonary:     Effort: Pulmonary effort is normal. No respiratory distress.     Breath sounds: Normal breath sounds.  Abdominal:     Palpations: Abdomen is soft.     Tenderness: There is abdominal tenderness in the right lower quadrant. Negative signs include Murphy's sign.     Hernia: There is no hernia in the left inguinal area or right inguinal area.  Genitourinary:    Testes: Normal. Cremasteric reflex is present.     Epididymis:     Right: Normal.     Left: Normal.     Comments: No testicular swelling or tenderness to palpation.  No epididymis tenderness.  No hernias palpated. Musculoskeletal:        General: No swelling.  Cervical back: Neck supple.  Skin:    General: Skin is warm and dry.     Capillary Refill: Capillary refill takes less than 2 seconds.  Neurological:     Mental Status: He is alert.  Psychiatric:        Mood and Affect: Mood normal.    ED Results / Procedures / Treatments   Labs (all labs ordered are listed, but only abnormal results are displayed) Labs Reviewed  COMPREHENSIVE METABOLIC PANEL - Abnormal; Notable for the following components:      Result Value   Glucose, Bld 165 (*)    All other components within normal limits  LIPASE, BLOOD  CBC  URINALYSIS, ROUTINE W REFLEX MICROSCOPIC    EKG None  Radiology No results found.  Procedures Procedures    Medications Ordered in ED Medications - No data to display  ED  Course/ Medical Decision Making/ A&P Clinical Course as of 09/26/21 2111  Wed Sep 26, 2021  1724 CT scan neg for acute process. Shows a known gall stone, unchanged renal cyst. Patient reassured no emergent condition identified. Encouraged follow up with his surgery to discuss cholecystectomy. RTED for persistent pain, vomiting, fever or other concerns.  [CS]    Clinical Course User Index [CS] Truddie Hidden, MD                           Medical Decision Making   56 year old male presented to the emergency department the chief complaint of abdominal pain.  The patient states that he has had diffuse abdominal pain, flank pain on the right and right lower quadrant pain for the past 2 weeks.  Last night he states that he had tenderness to the right lower quadrant area.  He additionally endorses testicular pain bilaterally that is been intermittent for the past few weeks.  He denies any dysuria, hematuria, increased urinary frequency.  No fevers or chills.  No nausea, vomiting.  He states he has a history of cholelithiasis.  Pertinent exam findings include: No inguinal hernia palpated, no testicular pain or tenderness, normal cremasteric reflex, mild right lower quadrant tenderness palpation on exam.   With presenting history and physical exam, doubt Bowel Obstruction, AAA, ACS, pneumonia, pneumothorax, Pyelonephritis, Nephrolithiasis, Pancreatitis, Cholecystitis, Shingles, Perforated Bowel or Ulcer, Diverticulosis/itis, Ischemic Mesentery, Inflammatory Bowel Disease, Strangulated/Incarcerated Hernia, gastritis, PUD. Patient without peritoneal signs or other indication of need for surgical intervention.  Lab results include: Urinalysis negative.  CMP generally unremarkable, mild hyperglycemia 165, lipase normal, CBC unremarkable.  Imaging results include: CT abdomen pelvis was pending at time of signout.  Course of tx has consisted of: Patient administered IV Zofran, IV fluid bolus.  Plan at  time of signout to follow-up the patient's CT imaging and reassess with a likely plan for discharge home pending negative results.  Overall work-up so far reassuring.  Lower concern for acute abnormality in the abdomen or genitourinary tract requiring immediate intervention.  Signout given to Dr. Karle Starch at 1500.    Final Clinical Impression(s) / ED Diagnoses Final diagnoses:  None    Rx / DC Orders ED Discharge Orders     None         Regan Lemming, MD 09/26/21 2148

## 2021-09-26 NOTE — ED Triage Notes (Signed)
Pt c/o diffuse abd pain, flank pain x 2 weeks. Pt states last night he had tenderness to the RLQ area. Pt also reports he has a bad gallbladder.

## 2021-09-26 NOTE — ED Provider Notes (Signed)
Care of the patient assumed at the change of shift. Here for abdominal pain, pending CT. Labs are unremarkable.  Physical Exam  BP (!) 161/112 (BP Location: Right Arm)    Pulse 89    Temp (!) 97.1 F (36.2 C)    Resp 18    Ht 5\' 8"  (1.727 m)    Wt 100.2 kg    SpO2 99%    BMI 33.60 kg/m   Physical Exam  Procedures  Procedures  ED Course / MDM   Clinical Course as of 09/26/21 1726  Wed Sep 26, 2021  1724 CT scan neg for acute process. Shows a known gall stone, unchanged renal cyst. Patient reassured no emergent condition identified. Encouraged follow up with his surgery to discuss cholecystectomy. RTED for persistent pain, vomiting, fever or other concerns.  [CS]    Clinical Course User Index [CS] Truddie Hidden, MD   Medical Decision Making Problems Addressed: Calculus of gallbladder without cholecystitis without obstruction: chronic illness or injury Lower abdominal pain: acute illness or injury  Amount and/or Complexity of Data Reviewed Labs:  Decision-making details documented in ED Course. Radiology: independent interpretation performed. Decision-making details documented in ED Course.  Risk OTC drugs.          Truddie Hidden, MD 09/26/21 [id number]

## 2021-10-02 DIAGNOSIS — R109 Unspecified abdominal pain: Secondary | ICD-10-CM | POA: Diagnosis not present

## 2021-10-02 DIAGNOSIS — K589 Irritable bowel syndrome without diarrhea: Secondary | ICD-10-CM | POA: Diagnosis not present

## 2021-10-02 DIAGNOSIS — K639 Disease of intestine, unspecified: Secondary | ICD-10-CM | POA: Diagnosis not present

## 2021-10-02 DIAGNOSIS — Z8719 Personal history of other diseases of the digestive system: Secondary | ICD-10-CM | POA: Diagnosis not present

## 2021-10-03 DIAGNOSIS — K639 Disease of intestine, unspecified: Secondary | ICD-10-CM | POA: Diagnosis not present

## 2021-10-03 DIAGNOSIS — Z8719 Personal history of other diseases of the digestive system: Secondary | ICD-10-CM | POA: Diagnosis not present

## 2021-10-09 DIAGNOSIS — R079 Chest pain, unspecified: Secondary | ICD-10-CM | POA: Diagnosis not present

## 2021-10-09 DIAGNOSIS — E1165 Type 2 diabetes mellitus with hyperglycemia: Secondary | ICD-10-CM | POA: Diagnosis not present

## 2021-10-09 DIAGNOSIS — M549 Dorsalgia, unspecified: Secondary | ICD-10-CM | POA: Diagnosis not present

## 2021-10-09 DIAGNOSIS — R109 Unspecified abdominal pain: Secondary | ICD-10-CM | POA: Diagnosis not present

## 2021-10-11 DIAGNOSIS — G8929 Other chronic pain: Secondary | ICD-10-CM | POA: Diagnosis not present

## 2021-10-11 DIAGNOSIS — M545 Low back pain, unspecified: Secondary | ICD-10-CM | POA: Diagnosis not present

## 2021-10-11 DIAGNOSIS — Z9889 Other specified postprocedural states: Secondary | ICD-10-CM | POA: Diagnosis not present

## 2021-10-11 DIAGNOSIS — M47817 Spondylosis without myelopathy or radiculopathy, lumbosacral region: Secondary | ICD-10-CM | POA: Diagnosis not present

## 2021-10-11 DIAGNOSIS — M47816 Spondylosis without myelopathy or radiculopathy, lumbar region: Secondary | ICD-10-CM | POA: Diagnosis not present

## 2021-10-11 DIAGNOSIS — M5137 Other intervertebral disc degeneration, lumbosacral region: Secondary | ICD-10-CM | POA: Diagnosis not present

## 2021-10-11 DIAGNOSIS — M5136 Other intervertebral disc degeneration, lumbar region: Secondary | ICD-10-CM | POA: Diagnosis not present

## 2021-10-11 DIAGNOSIS — M47812 Spondylosis without myelopathy or radiculopathy, cervical region: Secondary | ICD-10-CM | POA: Diagnosis not present

## 2021-10-11 DIAGNOSIS — R109 Unspecified abdominal pain: Secondary | ICD-10-CM | POA: Diagnosis not present

## 2021-10-11 DIAGNOSIS — M5134 Other intervertebral disc degeneration, thoracic region: Secondary | ICD-10-CM | POA: Diagnosis not present

## 2021-10-11 DIAGNOSIS — M4316 Spondylolisthesis, lumbar region: Secondary | ICD-10-CM | POA: Diagnosis not present
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
## 2021-10-16 DIAGNOSIS — E538 Deficiency of other specified B group vitamins: Secondary | ICD-10-CM | POA: Diagnosis not present

## 2021-10-17 ENCOUNTER — Encounter (HOSPITAL_BASED_OUTPATIENT_CLINIC_OR_DEPARTMENT_OTHER): Payer: Self-pay | Admitting: General Practice

## 2021-10-17 ENCOUNTER — Other Ambulatory Visit: Payer: Self-pay

## 2021-10-17 ENCOUNTER — Ambulatory Visit (INDEPENDENT_AMBULATORY_CARE_PROVIDER_SITE_OTHER): Payer: BC Managed Care – PPO | Admitting: General Practice

## 2021-10-17 VITALS — BP 110/78 | HR 68 | Ht 68.0 in | Wt 225.0 lb

## 2021-10-17 DIAGNOSIS — I10 Essential (primary) hypertension: Secondary | ICD-10-CM

## 2021-10-17 DIAGNOSIS — R0789 Other chest pain: Secondary | ICD-10-CM | POA: Diagnosis not present

## 2021-10-17 DIAGNOSIS — R931 Abnormal findings on diagnostic imaging of heart and coronary circulation: Secondary | ICD-10-CM

## 2021-10-17 DIAGNOSIS — E782 Mixed hyperlipidemia: Secondary | ICD-10-CM | POA: Diagnosis not present

## 2021-10-17 DIAGNOSIS — R002 Palpitations: Secondary | ICD-10-CM | POA: Diagnosis not present

## 2021-10-17 MED ORDER — METOPROLOL TARTRATE 100 MG PO TABS: 100.0000 mg | ORAL_TABLET | Freq: Once | ORAL | 0 refills | Status: DC

## 2021-10-17 NOTE — Patient Instructions (Signed)
Medication Instructions:  Your Physician recommend you continue on your current medication as directed.    *If you need a refill on your cardiac medications before your next appointment, please call your pharmacy*   Lab Work: None ordered today   If you have labs (blood work) drawn today and your tests are completely normal, you will receive your results only by: [place] (if you have MyChart) OR A paper copy in the mail If you have any lab test that is abnormal or we need to change your treatment, we will call you to review the results.   Testing/Procedures:   Your cardiac CT will be scheduled at one of the below locations:   [facility] [address] [place], [place] [address] [phone]  If scheduled at [facility], please arrive at the [facility] main entrance (entrance A) of [facility] 30 minutes prior to test start time. You can use the FREE valet parking offered at the main entrance (encouraged to control the heart rate for the test) Proceed to the [facility] Radiology Department (first floor) to check-in and test prep.   Please follow these instructions carefully (unless otherwise directed):  Hold all erectile dysfunction medications at least 3 days (72 hrs) prior to test.  On the Night Before the Test: Be sure to Drink plenty of water. Do not consume any caffeinated/decaffeinated beverages or chocolate 12 hours prior to your test. Do not take any antihistamines 12 hours prior to your test.  On the Day of the Test: Drink plenty of water until 1 hour prior to the test. Do not eat any food 4 hours prior to the test. You may take your regular medications prior to the test.  Take metoprolol (Lopressor) two hours prior to test.       After the Test: Drink plenty of water. After receiving IV contrast, you may experience a mild flushed feeling. This is normal. On occasion, you may experience a mild rash up to 24 hours after  the test. This is not dangerous. If this occurs, you can take Benadryl 25 mg and increase your fluid intake. If you experience trouble breathing, this can be serious. If it is severe call 911 IMMEDIATELY. If it is mild, please call our office. If you take any of these medications: Glipizide/Metformin, Avandament, Glucavance, please do not take 48 hours after completing test unless otherwise instructed.  We will call to schedule your test 2-4 weeks out understanding that some insurance companies will need an authorization prior to the service being performed.   For non-scheduling related questions, please contact the cardiac imaging nurse navigator should you have any questions/concerns: Marchia Bond, Cardiac Imaging Nurse Navigator Gordy Clement, Cardiac Imaging Nurse Navigator [facility] Direct Office Dial: [phone]   For scheduling needs, including cancellations and rescheduling, please call [place], [phone].    Follow-Up: At [facility], you and your health needs are our priority.  As part of our continuing mission to provide you with exceptional heart care, we have created designated Provider Care Teams.  These Care Teams include your primary Cardiologist (physician) and Advanced Practice Providers (APPs -  Physician Assistants and Nurse Practitioners) who all work together to provide you with the care you need, when you need it.  We recommend signing up for the patient portal called "MyChart".  Sign up information is provided on this After Visit Summary.  MyChart is used to connect with patients for Virtual Visits (Telemedicine).  Patients are able to view lab/test results, encounter notes, upcoming appointments, etc.  Non-urgent messages can be sent to your provider as well.   To learn more about what you can do with MyChart, go to [email or web address].    Your next appointment:   9 month(s)  The format for your next appointment:   In  Person  Provider:   Quay Burow, MD  or Coletta Memos, NP

## 2021-10-22 ENCOUNTER — Telehealth (HOSPITAL_COMMUNITY): Payer: Self-pay | Admitting: Emergency Medicine

## 2021-10-22 DIAGNOSIS — M436 Torticollis: Secondary | ICD-10-CM | POA: Diagnosis not present

## 2021-10-22 DIAGNOSIS — Z7409 Other reduced mobility: Secondary | ICD-10-CM | POA: Diagnosis not present

## 2021-10-22 DIAGNOSIS — M47812 Spondylosis without myelopathy or radiculopathy, cervical region: Secondary | ICD-10-CM | POA: Diagnosis not present

## 2021-10-22 DIAGNOSIS — M2569 Stiffness of other specified joint, not elsewhere classified: Secondary | ICD-10-CM | POA: Diagnosis not present

## 2021-10-22 NOTE — Telephone Encounter (Signed)
Reaching out to patient to offer assistance regarding upcoming cardiac imaging study; pt verbalizes understanding of appt date/time, parking situation and where to check in, pre-test NPO status and medications ordered, and verified current allergies; name and call back number provided for further questions should they arise Marchia Bond RN Navigator Cardiac Imaging [facility] [phone] office [phone] cell  Denies iv issues 100mg  metoprolol tartrate Arrival 730a Holding sildenafil

## 2021-10-22 NOTE — Telephone Encounter (Signed)
Attempted to call patient regarding upcoming cardiac CT appointment. °Left message on voicemail with name and callback number °Bethann Qualley RN Navigator Cardiac Imaging °[facility] °[phone] Office °[phone] Cell ° °

## 2021-10-23 DIAGNOSIS — E538 Deficiency of other specified B group vitamins: Secondary | ICD-10-CM | POA: Diagnosis not present

## 2021-10-24 ENCOUNTER — Other Ambulatory Visit: Payer: Self-pay

## 2021-10-24 ENCOUNTER — Ambulatory Visit (HOSPITAL_COMMUNITY)
Admission: RE | Admit: 2021-10-24 | Discharge: 2021-10-24 | Disposition: A | Discharge status: Home / Self Care | Payer: BC Managed Care – PPO | Source: Ambulatory Visit | Attending: General Practice | Admitting: General Practice

## 2021-10-24 DIAGNOSIS — R0789 Other chest pain: Secondary | ICD-10-CM | POA: Insufficient documentation

## 2021-10-24 DIAGNOSIS — I2510 Atherosclerotic heart disease of native coronary artery without angina pectoris: Secondary | ICD-10-CM | POA: Diagnosis not present

## 2021-10-24 MED ORDER — DILTIAZEM HCL 25 MG/5ML IV SOLN
10.0000 mg | INTRAVENOUS | Status: AC | PRN
  Administered 2021-10-24: 10 mg via INTRAVENOUS

## 2021-10-24 MED ORDER — SODIUM CHLORIDE 0.9 % IV BOLUS
250.0000 mL | Freq: Once | INTRAVENOUS | Status: AC
  Administered 2021-10-24: 250 mL via INTRAVENOUS

## 2021-10-24 MED ORDER — DILTIAZEM HCL 25 MG/5ML IV SOLN
INTRAVENOUS | Status: AC
  Administered 2021-10-24: 10 mg via INTRAVENOUS
  Filled 2021-10-24: qty 5

## 2021-10-24 MED ORDER — NITROGLYCERIN 0.4 MG SL SUBL
SUBLINGUAL_TABLET | SUBLINGUAL | Status: AC
  Filled 2021-10-24: qty 2

## 2021-10-24 MED ORDER — METOPROLOL TARTRATE 5 MG/5ML IV SOLN: 5.0000 mg | INTRAVENOUS | Status: DC | PRN

## 2021-10-24 MED ORDER — NITROGLYCERIN 0.4 MG SL SUBL
0.8000 mg | SUBLINGUAL_TABLET | Freq: Once | SUBLINGUAL | Status: AC
  Administered 2021-10-24: 0.8 mg via SUBLINGUAL

## 2021-10-24 MED ORDER — METOPROLOL TARTRATE 5 MG/5ML IV SOLN
INTRAVENOUS | Status: AC
  Administered 2021-10-24: 5 mg via INTRAVENOUS
  Filled 2021-10-24: qty 15

## 2021-10-24 MED ORDER — IOHEXOL 350 MG/ML SOLN
95.0000 mL | Freq: Once | INTRAVENOUS | Status: AC | PRN
  Administered 2021-10-24: 95 mL via INTRAVENOUS

## 2021-10-24 NOTE — Progress Notes (Signed)
Took 100 mg of Metoprolol at home. Heart rate here 78 initially. Given Diltiazem 20 mg and  Metoprolol 5mg  IV and heart rate down to 70. BP 118 82 prior to 2 Nitro given. Post test, sitting up, BP 88 54 and patient light headed, laid down on CT table and feels better, BP 101 63. Given water to drink. BP 92/60 sitting and taken to nurses station, is only slightly light headed. Given water to drink and 2 x 250cc bolus of NS.  SBP still in 80's when standing but patient feels fine. Left a message for Dr. Aundra Dubin to see how to proceed. At 0952 BP 90/60. Patient asymptomatic, not light headed and color pink, skin warm and dry. Patient states he feels fine to go home. Discharged walking with Horald Pollen RN who will wald with him to the main entrance. Discharged with a cup of water.

## 2021-10-26 ENCOUNTER — Ambulatory Visit (INDEPENDENT_AMBULATORY_CARE_PROVIDER_SITE_OTHER): Payer: BC Managed Care – PPO | Admitting: Cardiovascular Disease

## 2021-10-26 ENCOUNTER — Encounter: Payer: Self-pay | Admitting: Cardiovascular Disease

## 2021-10-26 ENCOUNTER — Other Ambulatory Visit: Payer: Self-pay

## 2021-10-26 DIAGNOSIS — Z8249 Family history of ischemic heart disease and other diseases of the circulatory system: Secondary | ICD-10-CM

## 2021-10-26 DIAGNOSIS — E782 Mixed hyperlipidemia: Secondary | ICD-10-CM | POA: Diagnosis not present

## 2021-10-26 DIAGNOSIS — R931 Abnormal findings on diagnostic imaging of heart and coronary circulation: Secondary | ICD-10-CM

## 2021-10-26 DIAGNOSIS — I10 Essential (primary) hypertension: Secondary | ICD-10-CM | POA: Diagnosis not present

## 2021-10-26 MED ORDER — SODIUM CHLORIDE 0.9% FLUSH: 3.0000 mL | Freq: Two times a day (BID) | INTRAVENOUS | Status: AC

## 2021-10-26 NOTE — Assessment & Plan Note (Signed)
History of essential hypertension a blood pressure measured today at 120/82.  He is on metoprolol.

## 2021-10-26 NOTE — Assessment & Plan Note (Signed)
History of hyperlipidemia on rosuvastatin with lipid profile performed 04/04/2021 revealing total cholesterol 120, LDL 66 and HDL of 46.

## 2021-10-26 NOTE — Assessment & Plan Note (Signed)
Elevated coronary calcium score of 533 performed 09/22/2019.  He has been getting frequent chest pain recently which has been attributed to a GI etiology.  He had a coronary calcium score and CTA revealing a coronary calcium score 1059 with three-vessel disease.  Based on this, we have decided to proceed with outpatient diagnostic coronary angiography via the right radial approach. I have reviewed the risks, indications, and alternatives to cardiac catheterization, possible angioplasty, and stenting with the patient. Risks include but are not limited to bleeding, infection, vascular injury, stroke, myocardial infection, arrhythmia, kidney injury, radiation-related injury in the case of prolonged fluoroscopy use, emergency cardiac surgery, and death. The patient understands the risks of serious complication is 1-2 in 9198 with diagnostic cardiac cath and 1-2% or less with angioplasty/stenting.

## 2021-10-26 NOTE — Patient Instructions (Addendum)
Medication Instructions:  Your physician recommends that you continue on your current medications as directed. Please refer to the Current Medication list given to you today.  *If you need a refill on your cardiac medications before your next appointment, please call your pharmacy*   Lab Work: Your physician recommends that you have labs drawn today: BMET and CBC  If you have labs (blood work) drawn today and your tests are completely normal, you will receive your results only by: [place] (if you have MyChart) OR A paper copy in the mail If you have any lab test that is abnormal or we need to change your treatment, we will call you to review the results.   Testing/Procedures: See below   Follow-Up: At [facility], you and your health needs are our priority.  As part of our continuing mission to provide you with exceptional heart care, we have created designated Provider Care Teams.  These Care Teams include your primary Cardiologist (physician) and Advanced Practice Providers (APPs -  Physician Assistants and Nurse Practitioners) who all work together to provide you with the care you need, when you need it.  We recommend signing up for the patient portal called "MyChart".  Sign up information is provided on this After Visit Summary.  MyChart is used to connect with patients for Virtual Visits (Telemedicine).  Patients are able to view lab/test results, encounter notes, upcoming appointments, etc.  Non-urgent messages can be sent to your provider as well.   To learn more about what you can do with MyChart, go to [email or web address].    Your next appointment:   2-3 week(s)  The format for your next appointment:   In Person  Provider:   Quay Burow, MD    Other Instructions  [place] [address] Dept: [phone] Loc: [place]  10/26/2021  You are scheduled for a Cardiac Catheterization on Monday, February 6 with Dr. Quay Burow.  1. Please arrive at the [facility] (Main Entrance A) at [facility]: [address] [place], [place] [address] at 9:30 AM (This time is two hours before your procedure to ensure your preparation). Free valet parking service is available.   Special note: Every effort is made to have your procedure done on time. Please understand that emergencies sometimes delay scheduled procedures.  2. Diet: Do not eat solid foods after midnight.  The patient may have clear liquids until 5am upon the day of the procedure.  3. Labs: You will need to have blood drawn today.  4. Medication instructions in preparation for your procedure:  Do not take Diabetes Med Glucophage (Metformin) on the day of the procedure and HOLD 48 HOURS AFTER THE PROCEDURE.   On the morning of your procedure, take your Aspirin and any morning medicines NOT listed above.  You may use sips of water.  5. Plan for one night stay--bring personal belongings. 6. Bring a current list of your medications and current insurance cards. 7. You MUST have a responsible person to drive you home. 8. Someone MUST be with you the first 24 hours after you arrive home or your discharge will be delayed. 9. Please wear clothes that are easy to get on and off and wear slip-on shoes.  Thank you for allowing [place] to care for you!   -- [facility]

## 2021-10-26 NOTE — Assessment & Plan Note (Signed)
History of family history of heart disease with father who had stents in his 62s.

## 2021-10-26 NOTE — Progress Notes (Signed)
10/26/2021 KARMELO Schultz   02-01-1966  [id number]  Primary Physician London Pepper, MD Primary Cardiologist: Lorretta Harp MD Robert Schultz, Robert Schultz  HPI:  Robert Schultz is a 56 y.o.  moderately overweight married Caucasian male father of 3 children referred by Dr. Orland Schultz for cardiovascular valuation because of occasional dizziness and atypical chest pain.  He formally saw Dr. Doreatha Schultz as a cardiologist years ago.  I last saw him in the office 09/05/2020 . He did apparently have a Holter monitor that showed PVCs and an abnormal cardiac cath.  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  His risk factors include type 2 diabetes on Metformin, mild hyperlipidemia intolerant to statin therapy and family history of heart disease with a father who had stents in his 88s.  He has been taking care of his parents his caregivers for the last 14 half years and unfortunately his father passed away 04-Aug-2023 of this year at age 35 of cancer.  He is dealing with this currently.  He was put on Jardiance by his PCP and noticed the dizziness at that time which is since subsided after Jardiance was discontinued.  He has had infrequent episodes of atypical chest pain.  He does admit to dietary indiscretion.  The only exercise he gets is occasionally walking his dog.   He had a coronary calcium score 09/22/2019 which was 533.  Subsequent Myoview stress test performed 09/10/2019 was nonischemic.  I did refer him to Dr. Debara Schultz for lipid management and his most recent lipid profile performed 03/18/2019 did reveal total cholesterol 122, LDL 49 and HDL of 36 with a triglyceride level of 236.  He was recently seen in the urgent care center for hemoptysis thought to be related to an upper respiratory tract infection.  He was treated with prednisone and an oral antibiotic.  He was complaining some atypical chest pain/chest wall  pain and costochondritic symptoms.    Since I saw him a year ago he continues to have chest pain which is somewhat atypical and attributed to a GI etiology.  He had a repeat coronary calcium score of 1059 on 10/24/2021 and subsequent coronary CTA and FFR analysis that suggested three-vessel disease. Current Meds  Medication Sig   acetaminophen (TYLENOL) 500 MG tablet Take 500 mg by mouth every 6 (six) hours as needed.   Cyanocobalamin (B-12 COMPLIANCE INJECTION IJ) Inject as directed. WEEKLY   glipiZIDE (GLUCOTROL) 5 MG tablet Take by mouth.   metFORMIN (GLUCOPHAGE-XR) 500 MG 24 hr tablet Take 500 mg by mouth every evening.    metoprolol succinate (TOPROL XL) 25 MG 24 hr tablet Take 1 tablet (25 mg total) by mouth daily.   pantoprazole (PROTONIX) 40 MG tablet 1 TABLET ONCE A DAY TAKE 30 MINUTES PRIOR TO BREAKFAST.   Peppermint Oil (IBGARD) 90 MG CPCR Take 2 capsules by mouth 2 (two) times daily.   Probiotic Product (ALIGN) 4 MG CAPS Take 1 capsule by mouth See admin instructions.   rosuvastatin (CRESTOR) 20 MG tablet TAKE 1 TABLET (20 MG TOTAL) BY MOUTH DAILY. KEEP UPCOMING APPOINTMENT   sildenafil (REVATIO) 20 MG tablet Take 20 mg by mouth as needed.   sucralfate (CARAFATE) 1 g tablet Take 1 g by mouth 3 (three) times daily.     Allergies  Allergen Reactions   Hydrocodone Other (See Comments)    "I break  into a violent sweat--fast."   Penicillins     Profuse sweating Swelling of the face/tongue/throat, SOB, or low BP? N Sudden or severe rash/hives, skin peeling, or the inside of the mouth or nose? n Did it require medical treatment? N When did it last happen?      unk If all above answers are NO, may proceed with cephalosporin use.    Acetaminophen-Codeine Other (See Comments)   Atorvastatin     Restlessness    Codeine    Dihydrocodeine    Hydrocodone-Acetaminophen Swelling    Social History   Socioeconomic History   Marital status: Married    Spouse name: Not on file    Number of children: Not on file   Years of education: Not on file   Highest education level: Not on file  Occupational History   Not on file  Tobacco Use   Smoking status: Never   Smokeless tobacco: Never  Vaping Use   Vaping Use: Never used  Substance and Sexual Activity   Alcohol use: No   Drug use: No   Sexual activity: Yes  Other Topics Concern   Not on file  Social History Narrative   Not on file   Social Determinants of Health   Financial Resource Strain: Not on file  Food Insecurity: Not on file  Transportation Needs: Not on file  Physical Activity: Not on file  Stress: Not on file  Social Connections: Not on file  Intimate Partner Violence: Not on file     Review of Systems: General: negative for chills, fever, night sweats or weight changes.  Cardiovascular: negative for chest pain, dyspnea on exertion, edema, orthopnea, palpitations, paroxysmal nocturnal dyspnea or shortness of breath Dermatological: negative for rash Respiratory: negative for cough or wheezing Urologic: negative for hematuria Abdominal: negative for nausea, vomiting, diarrhea, bright red blood per rectum, melena, or hematemesis Neurologic: negative for visual changes, syncope, or dizziness All other systems reviewed and are otherwise negative except as noted above.    Blood pressure 120/82, pulse 97, resp. rate 20, height 5\' 8"  (1.727 m), weight 221 lb 12.8 oz (100.6 kg), SpO2 98 %.  General appearance: alert and no distress Neck: no adenopathy, no carotid bruit, no JVD, supple, symmetrical, trachea midline, and thyroid not enlarged, symmetric, no tenderness/mass/nodules Lungs: clear to auscultation bilaterally Heart: regular rate and rhythm, S1, S2 normal, no murmur, click, rub or gallop Extremities: extremities normal, atraumatic, no cyanosis or edema Pulses: 2+ and symmetric Skin: Skin color, texture, turgor normal. No rashes or lesions Neurologic: Grossly normal  EKG not performed  today chest pain EKG was done 10/17/2018.  ASSESSMENT AND PLAN:   HTN (hypertension) History of essential hypertension a blood pressure measured today at 120/82.  He is on metoprolol.  Hyperlipidemia History of hyperlipidemia on rosuvastatin with lipid profile performed 04/04/2021 revealing total cholesterol 120, LDL 66 and HDL of 46.  Family history of heart disease History of family history of heart disease with father who had stents in his 75s.  Elevated coronary artery calcium score Elevated coronary calcium score of 533 performed 09/22/2019.  He has been getting frequent chest pain recently which has been attributed to a GI etiology.  He had a coronary calcium score and CTA revealing a coronary calcium score 1059 with three-vessel disease.  Based on this, we have decided to proceed with outpatient diagnostic coronary angiography via the right radial approach.  I have reviewed the risks, indications, and alternatives to cardiac catheterization, possible angioplasty, and  stenting with the patient. Risks include but are not limited to bleeding, infection, vascular injury, stroke, myocardial infection, arrhythmia, kidney injury, radiation-related injury in the case of prolonged fluoroscopy use, emergency cardiac surgery, and death. The patient understands the risks of serious complication is 1-2 in 4353 with diagnostic cardiac cath and 1-2% or less with angioplasty/stenting.      Lorretta Harp MD FACP,FACC,FAHA, [facility] 10/26/2021 4:09 PM

## 2021-10-26 NOTE — H&P (View-Only) (Signed)
10/26/2021 AMAD MAU   [birth year]  [id number]  Primary Physician London Pepper, MD Primary Cardiologist: Lorretta Harp MD [name], [place]  HPI:  Robert Schultz is a 56 y.o.  moderately overweight married Caucasian male father of 3 children referred by Dr. Orland Mustard for cardiovascular valuation because of occasional dizziness and atypical chest pain.  He formally saw Dr. Doreatha Lew as a cardiologist years ago.  I last saw him in the office 09/05/2020 . He did apparently have a Holter monitor that showed PVCs and an abnormal cardiac cath.  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  His risk factors include type 2 diabetes on Metformin, mild hyperlipidemia intolerant to statin therapy and family history of heart disease with a father who had stents in his 64s.  He has been taking care of his parents his caregivers for the last 2 half years and unfortunately his father passed away 08-08-23 of this year at age 19 of cancer.  He is dealing with this currently.  He was put on Jardiance by his PCP and noticed the dizziness at that time which is since subsided after Jardiance was discontinued.  He has had infrequent episodes of atypical chest pain.  He does admit to dietary indiscretion.  The only exercise he gets is occasionally walking his dog.   He had a coronary calcium score 09/22/2019 which was 533.  Subsequent Myoview stress test performed 09/10/2019 was nonischemic.  I did refer him to Dr. Debara Pickett for lipid management and his most recent lipid profile performed 03/18/2019 did reveal total cholesterol 122, LDL 49 and HDL of 36 with a triglyceride level of 236.  He was recently seen in the urgent care center for hemoptysis thought to be related to an upper respiratory tract infection.  He was treated with prednisone and an oral antibiotic.  He was complaining some atypical chest pain/chest wall  pain and costochondritic symptoms.    Since I saw him a year ago he continues to have chest pain which is somewhat atypical and attributed to a GI etiology.  He had a repeat coronary calcium score of 1059 on 10/24/2021 and subsequent coronary CTA and FFR analysis that suggested three-vessel disease. Current Meds  Medication Sig   acetaminophen (TYLENOL) 500 MG tablet Take 500 mg by mouth every 6 (six) hours as needed.   Cyanocobalamin (B-12 COMPLIANCE INJECTION IJ) Inject as directed. WEEKLY   glipiZIDE (GLUCOTROL) 5 MG tablet Take by mouth.   metFORMIN (GLUCOPHAGE-XR) 500 MG 24 hr tablet Take 500 mg by mouth every evening.    metoprolol succinate (TOPROL XL) 25 MG 24 hr tablet Take 1 tablet (25 mg total) by mouth daily.   pantoprazole (PROTONIX) 40 MG tablet 1 TABLET ONCE A DAY TAKE 30 MINUTES PRIOR TO BREAKFAST.   Peppermint Oil (IBGARD) 90 MG CPCR Take 2 capsules by mouth 2 (two) times daily.   Probiotic Product (ALIGN) 4 MG CAPS Take 1 capsule by mouth See admin instructions.   rosuvastatin (CRESTOR) 20 MG tablet TAKE 1 TABLET (20 MG TOTAL) BY MOUTH DAILY. KEEP UPCOMING APPOINTMENT   sildenafil (REVATIO) 20 MG tablet Take 20 mg by mouth as needed.   sucralfate (CARAFATE) 1 g tablet Take 1 g by mouth 3 (three) times daily.     Allergies  Allergen Reactions   Hydrocodone Other (See Comments)    "I break  into a violent sweat--fast."   Penicillins     Profuse sweating Swelling of the face/tongue/throat, SOB, or low BP? N Sudden or severe rash/hives, skin peeling, or the inside of the mouth or nose? n Did it require medical treatment? N When did it last happen?      unk If all above answers are NO, may proceed with cephalosporin use.    Acetaminophen-Codeine Other (See Comments)   Atorvastatin     Restlessness    Codeine    Dihydrocodeine    Hydrocodone-Acetaminophen Swelling    Social History   Socioeconomic History   Marital status: Married    Spouse name: Not on file    Number of children: Not on file   Years of education: Not on file   Highest education level: Not on file  Occupational History   Not on file  Tobacco Use   Smoking status: Never   Smokeless tobacco: Never  Vaping Use   Vaping Use: Never used  Substance and Sexual Activity   Alcohol use: No   Drug use: No   Sexual activity: Yes  Other Topics Concern   Not on file  Social History Narrative   Not on file   Social Determinants of Health   Financial Resource Strain: Not on file  Food Insecurity: Not on file  Transportation Needs: Not on file  Physical Activity: Not on file  Stress: Not on file  Social Connections: Not on file  Intimate Partner Violence: Not on file     Review of Systems: General: negative for chills, fever, night sweats or weight changes.  Cardiovascular: negative for chest pain, dyspnea on exertion, edema, orthopnea, palpitations, paroxysmal nocturnal dyspnea or shortness of breath Dermatological: negative for rash Respiratory: negative for cough or wheezing Urologic: negative for hematuria Abdominal: negative for nausea, vomiting, diarrhea, bright red blood per rectum, melena, or hematemesis Neurologic: negative for visual changes, syncope, or dizziness All other systems reviewed and are otherwise negative except as noted above.    Blood pressure 120/82, pulse 97, resp. rate 20, height 5\' 8"  (1.727 m), weight 221 lb 12.8 oz (100.6 kg), SpO2 98 %.  General appearance: alert and no distress Neck: no adenopathy, no carotid bruit, no JVD, supple, symmetrical, trachea midline, and thyroid not enlarged, symmetric, no tenderness/mass/nodules Lungs: clear to auscultation bilaterally Heart: regular rate and rhythm, S1, S2 normal, no murmur, click, rub or gallop Extremities: extremities normal, atraumatic, no cyanosis or edema Pulses: 2+ and symmetric Skin: Skin color, texture, turgor normal. No rashes or lesions Neurologic: Grossly normal  EKG not performed  today chest pain EKG was done 10/17/2018.  ASSESSMENT AND PLAN:   HTN (hypertension) History of essential hypertension a blood pressure measured today at 120/82.  He is on metoprolol.  Hyperlipidemia History of hyperlipidemia on rosuvastatin with lipid profile performed 04/04/2021 revealing total cholesterol 120, LDL 66 and HDL of 46.  Family history of heart disease History of family history of heart disease with father who had stents in his 67s.  Elevated coronary artery calcium score Elevated coronary calcium score of 533 performed 09/22/2019.  He has been getting frequent chest pain recently which has been attributed to a GI etiology.  He had a coronary calcium score and CTA revealing a coronary calcium score 1059 with three-vessel disease.  Based on this, we have decided to proceed with outpatient diagnostic coronary angiography via the right radial approach.  I have reviewed the risks, indications, and alternatives to cardiac catheterization, possible angioplasty, and  stenting with the patient. Risks include but are not limited to bleeding, infection, vascular injury, stroke, myocardial infection, arrhythmia, kidney injury, radiation-related injury in the case of prolonged fluoroscopy use, emergency cardiac surgery, and death. The patient understands the risks of serious complication is 1-2 in 9276 with diagnostic cardiac cath and 1-2% or less with angioplasty/stenting.      Lorretta Harp MD FACP,FACC,FAHA, [facility] 10/26/2021 4:09 PM

## 2021-10-27 LAB — BASIC METABOLIC PANEL
BUN/Creatinine Ratio: 9 (ref 9–20)
BUN: 8 mg/dL (ref 6–24)
CO2: 26 mmol/L (ref 20–29)
Calcium: 9.1 mg/dL (ref 8.7–10.2)
Chloride: 101 mmol/L (ref 96–106)
Creatinine, Ser: 0.91 mg/dL (ref 0.76–1.27)
Glucose: 121 mg/dL — ABNORMAL HIGH (ref 70–99)
Potassium: 4.1 mmol/L (ref 3.5–5.2)
Sodium: 142 mmol/L (ref 134–144)
eGFR: 100 mL/min/{1.73_m2} (ref >59)

## 2021-10-27 LAB — CBC
Hematocrit: 40.4 % (ref 37.5–51.0)
Hemoglobin: 13.6 g/dL (ref 13.0–17.7)
MCH: 31.5 pg (ref 26.6–33.0)
MCHC: 33.7 g/dL (ref 31.5–35.7)
MCV: 94 fL (ref 79–97)
Platelets: 254 10*3/uL (ref 150–450)
RBC: 4.32 x10E6/uL (ref 4.14–5.80)
RDW: 12.5 % (ref 11.6–15.4)
WBC: 14.6 10*3/uL — ABNORMAL HIGH (ref 3.4–10.8)

## 2021-10-29 ENCOUNTER — Ambulatory Visit (HOSPITAL_COMMUNITY)
Admission: RE | Admit: 2021-10-29 | Discharge: 2021-10-29 | Disposition: A | Discharge status: Home / Self Care | Payer: BC Managed Care – PPO | Source: Ambulatory Visit | Attending: Cardiovascular Disease | Admitting: Cardiovascular Disease

## 2021-10-29 ENCOUNTER — Other Ambulatory Visit: Payer: Self-pay

## 2021-10-29 ENCOUNTER — Encounter (HOSPITAL_COMMUNITY)
Admission: RE | Disposition: A | Discharge status: Home / Self Care | Payer: Self-pay | Source: Ambulatory Visit | Attending: Cardiovascular Disease

## 2021-10-29 ENCOUNTER — Encounter (HOSPITAL_COMMUNITY): Payer: Self-pay | Admitting: Cardiovascular Disease

## 2021-10-29 DIAGNOSIS — I10 Essential (primary) hypertension: Secondary | ICD-10-CM | POA: Diagnosis not present

## 2021-10-29 DIAGNOSIS — Z79899 Other long term (current) drug therapy: Secondary | ICD-10-CM | POA: Insufficient documentation

## 2021-10-29 DIAGNOSIS — E119 Type 2 diabetes mellitus without complications: Secondary | ICD-10-CM | POA: Insufficient documentation

## 2021-10-29 DIAGNOSIS — E785 Hyperlipidemia, unspecified: Secondary | ICD-10-CM | POA: Insufficient documentation

## 2021-10-29 DIAGNOSIS — Z7984 Long term (current) use of oral hypoglycemic drugs: Secondary | ICD-10-CM | POA: Diagnosis not present

## 2021-10-29 DIAGNOSIS — I2510 Atherosclerotic heart disease of native coronary artery without angina pectoris: Secondary | ICD-10-CM | POA: Insufficient documentation

## 2021-10-29 DIAGNOSIS — Z8249 Family history of ischemic heart disease and other diseases of the circulatory system: Secondary | ICD-10-CM

## 2021-10-29 DIAGNOSIS — R931 Abnormal findings on diagnostic imaging of heart and coronary circulation: Secondary | ICD-10-CM

## 2021-10-29 HISTORY — PX: LEFT HEART CATH AND CORONARY ANGIOGRAPHY: CATH118249

## 2021-10-29 LAB — GLUCOSE, CAPILLARY: Glucose-Capillary: 135 mg/dL — ABNORMAL HIGH (ref 70–99)

## 2021-10-29 SURGERY — LEFT HEART CATH AND CORONARY ANGIOGRAPHY
Anesthesia: LOCAL

## 2021-10-29 MED ORDER — LABETALOL HCL 5 MG/ML IV SOLN: 10.0000 mg | INTRAVENOUS | Status: DC | PRN

## 2021-10-29 MED ORDER — ASPIRIN 81 MG PO CHEW: 81.0000 mg | CHEWABLE_TABLET | ORAL | Status: DC

## 2021-10-29 MED ORDER — HEPARIN SODIUM (PORCINE) 1000 UNIT/ML IJ SOLN
INTRAMUSCULAR | Status: DC | PRN
  Administered 2021-10-29: 5000 [IU] via INTRAVENOUS

## 2021-10-29 MED ORDER — ONDANSETRON HCL 4 MG/2ML IJ SOLN: 4.0000 mg | Freq: Four times a day (QID) | INTRAMUSCULAR | Status: DC | PRN

## 2021-10-29 MED ORDER — MIDAZOLAM HCL 2 MG/2ML IJ SOLN
INTRAMUSCULAR | Status: DC | PRN
  Administered 2021-10-29: 1 mg via INTRAVENOUS

## 2021-10-29 MED ORDER — IOHEXOL 350 MG/ML SOLN
INTRAVENOUS | Status: DC | PRN
  Administered 2021-10-29: 130 mL via INTRA_ARTERIAL

## 2021-10-29 MED ORDER — SODIUM CHLORIDE 0.9% FLUSH: 3.0000 mL | Freq: Two times a day (BID) | INTRAVENOUS | Status: DC

## 2021-10-29 MED ORDER — HEPARIN (PORCINE) IN NACL 1000-0.9 UT/500ML-% IV SOLN
INTRAVENOUS | Status: AC
  Filled 2021-10-29: qty 500

## 2021-10-29 MED ORDER — SODIUM CHLORIDE 0.9 % WEIGHT BASED INFUSION
3.0000 mL/kg/h | INTRAVENOUS | Status: AC
  Administered 2021-10-29: 3 mL/kg/h via INTRAVENOUS

## 2021-10-29 MED ORDER — VERAPAMIL HCL 2.5 MG/ML IV SOLN
INTRAVENOUS | Status: AC
  Filled 2021-10-29: qty 2

## 2021-10-29 MED ORDER — SODIUM CHLORIDE 0.9 % IV SOLN: 250.0000 mL | INTRAVENOUS | Status: DC | PRN

## 2021-10-29 MED ORDER — ACETAMINOPHEN 325 MG PO TABS
650.0000 mg | ORAL_TABLET | ORAL | Status: DC | PRN
  Administered 2021-10-29: 650 mg via ORAL
  Filled 2021-10-29: qty 2

## 2021-10-29 MED ORDER — VERAPAMIL HCL 2.5 MG/ML IV SOLN
INTRA_ARTERIAL | Status: DC | PRN
  Administered 2021-10-29: 5 mL via INTRA_ARTERIAL

## 2021-10-29 MED ORDER — SODIUM CHLORIDE 0.9 % WEIGHT BASED INFUSION: 1.0000 mL/kg/h | INTRAVENOUS | Status: DC

## 2021-10-29 MED ORDER — NITROGLYCERIN 1 MG/10 ML FOR IR/CATH LAB
INTRA_ARTERIAL | Status: AC
  Filled 2021-10-29: qty 10

## 2021-10-29 MED ORDER — SODIUM CHLORIDE 0.9% FLUSH: 3.0000 mL | INTRAVENOUS | Status: DC | PRN

## 2021-10-29 MED ORDER — MIDAZOLAM HCL 2 MG/2ML IJ SOLN
INTRAMUSCULAR | Status: AC
  Filled 2021-10-29: qty 2

## 2021-10-29 MED ORDER — SODIUM CHLORIDE 0.9 % IV SOLN: INTRAVENOUS | Status: DC

## 2021-10-29 MED ORDER — HEPARIN (PORCINE) IN NACL 1000-0.9 UT/500ML-% IV SOLN
INTRAVENOUS | Status: DC | PRN
  Administered 2021-10-29 (×2): 500 mL

## 2021-10-29 MED ORDER — FENTANYL CITRATE (PF) 100 MCG/2ML IJ SOLN
INTRAMUSCULAR | Status: AC
  Filled 2021-10-29: qty 2

## 2021-10-29 MED ORDER — HYDRALAZINE HCL 20 MG/ML IJ SOLN: 10.0000 mg | INTRAMUSCULAR | Status: DC | PRN

## 2021-10-29 MED ORDER — LIDOCAINE HCL (PF) 1 % IJ SOLN
INTRAMUSCULAR | Status: DC | PRN
  Administered 2021-10-29: 2 mL

## 2021-10-29 MED ORDER — ASPIRIN 81 MG PO CHEW: 81.0000 mg | CHEWABLE_TABLET | Freq: Every day | ORAL | Status: DC

## 2021-10-29 MED ORDER — FENTANYL CITRATE (PF) 100 MCG/2ML IJ SOLN
INTRAMUSCULAR | Status: DC | PRN
  Administered 2021-10-29: 25 ug via INTRAVENOUS

## 2021-10-29 MED ORDER — HEPARIN SODIUM (PORCINE) 1000 UNIT/ML IJ SOLN
INTRAMUSCULAR | Status: AC
  Filled 2021-10-29: qty 10

## 2021-10-29 MED ORDER — LIDOCAINE HCL (PF) 1 % IJ SOLN
INTRAMUSCULAR | Status: AC
  Filled 2021-10-29: qty 30

## 2021-10-29 SURGICAL SUPPLY — 11 items
CATH 5FR JL3.5 JR4 ANG PIG MP (CATHETERS) ×1 IMPLANT
CATH LAUNCHER 6FR AL.75 (CATHETERS) ×1 IMPLANT
DEVICE RAD COMP TR BAND LRG (VASCULAR PRODUCTS) ×1 IMPLANT
GLIDESHEATH SLEND A-KIT 6F 22G (SHEATH) ×1 IMPLANT
GUIDEWIRE INQWIRE 1.5J.035X260 (WIRE) IMPLANT
INQWIRE 1.5J .035X260CM (WIRE) ×2
KIT HEART LEFT (KITS) ×2 IMPLANT
PACK CARDIAC CATHETERIZATION (CUSTOM PROCEDURE TRAY) ×2 IMPLANT
TRANSDUCER W/STOPCOCK (MISCELLANEOUS) ×2 IMPLANT
TUBING CIL FLEX 10 FLL-RA (TUBING) ×2 IMPLANT
WIRE HI TORQ VERSACORE-J 145CM (WIRE) ×1 IMPLANT

## 2021-10-29 NOTE — Interval H&P Note (Signed)
Cath Lab Visit (complete for each Cath Lab visit)  Clinical Evaluation Leading to the Procedure:   ACS: No.  Non-ACS:    Anginal Classification: CCS II  Anti-ischemic medical therapy: Minimal Therapy (1 class of medications)  Non-Invasive Test Results: No non-invasive testing performed  Prior CABG: No previous CABG      History and Physical Interval Note:  10/29/2021 10:18 AM  Robert Schultz  has presented today for surgery, with the diagnosis of abnormal cta.  The various methods of treatment have been discussed with the patient and family. After consideration of risks, benefits and other options for treatment, the patient has consented to  Procedure(s): LEFT HEART CATH AND CORONARY ANGIOGRAPHY (N/A) as a surgical intervention.  The patient's history has been reviewed, patient examined, no change in status, stable for surgery.  I have reviewed the patient's chart and labs.  Questions were answered to the patient's satisfaction.     Quay Burow

## 2021-10-31 ENCOUNTER — Emergency Department (INDEPENDENT_AMBULATORY_CARE_PROVIDER_SITE_OTHER)
Admission: EM | Admit: 2021-10-31 | Discharge: 2021-10-31 | Disposition: A | Discharge status: Home / Self Care | Payer: BC Managed Care – PPO | Source: Home / Self Care | Attending: Family Medicine | Admitting: Family Medicine

## 2021-10-31 ENCOUNTER — Other Ambulatory Visit: Payer: Self-pay

## 2021-10-31 DIAGNOSIS — J029 Acute pharyngitis, unspecified: Secondary | ICD-10-CM

## 2021-10-31 DIAGNOSIS — E538 Deficiency of other specified B group vitamins: Secondary | ICD-10-CM | POA: Diagnosis not present

## 2021-10-31 MED ORDER — AZITHROMYCIN 250 MG PO TABS: 250.0000 mg | ORAL_TABLET | Freq: Every day | ORAL | 0 refills | Status: DC

## 2021-10-31 NOTE — ED Triage Notes (Signed)
Pt here today c/o cough, sore throat and bilateral ear pain since last Monday. Says Sat he woke up with eyes crusted shut. Taking mucinex and tylenol prn. Denies fever. No known covid or flu exposure.

## 2021-10-31 NOTE — ED Provider Notes (Signed)
[name]    CSN: [id number] Arrival date & time: 10/31/21  1010      History   Chief Complaint Chief Complaint  Patient presents with   Cough   Otalgia   Sore Throat    HPI Robert Schultz is a 56 y.o. male.   HPI  Patient has diabetes hypertension hyperlipidemia and heart disease.  He is here for an upper respiratory infection.  He states he has been sick for about 10 days.  Initially with some runny nose cough and sinus congestion.  Now with sore throat.  He feels like he is getting worse the last couple of days.  He states that he has had discharge from his sinuses that is blood-streaked.  Past Medical History:  Diagnosis Date   AV block    Cervical radicular pain    Diverticulitis of colon    Gastritis    HTN (hypertension)    Hyperlipidemia    Morbid obesity ([place])    Normal coronary angiogram 2004   Palpitations    Radiculitis of leg    Sleep apnea    Thoracic spine pain     Patient Active Problem List   Diagnosis Date Noted   Elevated coronary artery calcium score 09/05/2020   Vestibulitis of ear, left 10/21/2019   Atypical chest pain 09/07/2019   Family history of heart disease 09/07/2019   Acute pancreatitis 09/16/2018   Pancreatitis 09/15/2018   Hepatic steatosis 09/15/2018   Obesity 09/15/2018   GERD (gastroesophageal reflux disease) 09/15/2018   CAP (community acquired pneumonia) 07/02/2014   Sepsis ([place]) 07/02/2014   DM (diabetes mellitus) ([place]) 08/26/2013   Numbness 08/26/2013   Cardiovascular risk factor 04/04/2011   HTN (hypertension) 03/08/2011   Heart palpitations 03/08/2011   Hyperlipidemia 03/08/2011   OBSTRUCTIVE SLEEP APNEA 05/19/2009   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 EF 65%   CERVICAL DISCECTOMY     LEFT HEART CATH AND CORONARY ANGIOGRAPHY N/A 10/29/2021   Procedure:  LEFT HEART CATH AND CORONARY ANGIOGRAPHY;  Surgeon: Lorretta Harp, MD;  Location: [facility];  Service: Cardiovascular;  Laterality: N/A;       Home Medications    Prior to Admission medications   Medication Sig Start Date End Date Taking? Authorizing Provider  azithromycin (ZITHROMAX) 250 MG tablet Take 1 tablet (250 mg total) by mouth daily. Take first 2 tablets together, then 1 every day until finished. 10/31/21  Yes Raylene Everts, MD  acetaminophen (TYLENOL) 500 MG tablet Take 1,000 mg by mouth every 6 (six) hours as needed.    [provider]  aspirin EC 81 MG tablet Take 81 mg by mouth daily. Swallow whole.    [provider]  Cyanocobalamin (B-12 COMPLIANCE INJECTION IJ) Inject 1 Dose as directed every Tuesday.    [provider]  glipiZIDE (GLUCOTROL XL) 5 MG 24 hr tablet Take 5 mg by mouth daily.    [provider]  metFORMIN (GLUCOPHAGE-XR) 500 MG 24 hr tablet Take 1,000 mg by mouth 2 (two) times daily. 12/12/16   [provider]  metoprolol succinate (TOPROL XL) 25 MG 24 hr tablet Take 1 tablet (25 mg total) by mouth daily. Patient taking differently: Take 25 mg by mouth daily as needed (pvc flare). 09/19/21   Lorretta Harp, MD  Multiple Vitamin (  MULTIVITAMIN WITH MINERALS) TABS tablet Take 1 tablet by mouth daily.    [provider]  pantoprazole (PROTONIX) 40 MG tablet 1 TABLET ONCE A DAY TAKE 30 MINUTES PRIOR TO BREAKFAST.    [provider]  Peppermint Oil (IBGARD) 90 MG CPCR Take 2 capsules by mouth 2 (two) times daily. 10/02/21   [provider]  Probiotic Product (ALIGN) 4 MG CAPS Take 4 mg by mouth daily. 10/02/21   [provider]  rosuvastatin (CRESTOR) 20 MG tablet TAKE 1 TABLET (20 MG TOTAL) BY MOUTH DAILY. KEEP UPCOMING APPOINTMENT 06/06/21   Pixie Casino, MD  sildenafil (REVATIO) 20 MG tablet Take 40 mg by mouth as needed (ED). 04/04/21   [provider]   sucralfate (CARAFATE) 1 g tablet Take 1 g by mouth 3 (three) times daily. 09/17/21   [provider]    Family History Family History  Problem Relation Age of Onset   Multiple sclerosis Mother    Hypertension Father    Cancer Father     Social History Social History   Tobacco Use   Smoking status: Never   Smokeless tobacco: Never  Vaping Use   Vaping Use: Never used  Substance Use Topics   Alcohol use: No   Drug use: No     Allergies   Hydrocodone, Penicillins, Atorvastatin, Dihydrocodeine, Acetaminophen-codeine, and Codeine   Review of Systems Review of Systems See HPI  Physical Exam Triage Vital Signs ED Triage Vitals [10/31/21 1027]  Enc Vitals Group     BP 126/85     Pulse Rate 86     Resp 18     Temp 98.3 F (36.8 C)     Temp Source Oral     SpO2 98 %     Weight      Height      Head Circumference      Peak Flow      Pain Score 2     Pain Loc      Pain Edu?      Excl. in [place]?    No data found.  Updated Vital Signs BP 126/85 (BP Location: Right Arm)    Pulse 86    Temp 98.3 F (36.8 C) (Oral)    Resp 18    SpO2 98%       Physical Exam Constitutional:      General: He is not in acute distress.    Appearance: He is well-developed and normal weight.  HENT:     Head: Normocephalic and atraumatic.     Right Ear: Tympanic membrane normal.     Left Ear: Tympanic membrane normal.     Ears:     Comments: Both TMs partially obstructed by cerumen, appear clear    Nose: Congestion and rhinorrhea present.     Mouth/Throat:     Mouth: Mucous membranes are dry.     Pharynx: Posterior oropharyngeal erythema present.     Tonsils: No tonsillar exudate. 0 on the right. 0 on the left.  Eyes:     Conjunctiva/sclera: Conjunctivae normal.     Pupils: Pupils are equal, round, and reactive to light.  Cardiovascular:     Rate and Rhythm: Normal rate and regular rhythm.     Heart sounds: Normal heart sounds.  Pulmonary:     Effort: Pulmonary  effort is normal. No respiratory distress.     Breath sounds: Normal breath sounds.  Abdominal:     General: There is no distension.  Palpations: Abdomen is soft.  Musculoskeletal:        General: Normal range of motion.     Cervical back: Normal range of motion.  Lymphadenopathy:     Cervical: Cervical adenopathy present.  Skin:    General: Skin is warm and dry.  Neurological:     Mental Status: He is alert.  Psychiatric:        Mood and Affect: Mood normal.        Behavior: Behavior normal.     UC Treatments / Results  Labs (all labs ordered are listed, but only abnormal results are displayed) Labs Reviewed - No data to display  EKG   Radiology No results found.  Procedures Procedures (including critical care time)  Medications Ordered in UC Medications - No data to display  Initial Impression / Assessment and Plan / UC Course  I have reviewed the triage vital signs and the nursing notes.  Pertinent labs & imaging results that were available during my care of the patient were reviewed by me and considered in my medical decision making (see chart for details).     Patient voices allergy to penicillin.  We will give him a Z-Pak.Follow-up with primary care Final Clinical Impressions(s) / UC Diagnoses   Final diagnoses:  Sore throat     Discharge Instructions      Continue to drink lots of fluids Take the Z-Pak as directed 2 pills today, and then 1 a day until gone May take Mucinex DM for sinus congestion and coughing    ED Prescriptions     Medication Sig Dispense Auth. Provider   azithromycin (ZITHROMAX) 250 MG tablet Take 1 tablet (250 mg total) by mouth daily. Take first 2 tablets together, then 1 every day until finished. 6 tablet Raylene Everts, MD      PDMP not reviewed this encounter.   Raylene Everts, MD 10/31/21 1134

## 2021-10-31 NOTE — Discharge Instructions (Signed)
Continue to drink lots of fluids Take the Z-Pak as directed 2 pills today, and then 1 a day until gone May take Mucinex DM for sinus congestion and coughing

## 2021-11-06 DIAGNOSIS — D3001 Benign neoplasm of right kidney: Secondary | ICD-10-CM | POA: Diagnosis not present

## 2021-11-06 DIAGNOSIS — N400 Enlarged prostate without lower urinary tract symptoms: Secondary | ICD-10-CM | POA: Diagnosis not present

## 2021-11-07 DIAGNOSIS — E538 Deficiency of other specified B group vitamins: Secondary | ICD-10-CM | POA: Diagnosis not present

## 2021-11-13 DIAGNOSIS — M2569 Stiffness of other specified joint, not elsewhere classified: Secondary | ICD-10-CM | POA: Diagnosis not present

## 2021-11-13 DIAGNOSIS — M47812 Spondylosis without myelopathy or radiculopathy, cervical region: Secondary | ICD-10-CM | POA: Diagnosis not present

## 2021-11-13 DIAGNOSIS — Z7409 Other reduced mobility: Secondary | ICD-10-CM | POA: Diagnosis not present

## 2021-11-13 DIAGNOSIS — M436 Torticollis: Secondary | ICD-10-CM | POA: Diagnosis not present

## 2021-11-20 ENCOUNTER — Encounter: Payer: Self-pay | Admitting: Cardiovascular Disease

## 2021-11-20 ENCOUNTER — Ambulatory Visit (INDEPENDENT_AMBULATORY_CARE_PROVIDER_SITE_OTHER): Payer: BC Managed Care – PPO | Admitting: Cardiovascular Disease

## 2021-11-20 ENCOUNTER — Other Ambulatory Visit: Payer: Self-pay

## 2021-11-20 DIAGNOSIS — E782 Mixed hyperlipidemia: Secondary | ICD-10-CM | POA: Diagnosis not present

## 2021-11-20 DIAGNOSIS — I25119 Atherosclerotic heart disease of native coronary artery with unspecified angina pectoris: Secondary | ICD-10-CM | POA: Diagnosis not present

## 2021-11-20 DIAGNOSIS — I10 Essential (primary) hypertension: Secondary | ICD-10-CM | POA: Diagnosis not present

## 2021-11-20 DIAGNOSIS — I2510 Atherosclerotic heart disease of native coronary artery without angina pectoris: Secondary | ICD-10-CM | POA: Insufficient documentation

## 2021-11-20 NOTE — Progress Notes (Signed)
11/20/2021 Robert Schultz   December 17, 1965  [id number]  Primary Physician Robert Pepper, MD Primary Cardiologist: Robert Harp MD Robert Schultz, [place]  HPI:  Robert Schultz is a 56 y.o.  moderately overweight married Caucasian male father of 3 children referred by Dr. Orland Schultz for cardiovascular evaluation because of occasional dizziness and atypical chest pain.  He formally saw Dr. Doreatha Schultz as a cardiologist years ago.  I last saw him in the office 10/26/2021 He did apparently have a Holter monitor that showed PVCs and an abnormal cardiac cath.  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  His risk factors include type 2 diabetes on Metformin, mild hyperlipidemia intolerant to statin therapy and family history of heart disease with a father who had stents in his 75s.  He has been taking care of his parents his caregivers for the last 88 half years and unfortunately his father passed away 07-20-23 of this year at age 31 of cancer.  He is dealing with this currently.  He was put on Jardiance by his PCP and noticed the dizziness at that time which is since subsided after Jardiance was discontinued.  He has had infrequent episodes of atypical chest pain.  He does admit to dietary indiscretion.  The only exercise he gets is occasionally walking his dog.   He had a coronary calcium score 09/22/2019 which was 533.  Subsequent Myoview stress test performed 09/10/2019 was nonischemic.  I did refer him to Dr. Debara Schultz for lipid management and his most recent lipid profile performed 03/18/2019 did reveal total cholesterol 122, LDL 49 and HDL of 36 with a triglyceride level of 236.  He was recently seen in the urgent care center for hemoptysis thought to be related to an upper respiratory tract infection.  He was treated with prednisone and an oral antibiotic.  He was complaining some atypical chest pain/chest wall pain  and costochondritic symptoms.     He had a repeat coronary calcium score of 1059 on 10/24/2021 and subsequent coronary CTA and FFR analysis that suggested three-vessel disease.  Based on this I performed outpatient radial diagnostic cath on him 11/26/2021 revealing noncritical CAD.  Since that time he suspects that his chest pain was related to stress and he is altered his lifestyle with significant improvement in his symptoms.   Current Meds  Medication Sig   acetaminophen (TYLENOL) 500 MG tablet Take 1,000 mg by mouth every 6 (six) hours as needed.   aspirin EC 81 MG tablet Take 81 mg by mouth daily. Swallow whole.   Cyanocobalamin (B-12 COMPLIANCE INJECTION IJ) Inject 1 Dose as directed every Tuesday.   glipiZIDE (GLUCOTROL XL) 5 MG 24 hr tablet Take 5 mg by mouth daily.   metFORMIN (GLUCOPHAGE-XR) 500 MG 24 hr tablet Take 1,000 mg by mouth 2 (two) times daily.   metoprolol succinate (TOPROL XL) 25 MG 24 hr tablet Take 1 tablet (25 mg total) by mouth daily. (Patient taking differently: Take 25 mg by mouth daily as needed (pvc flare).)   Multiple Vitamin (MULTIVITAMIN WITH MINERALS) TABS tablet Take 1 tablet by mouth daily.   pantoprazole (PROTONIX) 40 MG tablet 1 TABLET ONCE A DAY TAKE 30 MINUTES PRIOR TO BREAKFAST.   Peppermint Oil (IBGARD) 90 MG CPCR Take 2 capsules by mouth 2 (two) times daily.   Probiotic Product (ALIGN) 4 MG CAPS Take 4 mg  by mouth daily.   rosuvastatin (CRESTOR) 20 MG tablet TAKE 1 TABLET (20 MG TOTAL) BY MOUTH DAILY. KEEP UPCOMING APPOINTMENT   sildenafil (REVATIO) 20 MG tablet Take 40 mg by mouth as needed (ED).   sucralfate (CARAFATE) 1 g tablet Take 1 g by mouth 3 (three) times daily.   Current Facility-Administered Medications for the 11/20/21 encounter (Office Visit) with Robert Harp, MD  Medication   sodium chloride flush (NS) 0.9 % injection 3 mL     Allergies  Allergen Reactions   Hydrocodone Other (See Comments)    "I break into a violent  sweat--fast."   Penicillins     Profuse sweating Swelling of the face/tongue/throat, SOB, or low BP? N Sudden or severe rash/hives, skin peeling, or the inside of the mouth or nose? n Did it require medical treatment? N When did it last happen?      unk If all above answers are NO, may proceed with cephalosporin use.    Atorvastatin     Restlessness    Dihydrocodeine     Sweating    Acetaminophen-Codeine Rash   Codeine Rash    Social History   Socioeconomic History   Marital status: Married    Spouse name: Not on file   Number of children: Not on file   Years of education: Not on file   Highest education level: Not on file  Occupational History   Not on file  Tobacco Use   Smoking status: Never   Smokeless tobacco: Never  Vaping Use   Vaping Use: Never used  Substance and Sexual Activity   Alcohol use: No   Drug use: No   Sexual activity: Yes  Other Topics Concern   Not on file  Social History Narrative   Not on file   Social Determinants of Health   Financial Resource Strain: Not on file  Food Insecurity: Not on file  Transportation Needs: Not on file  Physical Activity: Not on file  Stress: Not on file  Social Connections: Not on file  Intimate Partner Violence: Not on file     Review of Systems: General: negative for chills, fever, night sweats or weight changes.  Cardiovascular: negative for chest pain, dyspnea on exertion, edema, orthopnea, palpitations, paroxysmal nocturnal dyspnea or shortness of breath Dermatological: negative for rash Respiratory: negative for cough or wheezing Urologic: negative for hematuria Abdominal: negative for nausea, vomiting, diarrhea, bright red blood per rectum, melena, or hematemesis Neurologic: negative for visual changes, syncope, or dizziness All other systems reviewed and are otherwise negative except as noted above.    Blood pressure 130/86, pulse 93, height 5\' 8"  (1.727 m), weight 224 lb (101.6 kg), SpO2 97  %.  General appearance: alert and no distress Neck: no adenopathy, no carotid bruit, no JVD, supple, symmetrical, trachea midline, and thyroid not enlarged, symmetric, no tenderness/mass/nodules Lungs: clear to auscultation bilaterally Heart: regular rate and rhythm, S1, S2 normal, no murmur, click, rub or gallop Extremities: extremities normal, atraumatic, no cyanosis or edema Pulses: 2+ and symmetric Skin: Skin color, texture, turgor normal. No rashes or lesions Neurologic: Grossly normal  EKG not performed today  ASSESSMENT AND PLAN:   Coronary artery disease Which I performed as an outpatient radially 10/29/2021.  This was done because of atypical chest pain and a coronary CTA performed 10/24/2021 revealing a coronary calcium score of 1059 with FFR analysis suggesting three-vessel disease.  His cardiac cath however showed only 40% blockages in his left main, LAD and circumflex  with a clean RCA and normal LV function.  Since his procedure his pain is resolved.  He suspects in retrospect that it was "stress-related".  HTN (hypertension) History of essential hypertension blood pressure measured today at 130/86.  He is on metoprolol.  Hyperlipidemia History of hyperlipidemia on statin therapy with lipid profile performed 04/04/2021 revealing total cholesterol 120, LDL 48 and HDL 46.     Robert Harp MD [facility] 11/20/2021 3:28 PM

## 2021-11-20 NOTE — Assessment & Plan Note (Signed)
Which I performed as an outpatient radially 10/29/2021.  This was done because of atypical chest pain and a coronary CTA performed 10/24/2021 revealing a coronary calcium score of 1059 with FFR analysis suggesting three-vessel disease.  His cardiac cath however showed only 40% blockages in his left main, LAD and circumflex with a clean RCA and normal LV function.  Since his procedure his pain is resolved.  He suspects in retrospect that it was "stress-related".

## 2021-11-20 NOTE — Patient Instructions (Signed)

## 2021-11-20 NOTE — Assessment & Plan Note (Signed)
History of essential hypertension blood pressure measured today at 130/86.  He is on metoprolol.

## 2021-11-20 NOTE — Assessment & Plan Note (Signed)
History of hyperlipidemia on statin therapy with lipid profile performed 04/04/2021 revealing total cholesterol 120, LDL 48 and HDL 46.

## 2021-12-05 DIAGNOSIS — E538 Deficiency of other specified B group vitamins: Secondary | ICD-10-CM | POA: Diagnosis not present

## 2021-12-14 DIAGNOSIS — Z4789 Encounter for other orthopedic aftercare: Secondary | ICD-10-CM | POA: Diagnosis not present

## 2021-12-14 DIAGNOSIS — M4312 Spondylolisthesis, cervical region: Secondary | ICD-10-CM | POA: Diagnosis not present

## 2021-12-14 DIAGNOSIS — M4722 Other spondylosis with radiculopathy, cervical region: Secondary | ICD-10-CM | POA: Diagnosis not present

## 2021-12-14 DIAGNOSIS — M5412 Radiculopathy, cervical region: Secondary | ICD-10-CM | POA: Diagnosis not present

## 2021-12-14 DIAGNOSIS — M4802 Spinal stenosis, cervical region: Secondary | ICD-10-CM | POA: Diagnosis not present

## 2021-12-14 DIAGNOSIS — Z9889 Other specified postprocedural states: Secondary | ICD-10-CM | POA: Diagnosis not present

## 2021-12-18 ENCOUNTER — Other Ambulatory Visit: Payer: Self-pay

## 2021-12-18 ENCOUNTER — Encounter (HOSPITAL_BASED_OUTPATIENT_CLINIC_OR_DEPARTMENT_OTHER): Payer: Self-pay | Admitting: Emergency Medicine

## 2021-12-18 ENCOUNTER — Emergency Department (HOSPITAL_BASED_OUTPATIENT_CLINIC_OR_DEPARTMENT_OTHER): Payer: BC Managed Care – PPO

## 2021-12-18 ENCOUNTER — Emergency Department (HOSPITAL_BASED_OUTPATIENT_CLINIC_OR_DEPARTMENT_OTHER)
Admission: EM | Admit: 2021-12-18 | Discharge: 2021-12-18 | Disposition: A | Discharge status: Home / Self Care | Payer: BC Managed Care – PPO | Attending: Emergency Medicine | Admitting: Emergency Medicine

## 2021-12-18 DIAGNOSIS — K859 Acute pancreatitis without necrosis or infection, unspecified: Secondary | ICD-10-CM | POA: Diagnosis not present

## 2021-12-18 DIAGNOSIS — N281 Cyst of kidney, acquired: Secondary | ICD-10-CM | POA: Insufficient documentation

## 2021-12-18 DIAGNOSIS — I10 Essential (primary) hypertension: Secondary | ICD-10-CM | POA: Insufficient documentation

## 2021-12-18 DIAGNOSIS — Z79899 Other long term (current) drug therapy: Secondary | ICD-10-CM | POA: Insufficient documentation

## 2021-12-18 DIAGNOSIS — R000 Tachycardia, unspecified: Secondary | ICD-10-CM | POA: Diagnosis not present

## 2021-12-18 DIAGNOSIS — R1013 Epigastric pain: Secondary | ICD-10-CM | POA: Diagnosis not present

## 2021-12-18 DIAGNOSIS — Z7982 Long term (current) use of aspirin: Secondary | ICD-10-CM | POA: Insufficient documentation

## 2021-12-18 DIAGNOSIS — K8020 Calculus of gallbladder without cholecystitis without obstruction: Secondary | ICD-10-CM | POA: Diagnosis not present

## 2021-12-18 DIAGNOSIS — R109 Unspecified abdominal pain: Secondary | ICD-10-CM | POA: Diagnosis not present

## 2021-12-18 DIAGNOSIS — K760 Fatty (change of) liver, not elsewhere classified: Secondary | ICD-10-CM | POA: Diagnosis not present

## 2021-12-18 DIAGNOSIS — K589 Irritable bowel syndrome without diarrhea: Secondary | ICD-10-CM | POA: Diagnosis not present

## 2021-12-18 LAB — COMPREHENSIVE METABOLIC PANEL
ALT: 18 U/L (ref 0–44)
AST: 14 U/L — ABNORMAL LOW (ref 15–41)
Albumin: 4.8 g/dL (ref 3.5–5.0)
Alkaline Phosphatase: 50 U/L (ref 38–126)
Anion gap: 12 (ref 5–15)
BUN: 11 mg/dL (ref 6–20)
CO2: 25 mmol/L (ref 22–32)
Calcium: 10.5 mg/dL — ABNORMAL HIGH (ref 8.9–10.3)
Chloride: 99 mmol/L (ref 98–111)
Creatinine, Ser: 0.74 mg/dL (ref 0.61–1.24)
GFR, Estimated: >60 mL/min (ref >60)
Glucose, Bld: 139 mg/dL — ABNORMAL HIGH (ref 70–99)
Potassium: 4.1 mmol/L (ref 3.5–5.1)
Sodium: 136 mmol/L (ref 135–145)
Total Bilirubin: 0.6 mg/dL (ref 0.3–1.2)
Total Protein: 8.1 g/dL (ref 6.5–8.1)

## 2021-12-18 LAB — CBC
HCT: 44.8 % (ref 39.0–52.0)
Hemoglobin: 14.8 g/dL (ref 13.0–17.0)
MCH: 31.6 pg (ref 26.0–34.0)
MCHC: 33 g/dL (ref 30.0–36.0)
MCV: 95.5 fL (ref 80.0–100.0)
Platelets: 233 10*3/uL (ref 150–400)
RBC: 4.69 MIL/uL (ref 4.22–5.81)
RDW: 13.8 % (ref 11.5–15.5)
WBC: 14.9 10*3/uL — ABNORMAL HIGH (ref 4.0–10.5)
nRBC: 0 % (ref 0.0–0.2)

## 2021-12-18 LAB — URINALYSIS, ROUTINE W REFLEX MICROSCOPIC
Bilirubin Urine: NEGATIVE
Glucose, UA: NEGATIVE mg/dL
Hgb urine dipstick: NEGATIVE
Ketones, ur: NEGATIVE mg/dL
Leukocytes,Ua: NEGATIVE
Nitrite: NEGATIVE
Protein, ur: NEGATIVE mg/dL
Specific Gravity, Urine: 1.031 — ABNORMAL HIGH (ref 1.005–1.030)
pH: 6.5 (ref 5.0–8.0)

## 2021-12-18 LAB — LIPASE, BLOOD: Lipase: 45 U/L (ref 11–51)

## 2021-12-18 MED ORDER — SUCRALFATE 1 G PO TABS: 1.0000 g | ORAL_TABLET | Freq: Three times a day (TID) | ORAL | 0 refills | Status: DC

## 2021-12-18 MED ORDER — DICYCLOMINE HCL 10 MG/5ML PO SOLN: 10.0000 mg | Freq: Once | ORAL | Status: DC

## 2021-12-18 MED ORDER — SODIUM CHLORIDE 0.9 % IV BOLUS
1000.0000 mL | Freq: Once | INTRAVENOUS | Status: AC
  Administered 2021-12-18: 1000 mL via INTRAVENOUS

## 2021-12-18 MED ORDER — FAMOTIDINE IN NACL 20-0.9 MG/50ML-% IV SOLN
20.0000 mg | Freq: Once | INTRAVENOUS | Status: AC
  Administered 2021-12-18: 20 mg via INTRAVENOUS
  Filled 2021-12-18: qty 50

## 2021-12-18 MED ORDER — LIDOCAINE VISCOUS HCL 2 % MT SOLN
15.0000 mL | Freq: Once | OROMUCOSAL | Status: AC
  Administered 2021-12-18: 15 mL via ORAL
  Filled 2021-12-18: qty 15

## 2021-12-18 MED ORDER — ALUM & MAG HYDROXIDE-SIMETH 200-200-20 MG/5ML PO SUSP
30.0000 mL | Freq: Once | ORAL | Status: AC
  Administered 2021-12-18: 30 mL via ORAL
  Filled 2021-12-18: qty 30

## 2021-12-18 MED ORDER — IOHEXOL 350 MG/ML SOLN
100.0000 mL | Freq: Once | INTRAVENOUS | Status: AC | PRN
  Administered 2021-12-18: 85 mL via INTRAVENOUS

## 2021-12-18 MED ORDER — OXYCODONE HCL 5 MG PO TABS: 5.0000 mg | ORAL_TABLET | ORAL | 0 refills | Status: DC | PRN

## 2021-12-18 MED ORDER — ONDANSETRON HCL 4 MG PO TABS: 4.0000 mg | ORAL_TABLET | ORAL | 0 refills | Status: DC | PRN

## 2021-12-18 MED ORDER — DICYCLOMINE HCL 10 MG PO CAPS
10.0000 mg | ORAL_CAPSULE | Freq: Once | ORAL | Status: AC
  Administered 2021-12-18: 10 mg via ORAL
  Filled 2021-12-18: qty 1

## 2021-12-18 NOTE — ED Notes (Signed)
Water and ginger ale provided for PO challenge. ?

## 2021-12-18 NOTE — ED Provider Notes (Signed)
?[facility] ?Provider Note ? ? ?CSN: [id number] ?Arrival date & time: 12/18/21  1457 ? ?  ? ?History ? ?Chief Complaint  ?Patient presents with  ? Abdominal Pain  ? ? ?Robert Schultz is a 56 y.o. male. ? ?This is a 56 y.o. male  with significant medical history as below, including gastritis, hypertension, hyperlipidemia, biliary colic who presents to the ED with complaint of epigastric pain.  Onset was Saturday, epigastrium, right upper quadrant.  Progressively worsening since then.  Seen by GI this morning, told that he had likely gastritis.  Feels pain worsened since being seen by GI this morning and came to the ER for evaluation.  Did not touch base with gastroenterology.  No recent medication or diet changes.  No suspicious oral intake or recent travel.  No trauma.  Prior colonoscopy endoscopy within normal limits per the patient.  No melena.  Nausea without emesis was reported.  Poor appetite. ? ? ? ?Past Medical History: ?No date: AV block ?No date: Cervical radicular pain ?No date: Diverticulitis of colon ?No date: Gastritis ?No date: HTN (hypertension) ?No date: Hyperlipidemia ?No date: Morbid obesity ([place]) ?2004: Normal coronary angiogram ?No date: Palpitations ?No date: Radiculitis of leg ?No date: Sleep apnea ?No date: Thoracic spine pain ? ?Past Surgical History: ?11/17/2002: CARDIAC CATHETERIZATION ?    Comment:  NORMAL. EF 65% ?No date: CERVICAL DISCECTOMY ?10/29/2021: LEFT HEART CATH AND CORONARY ANGIOGRAPHY; N/A ?    Comment:  Procedure: LEFT HEART CATH AND CORONARY ANGIOGRAPHY;   ?             Surgeon: Lorretta Harp, MD;  Location: [facility] ?             LAB;  Service: Cardiovascular;  Laterality: N/A;  ? ? ?The history is provided by the patient. No language interpreter was used.  ?Abdominal Pain ?Associated symptoms: nausea   ?Associated symptoms: no chest pain, no chills, no cough, no fever, no hematuria, no shortness of breath and no vomiting   ? ?  ? ?Home  Medications ?Prior to Admission medications   ?Medication Sig Start Date End Date Taking? Authorizing Provider  ?acetaminophen (TYLENOL) 500 MG tablet Take 1,000 mg by mouth every 6 (six) hours as needed.    [provider]  ?aspirin EC 81 MG tablet Take 81 mg by mouth daily. Swallow whole.    [provider]  ?Cyanocobalamin (B-12 COMPLIANCE INJECTION IJ) Inject 1 Dose as directed every Tuesday.    [provider]  ?glipiZIDE (GLUCOTROL XL) 5 MG 24 hr tablet Take 5 mg by mouth daily.    [provider]  ?metFORMIN (GLUCOPHAGE-XR) 500 MG 24 hr tablet Take 1,000 mg by mouth 2 (two) times daily. 12/12/16   [provider]  ?metoprolol succinate (TOPROL XL) 25 MG 24 hr tablet Take 1 tablet (25 mg total) by mouth daily. ?Patient taking differently: Take 25 mg by mouth daily as needed (pvc flare). 09/19/21   Lorretta Harp, MD  ?Multiple Vitamin (MULTIVITAMIN WITH MINERALS) TABS tablet Take 1 tablet by mouth daily.    [provider]  ?pantoprazole (PROTONIX) 40 MG tablet 1 TABLET ONCE A DAY TAKE 30 MINUTES PRIOR TO BREAKFAST.    [provider]  ?Peppermint Oil (IBGARD) 90 MG CPCR Take 2 capsules by mouth 2 (two) times daily. 10/02/21   [provider]  ?Probiotic Product (ALIGN) 4 MG CAPS Take 4 mg by mouth daily. 10/02/21   [provider]  ?rosuvastatin (CRESTOR) 20 MG tablet TAKE 1 TABLET (20 MG TOTAL) BY MOUTH DAILY. KEEP UPCOMING APPOINTMENT 06/06/21   Pixie Casino, MD  ?sildenafil (REVATIO) 20 MG tablet Take 40 mg by mouth as needed (ED). 04/04/21   [provider]  ?sucralfate (CARAFATE) 1 g tablet Take 1 g by mouth 3 (three) times daily. 09/17/21   [provider]  ?   ? ?Allergies    ?Hydrocodone, Penicillins, Atorvastatin, Dihydrocodeine, Acetaminophen-codeine, and Codeine   ? ?Review of Systems   ?Review of Systems  ?Constitutional:  Positive for appetite change. Negative for chills and fever.  ?HENT:   Negative for facial swelling and trouble swallowing.   ?Eyes:  Negative for photophobia and visual disturbance.  ?Respiratory:  Negative for cough and shortness of breath.   ?Cardiovascular:  Negative for chest pain and palpitations.  ?Gastrointestinal:  Positive for abdominal pain and nausea. Negative for vomiting.  ?Endocrine: Negative for polydipsia and polyuria.  ?Genitourinary:  Negative for difficulty urinating and hematuria.  ?Musculoskeletal:  Negative for gait problem and joint swelling.  ?Skin:  Negative for pallor and rash.  ?Neurological:  Negative for syncope and headaches.  ?Psychiatric/Behavioral:  Negative for agitation and confusion.   ? ?Physical Exam ?Updated Vital Signs ?BP 122/85   Pulse 86   Temp 98.5 ?F (36.9 ?C) (Oral)   Resp 19   Ht '5\' 8"'$  (1.727 m)   Wt 101.6 kg   SpO2 100%   BMI 34.06 kg/m?  ?Physical Exam ?Vitals and nursing note reviewed.  ?Constitutional:   ?   General: He is not in acute distress. ?   Appearance: He is well-developed. He is not diaphoretic.  ?HENT:  ?   Head: Normocephalic and atraumatic.  ?   Right Ear: External ear normal.  ?   Left Ear: External ear normal.  ?   Mouth/Throat:  ?   Mouth: Mucous membranes are moist.  ?Eyes:  ?   General: No scleral icterus. ?Cardiovascular:  ?   Rate and Rhythm: Normal rate and regular rhythm.  ?   Pulses: Normal pulses.  ?   Heart sounds: Normal heart sounds.  ?Pulmonary:  ?   Effort: Pulmonary effort is normal. No respiratory distress.  ?   Breath sounds: Normal breath sounds.  ?Abdominal:  ?   General: Abdomen is flat. There is no distension.  ?   Palpations: Abdomen is soft. There is no pulsatile mass.  ?   Tenderness: There is abdominal tenderness in the right upper quadrant and epigastric area. There is no guarding or rebound. Negative signs include Murphy's sign.  ?Musculoskeletal:     ?   General: Normal range of motion.  ?   Cervical back: Normal range of motion.  ?   Right lower leg: No edema.  ?   Left lower leg: No  edema.  ?Skin: ?   General: Skin is warm and dry.  ?   Capillary Refill: Capillary refill takes less than 2 seconds.  ?Neurological:  ?   Mental Status: He is alert and oriented to person, place, and time.  ?Psychiatric:     ?   Mood and Affect: Mood normal.     ?   Behavior: Behavior normal.  ? ? ?ED Results / Procedures / Treatments   ?Labs ?(all labs ordered are listed, but only abnormal results are displayed) ?Labs Reviewed  ?COMPREHENSIVE METABOLIC PANEL - Abnormal; Notable for the following components:  ?    Result  Value  ? Glucose, Bld 139 (*)   ? Calcium 10.5 (*)   ? AST 14 (*)   ? All other components within normal limits  ?CBC - Abnormal; Notable for the following components:  ? WBC 14.9 (*)   ? All other components within normal limits  ?URINALYSIS, ROUTINE W REFLEX MICROSCOPIC - Abnormal; Notable for the following components:  ? Color, Urine COLORLESS (*)   ? Specific Gravity, Urine 1.031 (*)   ? All other components within normal limits  ?LIPASE, BLOOD  ? ? ?EKG ?EKG Interpretation ? ?Date/Time:  Tuesday December 18 2021 16:16:28 EDT ?Ventricular Rate:  82 ?PR Interval:  156 ?QRS Duration: 93 ?QT Interval:  374 ?QTC Calculation: 437 ?R Axis:   -26 ?Text Interpretation: Sinus rhythm Borderline left axis deviation Abnormal R-wave progression, late transition similar to prior tracing no stemi Confirmed by Wynona Dove ([id number]) on 12/18/2021 4:21:30 PM ? ?Radiology ?CT ABDOMEN PELVIS W CONTRAST ? ?Result Date: 12/18/2021 ?CLINICAL DATA:  Abdominal pain EXAM: CT ABDOMEN AND PELVIS WITH CONTRAST TECHNIQUE: Multidetector CT imaging of the abdomen and pelvis was performed using the standard protocol following bolus administration of intravenous contrast. RADIATION DOSE REDUCTION: This exam was performed according to the departmental dose-optimization program which includes automated exposure control, adjustment of the mA and/or kV according to patient size and/or use of iterative reconstruction technique. CONTRAST:   39m OMNIPAQUE IOHEXOL 350 MG/ML SOLN COMPARISON:  09/26/2021 FINDINGS: Lower chest: There is small focus of increased interstitial markings in the posterior left lower lung fields which was not seen in the previous s

## 2021-12-18 NOTE — Discharge Instructions (Addendum)
It was a pleasure caring for you today in the emergency department. ° °Please return to the emergency department for any worsening or worrisome symptoms. ° ° °

## 2021-12-18 NOTE — ED Notes (Signed)
RN provided AVS using Teachback Method. Patient verbalizes understanding of Discharge Instructions. Opportunity for Questioning and Answers were provided by RN. Patient Discharged from ED ambulatory to Home via Self.  

## 2021-12-18 NOTE — ED Triage Notes (Signed)
Pt via pov from home with abdominal pain x 3 days. Pt states he was seen at gastroenterologist this morning and they felt like it was a stomach lining issue, but pt states he has had that before and this is different. Pt shows pain to upper mid abdomen and states it radiates down to lower abdomen and to back. States his torso is "sore." Pt has hx of pancreatitis, as well as gallstones. Pt alert & oriented, nad noted.  ?

## 2021-12-19 DIAGNOSIS — G4733 Obstructive sleep apnea (adult) (pediatric): Secondary | ICD-10-CM | POA: Diagnosis not present

## 2021-12-20 DIAGNOSIS — R1010 Upper abdominal pain, unspecified: Secondary | ICD-10-CM | POA: Diagnosis not present

## 2021-12-20 DIAGNOSIS — K760 Fatty (change of) liver, not elsewhere classified: Secondary | ICD-10-CM | POA: Diagnosis not present

## 2021-12-20 DIAGNOSIS — K589 Irritable bowel syndrome without diarrhea: Secondary | ICD-10-CM | POA: Diagnosis not present

## 2022-01-08 DIAGNOSIS — K8020 Calculus of gallbladder without cholecystitis without obstruction: Secondary | ICD-10-CM | POA: Diagnosis not present

## 2022-01-08 DIAGNOSIS — K760 Fatty (change of) liver, not elsewhere classified: Secondary | ICD-10-CM | POA: Diagnosis not present

## 2022-01-11 ENCOUNTER — Other Ambulatory Visit: Payer: Self-pay | Admitting: Surgery

## 2022-01-11 DIAGNOSIS — K8020 Calculus of gallbladder without cholecystitis without obstruction: Secondary | ICD-10-CM | POA: Diagnosis not present

## 2022-01-15 DIAGNOSIS — K589 Irritable bowel syndrome without diarrhea: Secondary | ICD-10-CM | POA: Diagnosis not present

## 2022-01-15 DIAGNOSIS — K8020 Calculus of gallbladder without cholecystitis without obstruction: Secondary | ICD-10-CM | POA: Diagnosis not present

## 2022-01-15 DIAGNOSIS — K2970 Gastritis, unspecified, without bleeding: Secondary | ICD-10-CM | POA: Diagnosis not present

## 2022-01-15 DIAGNOSIS — K760 Fatty (change of) liver, not elsewhere classified: Secondary | ICD-10-CM | POA: Diagnosis not present

## 2022-01-29 DIAGNOSIS — E538 Deficiency of other specified B group vitamins: Secondary | ICD-10-CM | POA: Diagnosis not present

## 2022-01-30 DIAGNOSIS — D2261 Melanocytic nevi of right upper limb, including shoulder: Secondary | ICD-10-CM | POA: Diagnosis not present

## 2022-01-30 DIAGNOSIS — D2262 Melanocytic nevi of left upper limb, including shoulder: Secondary | ICD-10-CM | POA: Diagnosis not present

## 2022-01-30 DIAGNOSIS — D2271 Melanocytic nevi of right lower limb, including hip: Secondary | ICD-10-CM | POA: Diagnosis not present

## 2022-01-30 DIAGNOSIS — D2272 Melanocytic nevi of left lower limb, including hip: Secondary | ICD-10-CM | POA: Diagnosis not present

## 2022-02-07 DIAGNOSIS — G4733 Obstructive sleep apnea (adult) (pediatric): Secondary | ICD-10-CM | POA: Diagnosis not present

## 2022-02-21 ENCOUNTER — Other Ambulatory Visit: Payer: Self-pay

## 2022-02-21 MED ORDER — ROSUVASTATIN CALCIUM 20 MG PO TABS: 20.0000 mg | ORAL_TABLET | Freq: Every day | ORAL | 3 refills | Status: AC

## 2022-02-21 NOTE — Telephone Encounter (Signed)
Pt walked in and stated that his pharmacy told him that we denied his refill. Front desk will tell him I have sent it.

## 2022-03-01 DIAGNOSIS — E538 Deficiency of other specified B group vitamins: Secondary | ICD-10-CM | POA: Diagnosis not present

## 2022-03-10 DIAGNOSIS — G4733 Obstructive sleep apnea (adult) (pediatric): Secondary | ICD-10-CM | POA: Diagnosis not present

## 2022-03-15 ENCOUNTER — Other Ambulatory Visit: Payer: Self-pay | Admitting: Cardiovascular Disease

## 2022-04-09 DIAGNOSIS — Z0000 Encounter for general adult medical examination without abnormal findings: Secondary | ICD-10-CM | POA: Diagnosis not present

## 2022-04-09 DIAGNOSIS — E1165 Type 2 diabetes mellitus with hyperglycemia: Secondary | ICD-10-CM | POA: Diagnosis not present

## 2022-04-09 DIAGNOSIS — G4733 Obstructive sleep apnea (adult) (pediatric): Secondary | ICD-10-CM | POA: Diagnosis not present

## 2022-04-09 DIAGNOSIS — Z125 Encounter for screening for malignant neoplasm of prostate: Secondary | ICD-10-CM | POA: Diagnosis not present

## 2022-04-09 DIAGNOSIS — Z5181 Encounter for therapeutic drug level monitoring: Secondary | ICD-10-CM | POA: Diagnosis not present

## 2022-04-09 DIAGNOSIS — E785 Hyperlipidemia, unspecified: Secondary | ICD-10-CM | POA: Diagnosis not present

## 2022-04-11 ENCOUNTER — Other Ambulatory Visit: Payer: Self-pay | Admitting: Urology

## 2022-04-11 DIAGNOSIS — D3001 Benign neoplasm of right kidney: Secondary | ICD-10-CM

## 2022-04-29 ENCOUNTER — Ambulatory Visit
Admission: RE | Admit: 2022-04-29 | Discharge: 2022-04-29 | Disposition: A | Discharge status: Home / Self Care | Payer: BC Managed Care – PPO | Source: Ambulatory Visit | Attending: Urology | Admitting: Urology

## 2022-04-29 DIAGNOSIS — D3001 Benign neoplasm of right kidney: Secondary | ICD-10-CM

## 2022-04-29 DIAGNOSIS — K760 Fatty (change of) liver, not elsewhere classified: Secondary | ICD-10-CM | POA: Diagnosis not present

## 2022-04-29 DIAGNOSIS — N281 Cyst of kidney, acquired: Secondary | ICD-10-CM | POA: Diagnosis not present

## 2022-04-29 DIAGNOSIS — K8020 Calculus of gallbladder without cholecystitis without obstruction: Secondary | ICD-10-CM | POA: Diagnosis not present

## 2022-04-29 MED ORDER — GADOBENATE DIMEGLUMINE 529 MG/ML IV SOLN
20.0000 mL | Freq: Once | INTRAVENOUS | Status: AC | PRN
  Administered 2022-04-29: 20 mL via INTRAVENOUS

## 2022-05-06 DIAGNOSIS — N281 Cyst of kidney, acquired: Secondary | ICD-10-CM | POA: Diagnosis not present

## 2022-06-11 DIAGNOSIS — Z23 Encounter for immunization: Secondary | ICD-10-CM | POA: Diagnosis not present

## 2022-07-01 DIAGNOSIS — E785 Hyperlipidemia, unspecified: Secondary | ICD-10-CM | POA: Diagnosis not present

## 2022-07-01 DIAGNOSIS — E1165 Type 2 diabetes mellitus with hyperglycemia: Secondary | ICD-10-CM | POA: Diagnosis not present

## 2022-07-01 DIAGNOSIS — I10 Essential (primary) hypertension: Secondary | ICD-10-CM | POA: Diagnosis not present

## 2022-07-01 DIAGNOSIS — E538 Deficiency of other specified B group vitamins: Secondary | ICD-10-CM | POA: Diagnosis not present

## 2022-07-16 DIAGNOSIS — Z23 Encounter for immunization: Secondary | ICD-10-CM | POA: Diagnosis not present

## 2022-07-16 DIAGNOSIS — E538 Deficiency of other specified B group vitamins: Secondary | ICD-10-CM | POA: Diagnosis not present

## 2022-07-16 DIAGNOSIS — E1165 Type 2 diabetes mellitus with hyperglycemia: Secondary | ICD-10-CM | POA: Diagnosis not present

## 2022-07-31 DIAGNOSIS — E538 Deficiency of other specified B group vitamins: Secondary | ICD-10-CM | POA: Diagnosis not present

## 2022-08-14 DIAGNOSIS — Z23 Encounter for immunization: Secondary | ICD-10-CM | POA: Diagnosis not present

## 2022-09-02 DIAGNOSIS — E538 Deficiency of other specified B group vitamins: Secondary | ICD-10-CM | POA: Diagnosis not present

## 2022-09-18 DIAGNOSIS — G4733 Obstructive sleep apnea (adult) (pediatric): Secondary | ICD-10-CM | POA: Diagnosis not present

## 2022-11-26 ENCOUNTER — Encounter: Payer: Self-pay | Admitting: Nurse Practitioner

## 2022-11-26 ENCOUNTER — Ambulatory Visit: Payer: Self-pay | Attending: Nurse Practitioner | Admitting: Nurse Practitioner

## 2022-11-26 VITALS — BP 138/80 | HR 79 | Ht 68.0 in | Wt 234.0 lb

## 2022-11-26 DIAGNOSIS — E785 Hyperlipidemia, unspecified: Secondary | ICD-10-CM

## 2022-11-26 DIAGNOSIS — I2510 Atherosclerotic heart disease of native coronary artery without angina pectoris: Secondary | ICD-10-CM

## 2022-11-26 DIAGNOSIS — R002 Palpitations: Secondary | ICD-10-CM

## 2022-11-26 DIAGNOSIS — I10 Essential (primary) hypertension: Secondary | ICD-10-CM

## 2022-11-26 DIAGNOSIS — E119 Type 2 diabetes mellitus without complications: Secondary | ICD-10-CM

## 2022-11-26 NOTE — Patient Instructions (Addendum)
Medication Instructions:  Your physician recommends that you continue on your current medications as directed. Please refer to the Current Medication list given to you today.   *If you need a refill on your cardiac medications before your next appointment, please call your pharmacy*   Lab Work: NONE ordered at this time of appointment   If you have labs (blood work) drawn today and your tests are completely normal, you will receive your results only by: [place] (if you have MyChart) OR A paper copy in the mail If you have any lab test that is abnormal or we need to change your treatment, we will call you to review the results.   Testing/Procedures: NONE ordered at this time of appointment     Follow-Up: At [facility], you and your health needs are our priority.  As part of our continuing mission to provide you with exceptional heart care, we have created designated Provider Care Teams.  These Care Teams include your primary Cardiologist (physician) and Advanced Practice Providers (APPs -  Physician Assistants and Nurse Practitioners) who all work together to provide you with the care you need, when you need it.  We recommend signing up for the patient portal called "MyChart".  Sign up information is provided on this After Visit Summary.  MyChart is used to connect with patients for Virtual Visits (Telemedicine).  Patients are able to view lab/test results, encounter notes, upcoming appointments, etc.  Non-urgent messages can be sent to your provider as well.   To learn more about what you can do with MyChart, go to [email or web address].    Your next appointment:   1 year(s)  Provider:   Quay Burow, MD     Other Instructions

## 2022-11-26 NOTE — Progress Notes (Signed)
Office Visit    Patient Name: Robert Schultz Date of Encounter: 11/26/2022  Primary Care Provider:  London Pepper, MD Primary Cardiologist:  Quay Burow, MD  Chief Complaint    57 year old male with a history of CAD, PVCs, hypertension, hyperlipidemia, type 2 diabetes, OSA, and obesity who presents for follow-up related to CAD.  Past Medical History    Past Medical History:  Diagnosis Date   AV block    Cervical radicular pain    Diverticulitis of colon    Gastritis    HTN (hypertension)    Hyperlipidemia    Morbid obesity ([place])    Normal coronary angiogram 2004   Palpitations    Radiculitis of leg    Sleep apnea    Thoracic spine pain    Past Surgical History:  Procedure Laterality Date   CARDIAC CATHETERIZATION  11/17/2002   NORMAL. EF 65%   CERVICAL DISCECTOMY     LEFT HEART CATH AND CORONARY ANGIOGRAPHY N/A 10/29/2021   Procedure: LEFT HEART CATH AND CORONARY ANGIOGRAPHY;  Surgeon: Lorretta Harp, MD;  Location: [facility];  Service: Cardiovascular;  Laterality: N/A;    Allergies  Allergies  Allergen Reactions   Hydrocodone Other (See Comments)    "I break into a violent sweat--fast."   Penicillins     Profuse sweating Swelling of the face/tongue/throat, SOB, or low BP? N Sudden or severe rash/hives, skin peeling, or the inside of the mouth or nose? n Did it require medical treatment? N When did it last happen?      unk If all above answers are "NO", may proceed with cephalosporin use.    Atorvastatin     Restlessness    Dihydrocodeine     Sweating    Acetaminophen-Codeine Rash   Codeine Rash     Labs/Other Studies Reviewed    The following studies were reviewed today: [place] 2021/11/07:    Ost LM to Mid LM lesion is 40% stenosed.   Mid LAD lesion is 40% stenosed.   Mid Cx lesion is 45% stenosed.   The left ventricular systolic function is normal.   LV end diastolic pressure is normal.   The left ventricular ejection fraction is  50-55% by visual estimate.  Recent Labs: 12/18/2021: ALT 18; BUN 11; Creatinine, Ser 0.74; Hemoglobin 14.8; Platelets 233; Potassium 4.1; Sodium 136  Recent Lipid Panel    Component Value Date/Time   CHOL 127 09/15/2018 1202   TRIG 122 09/15/2018 1202   HDL 37 (L) 09/15/2018 1202   CHOLHDL 3.4 09/15/2018 1202   VLDL 24 09/15/2018 1202   LDLCALC 66 09/15/2018 1202   LDLDIRECT 152.5 11/19/2012 0900    History of Present Illness    57 year old male with the above past medical history including CAD, PVCs, hypertension, hyperlipidemia, type 2 diabetes, OSA, and obesity.  He was previously evaluated by Dr. Doreatha Lew.  He has a history of prior Holter monitor that showed PVCs and abnormal cardiac cath.  Coronary calcium score in 2020 was 533.  Strength in 08/2019 was nonischemic.  He is followed with Dr. Debara Pickett for lipid management. Coronary calcium score in 2021-11-07 revealed calcium score of 1059, CCTA FFR suggested three-vessel disease.  Outpatient cardiac catheterization on 10/29/2021 revealed noncritical CAD. In retrospect, he felt that his chest pain was likely and related to stress.  He was last seen in the office on 11/20/2021 and was stable from a cardiac standpoint.  He denied symptoms concerning for angina.  He  presents today for follow-up.  Since his last visit he has done well from a cardiac standpoint.  He notes occasional fleeting palpitations, denies any symptoms concerning for angina.  He is busy preparing for his oldest daughter to attend college.  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  He remains active, continues to walk his dogs regularly.  Overall, he reports feeling well.  Home Medications    Current Outpatient Medications  Medication Sig Dispense Refill   acetaminophen (TYLENOL) 500 MG tablet Take 1,000 mg by mouth every 6 (six) hours as needed.     aspirin EC 81 MG tablet Take 81 mg by mouth daily. Swallow whole.     Cyanocobalamin (B-12 COMPLIANCE INJECTION IJ)  Inject 1 Dose as directed every 30 (thirty) days.     glipiZIDE (GLUCOTROL XL) 5 MG 24 hr tablet Take 5 mg by mouth daily.     metFORMIN (GLUCOPHAGE-XR) 500 MG 24 hr tablet Take 1,000 mg by mouth 2 (two) times daily.  0   Multiple Vitamin (MULTIVITAMIN WITH MINERALS) TABS tablet Take 1 tablet by mouth daily.     pantoprazole (PROTONIX) 40 MG tablet 1 TABLET ONCE A DAY TAKE 30 MINUTES PRIOR TO BREAKFAST.     Peppermint Oil (IBGARD) 90 MG CPCR Take 2 capsules by mouth 2 (two) times daily.     rosuvastatin (CRESTOR) 20 MG tablet Take 1 tablet (20 mg total) by mouth daily. Keep upcoming appointment 90 tablet 3   sildenafil (REVATIO) 20 MG tablet Take 40 mg by mouth as needed (ED).     sucralfate (CARAFATE) 1 g tablet Take 1 g by mouth 3 (three) times daily.     VITAMIN D, ERGOCALCIFEROL, PO Take 1 Capful by mouth daily.     Current Facility-Administered Medications  Medication Dose Route Frequency Provider Last Rate Last Admin   sodium chloride flush (NS) 0.9 % injection 3 mL  3 mL Intravenous Q12H Lorretta Harp, MD         Review of Systems    He denies chest pain, dyspnea, pnd, orthopnea, n, v, dizziness, syncope, edema, weight gain, or early satiety. All other systems reviewed and are otherwise negative except as noted above.   Physical Exam    VS:  BP 138/80 (BP Location: Left Arm, Patient Position: Sitting, Cuff Size: Large)   Pulse 79   Ht '5\' 8"'$  (1.727 m)   Wt 234 lb (106.1 kg)   BMI 35.58 kg/m   GEN: Well nourished, well developed, in no acute distress. HEENT: normal. Neck: Supple, no JVD, carotid bruits, or masses. Cardiac: RRR, no murmurs, rubs, or gallops. No clubbing, cyanosis, edema.  Radials/DP/PT 2+ and equal bilaterally.  Respiratory:  Respirations regular and unlabored, clear to auscultation bilaterally. GI: Soft, nontender, nondistended, BS + x 4. MS: no deformity or atrophy. Skin: warm and dry, no rash. Neuro:  Strength and sensation are intact. Psych: Normal  affect.  Accessory Clinical Findings    ECG personally reviewed by me today -NSR, 79 bpm- no acute changes.   Lab Results  Component Value Date   WBC 14.9 (H) 12/18/2021   HGB 14.8 12/18/2021   HCT 44.8 12/18/2021   MCV 95.5 12/18/2021   PLT 233 12/18/2021   Lab Results  Component Value Date   CREATININE 0.74 12/18/2021   BUN 11 12/18/2021   NA 136 12/18/2021   K 4.1 12/18/2021   CL 99 12/18/2021   CO2 25 12/18/2021   Lab Results  Component Value Date   ALT 18 12/18/2021   AST 14 (L) 12/18/2021   ALKPHOS 50 12/18/2021   BILITOT 0.6 12/18/2021   Lab Results  Component Value Date   CHOL 127 09/15/2018   HDL 37 (L) 09/15/2018   LDLCALC 66 09/15/2018   LDLDIRECT 152.5 11/19/2012   TRIG 122 09/15/2018   CHOLHDL 3.4 09/15/2018    Lab Results  Component Value Date   HGBA1C 9.0 (H) 09/16/2018    Assessment & Plan    1. CAD: Coronary calcium score in 10/2021 revealed calcium score of 1059, CCTA FFR suggested three-vessel disease.  Outpatient cardiac catheterization on 10/29/2021 revealed noncritical CAD.  Stable with no anginal symptoms.  Dictation for ischemic evaluation.  Continue aspirin, metoprolol, Crestor.  2. PVCs: Prior Holter monitor showed PVCs.  He notes occasional fleeting palpitations usually in the setting of increased stress.  Stable on metoprolol.  3. Hypertension: BP well controlled. Continue current antihypertensive regimen.   4. Hyperlipidemia: LDL was 70 in 09/2021.  Continue Crestor.  5. Type 2 diabetes: A1c was 7.0 in 10/2022.  Monitored and managed per PCP.  6. Disposition: Follow-up in 1 year with Dr. Gwenlyn Found.      Lenna Sciara, NP 11/26/2022, 8:42 AM

## 2023-03-23 NOTE — Progress Notes (Unsigned)
Cardiology Clinic Note   Patient Name: Robert Schultz Date of Encounter: 03/25/2023  Primary Care Provider:  Farris Has, MD Primary Cardiologist:  Nanetta Batty, MD  Patient Profile    57 year old male with a history of CAD, PVCs, hypertension, hyperlipidemia, type 2 diabetes, OSA, and obesity. He is followed with Dr. Rennis Golden for lipid management.   Past Medical History    Past Medical History:  Diagnosis Date   AV block    Cervical radicular pain    Diverticulitis of colon    Gastritis    HTN (hypertension)    Hyperlipidemia    Morbid obesity ([facility])    Normal coronary angiogram 2004   Palpitations    Radiculitis of leg    Sleep apnea    Thoracic spine pain    Past Surgical History:  Procedure Laterality Date   CARDIAC CATHETERIZATION  11/17/2002   NORMAL. EF 65%   CERVICAL DISCECTOMY     LEFT HEART CATH AND CORONARY ANGIOGRAPHY N/A 10/29/2021   Procedure: LEFT HEART CATH AND CORONARY ANGIOGRAPHY;  Surgeon: Runell Gess, MD;  Location: [facility];  Service: Cardiovascular;  Laterality: N/A;    Allergies  Allergies  Allergen Reactions   Hydrocodone Other (See Comments)    "I break into a violent sweat--fast."   Penicillins     Profuse sweating Swelling of the face/tongue/throat, SOB, or low BP? N Sudden or severe rash/hives, skin peeling, or the inside of the mouth or nose? n Did it require medical treatment? N When did it last happen?      unk If all above answers are "NO", may proceed with cephalosporin use.    Atorvastatin     Restlessness    Dihydrocodeine     Sweating    Acetaminophen-Codeine Rash   Codeine Rash    History of Present Illness    [name] comes today with complaints of worsening PVC's, occurring with more frequency and duration.  Sometimes they occur causing him to have pain and soreness in his chest. He states he has begun to take metoprolol XL 25 mg for the last 10 days.  He states that although he continues to have  extra palpitations, they have gotten some better back on the metoprolol.  However there are days even though he takes metoprolol but his  palpitations increase again.  He notices mostly when he is in a reclining position, or a sitting position.  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  He denies significant caffeine use, alcohol use, tobacco, or energy products.  Home Medications    Current Outpatient Medications  Medication Sig Dispense Refill   acetaminophen (TYLENOL) 500 MG tablet Take 1,000 mg by mouth every 6 (six) hours as needed.     aspirin EC 81 MG tablet Take 81 mg by mouth daily. Swallow whole.     Cyanocobalamin (B-12 COMPLIANCE INJECTION IJ) Inject 1 Dose as directed every 30 (thirty) days.     glipiZIDE (GLUCOTROL XL) 5 MG 24 hr tablet Take 5 mg by mouth daily.     metFORMIN (GLUCOPHAGE-XR) 500 MG 24 hr tablet Take 1,000 mg by mouth 2 (two) times daily.  0   Multiple Vitamin (MULTIVITAMIN WITH MINERALS) TABS tablet Take 1 tablet by mouth daily.     pantoprazole (PROTONIX) 40 MG tablet 1 TABLET ONCE A DAY TAKE 30 MINUTES PRIOR TO BREAKFAST.     Peppermint Oil (IBGARD) 90  MG CPCR Take 2 capsules by mouth 2 (two) times daily.     rosuvastatin (CRESTOR) 20 MG tablet Take 1 tablet (20 mg total) by mouth daily. Keep upcoming appointment 90 tablet 3   sildenafil (REVATIO) 20 MG tablet Take 40 mg by mouth as needed (ED).     sucralfate (CARAFATE) 1 g tablet Take 1 g by mouth 3 (three) times daily.     VITAMIN D, ERGOCALCIFEROL, PO Take 1 Capful by mouth daily.     Current Facility-Administered Medications  Medication Dose Route Frequency Provider Last Rate Last Admin   sodium chloride flush (NS) 0.9 % injection 3 mL  3 mL Intravenous Q12H Runell Gess, MD         Family History    Family History  Problem Relation Age of Onset   Multiple sclerosis Mother    Hypertension Father    Cancer Father    He indicated that his  mother is deceased. He indicated that his father is deceased. He indicated that his sister is alive. He indicated that his brother is alive.  Social History    Social History   Socioeconomic History   Marital status: Married    Spouse name: Not on file   Number of children: Not on file   Years of education: Not on file   Highest education level: Not on file  Occupational History   Not on file  Tobacco Use   Smoking status: Never   Smokeless tobacco: Never  Vaping Use   Vaping Use: Never used  Substance and Sexual Activity   Alcohol use: No   Drug use: No   Sexual activity: Yes  Other Topics Concern   Not on file  Social History Narrative   Not on file   Social Determinants of Health   Financial Resource Strain: Not on file  Food Insecurity: Not on file  Transportation Needs: Not on file  Physical Activity: Not on file  Stress: Not on file  Social Connections: Not on file  Intimate Partner Violence: Not on file     Review of Systems    General:  No chills, fever, night sweats or weight changes.  Cardiovascular:  No chest pain, dyspnea on exertion, edema, orthopnea, palpitations, paroxysmal nocturnal dyspnea. Dermatological: No rash, lesions/masses Respiratory: No cough, dyspnea Urologic: No hematuria, dysuria Abdominal:   No nausea, vomiting, diarrhea, bright red blood per rectum, melena, or hematemesis Neurologic:  No visual changes, wkns, changes in mental status. All other systems reviewed and are otherwise negative except as noted above.       Physical Exam    VS:  BP 128/76   Pulse 66   Ht 5\' 8"  (1.727 m)   Wt 233 lb 3.2 oz (105.8 kg)   SpO2 99%   BMI 35.46 kg/m  , BMI Body mass index is 35.46 kg/m.     GEN: Well nourished, well developed, in no acute distress. HEENT: normal. Neck: Supple, no JVD, carotid bruits, or masses. Cardiac: RRR, no murmurs, rubs, or gallops. No clubbing, cyanosis, edema.  Radials/DP/PT 2+ and equal bilaterally.   Respiratory:  Respirations regular and unlabored, clear to auscultation bilaterally. GI: Soft, nontender, nondistended, BS + x 4.  Mild central obesity MS: no deformity or atrophy. Skin: warm and dry, no rash. Neuro:  Strength and sensation are intact. Psych: Normal affect.      Lab Results  Component Value Date   WBC 14.9 (H) 12/18/2021   HGB 14.8 12/18/2021  HCT 44.8 12/18/2021   MCV 95.5 12/18/2021   PLT 233 12/18/2021   Lab Results  Component Value Date   CREATININE 0.74 12/18/2021   BUN 11 12/18/2021   NA 136 12/18/2021   K 4.1 12/18/2021   CL 99 12/18/2021   CO2 25 12/18/2021   Lab Results  Component Value Date   ALT 18 12/18/2021   AST 14 (L) 12/18/2021   ALKPHOS 50 12/18/2021   BILITOT 0.6 12/18/2021   Lab Results  Component Value Date   CHOL 127 09/15/2018   HDL 37 (L) 09/15/2018   LDLCALC 66 09/15/2018   LDLDIRECT 152.5 11/19/2012   TRIG 122 09/15/2018   CHOLHDL 3.4 09/15/2018    Lab Results  Component Value Date   HGBA1C 9.0 (H) 09/16/2018     Review of Prior Studies   LHC 10/2021:    Ost LM to Mid LM lesion is 40% stenosed.   Mid LAD lesion is 40% stenosed.   Mid Cx lesion is 45% stenosed.   The left ventricular systolic function is normal.   LV end diastolic pressure is normal.   The left ventricular ejection fraction is 50-55% by visual estimate.   Assessment & Plan   1.  Frequent palpitations: History of PVCs, some help with metoprolol 25 mg XL daily, but continues to have breakthrough palpitations.  They have been worse recently.  I will check a 2  week ZIO monitor to evaluate his heart rate, and burden of palpitations to evaluate for frequency of PVCs, NSVT, or other arrhythmias causing his symptoms.  If he has significant burden despite use of metoprolol, may need to consider EP referral for PVC ablation at the discretion of Dr. Allyson Sabal.  Due to low normal blood pressure would not want to increase his metoprolol to prevent  hypotension.  2.  Coronary artery disease: Nonobstructive per cardiac catheterization 10/29/2021 as discussed above.  Secondary prevention with blood pressure control, cholesterol control, weight loss, purposeful exercise, and Mediterranean diet.  3.  Hypercholesterolemia: Remains on rosuvastatin 20 mg daily.  Goal of LDL less than 70 to prevent progression of CAD.  4.  Type 2 diabetes: Followed by PCP.  5.  GERD: On Carafate and pantoprazole.  May need to consider magnesium replacement which may help with palpitations as well.       Signed, Bettey Mare. Liborio Nixon, ANP, AACC   03/25/2023 4:09 PM      Office [phone] Fax [phone]  Notice: This dictation was prepared with Dragon dictation along with smaller phrase technology. Any transcriptional errors that result from this process are unintentional and may not be corrected upon review.

## 2023-03-25 ENCOUNTER — Ambulatory Visit: Payer: BC Managed Care – PPO | Attending: Adult Health | Admitting: Adult Health

## 2023-03-25 ENCOUNTER — Ambulatory Visit: Payer: BC Managed Care – PPO | Attending: Adult Health

## 2023-03-25 ENCOUNTER — Encounter: Payer: Self-pay | Admitting: Adult Health

## 2023-03-25 VITALS — BP 128/76 | HR 66 | Ht 68.0 in | Wt 233.2 lb

## 2023-03-25 DIAGNOSIS — I10 Essential (primary) hypertension: Secondary | ICD-10-CM

## 2023-03-25 DIAGNOSIS — E7800 Pure hypercholesterolemia, unspecified: Secondary | ICD-10-CM

## 2023-03-25 DIAGNOSIS — R002 Palpitations: Secondary | ICD-10-CM

## 2023-03-25 NOTE — Progress Notes (Unsigned)
Enrolled patient for a 14 day ZIo XT monitor to be mailed to patients home Robert Schultz to read

## 2023-03-25 NOTE — Patient Instructions (Signed)
Medication Instructions:  No Changes *If you need a refill on your cardiac medications before your next appointment, please call your pharmacy*   Lab Work: No Labs If you have labs (blood work) drawn today and your tests are completely normal, you will receive your results only by: [place] (if you have MyChart) OR A paper copy in the mail If you have any lab test that is abnormal or we need to change your treatment, we will call you to review the results.   Testing/Procedures: ZIO AT Long term monitor-Live Telemetry  Your physician has requested you wear a ZIO patch monitor for 14 days.  This is a single patch monitor. [employer] supplies one patch monitor per enrollment. Additional  stickers are not available.  Please do not apply patch if you will be having a Nuclear Stress Test, Echocardiogram, Cardiac CT, MRI,  or Chest Xray during the period you would be wearing the monitor. The patch cannot be worn during  these tests. You cannot remove and re-apply the ZIO AT patch monitor.  Your ZIO patch monitor will be mailed 3 day USPS to your address on file. It may take 3-5 days to  receive your monitor after you have been enrolled.  Once you have received your monitor, please review the enclosed instructions. Your monitor has  already been registered assigning a specific monitor serial # to you.   Billing and Patient Assistance Program information  Robert Schultz has been supplied with any insurance information on record for billing. [employer] offers a sliding scale Patient Assistance Program for patients without insurance, or whose  insurance does not completely cover the cost of the ZIO patch monitor. You must apply for the  Patient Assistance Program to qualify for the discounted rate. To apply, call [employer] at [phone],  select option 4, select option 2 , ask to apply for the Patient Assistance Program, (you can request an  interpreter if needed). [employer] will ask your household  income and how many people are in your  household. [employer] will quote your out-of-pocket cost based on this information. They will also be able  to set up a 12 month interest free payment plan if needed.  Applying the monitor   Shave hair from upper left chest.  Hold the abrader disc by orange tab. Rub the abrader in 40 strokes over left upper chest as indicated in  your monitor instructions.  Clean area with 4 enclosed alcohol pads. Use all pads to ensure the area is cleaned thoroughly. Let  dry.  Apply patch as indicated in monitor instructions. Patch will be placed under collarbone on left side of  chest with arrow pointing upward.  Rub patch adhesive wings for 2 minutes. Remove the white label marked "1". Remove the white label  marked "2". Rub patch adhesive wings for 2 additional minutes.  While looking in a mirror, press and release button in center of patch. A small green light will flash 3-4  times. This will be your only indicator that the monitor has been turned on.  Do not shower for the first 24 hours. You may shower after the first 24 hours.  Press the button if you feel a symptom. You will hear a small click. Record Date, Time and Symptom in  the Patient Log.   Starting the Gateway  In your kit there is a Hydrographic surveyor box the size of a cellphone. This is Airline pilot. It transmits all your  recorded data to [facility]. This box must  always stay within 10 feet of you. Open the box and push the *  button. There will be a light that blinks orange and then green a few times. When the light stops  blinking, the Gateway is connected to the ZIO patch. Call [employer] at [phone] to confirm your monitor is transmitting.  Returning your monitor  Remove your patch and place it inside the Gateway. In the lower half of the Gateway there is a white  bag with prepaid postage on it. Place Gateway in bag and seal. Mail package back to [place] as soon as  possible. Your physician should  have your final report approximately 7 days after you have mailed back  your monitor. Call [facility] Customer Care at [phone] if you have questions regarding your ZIO AT  patch monitor. Call them immediately if you see an orange light blinking on your monitor.  If your monitor falls off in less than 4 days, contact our Monitor department at [phone]. If your  monitor becomes loose or falls off after 4 days call [employer] at [phone] for suggestions on  securing your monitor    Follow-Up: At [facility], you and your health needs are our priority.  As part of our continuing mission to provide you with exceptional heart care, we have created designated Provider Care Teams.  These Care Teams include your primary Cardiologist (physician) and Advanced Practice Providers (APPs -  Physician Assistants and Nurse Practitioners) who all work together to provide you with the care you need, when you need it.  We recommend signing up for the patient portal called "MyChart".  Sign up information is provided on this After Visit Summary.  MyChart is used to connect with patients for Virtual Visits (Telemedicine).  Patients are able to view lab/test results, encounter notes, upcoming appointments, etc.  Non-urgent messages can be sent to your provider as well.   To learn more about what you can do with MyChart, go to [email or web address].    Your next appointment:   6-8 week(s)  Provider:   Nanetta Batty, MD

## 2023-04-01 DIAGNOSIS — I10 Essential (primary) hypertension: Secondary | ICD-10-CM

## 2023-04-01 DIAGNOSIS — R002 Palpitations: Secondary | ICD-10-CM

## 2023-04-09 ENCOUNTER — Telehealth: Payer: Self-pay | Admitting: Cardiovascular Disease

## 2023-04-09 NOTE — Telephone Encounter (Signed)
Reports that was told to wear monitor for 14 days. Pt has been wearing it for a week and has had multiple episodes while wearing it. He is wondering if he can go ahead and take it off and mail it in; due to already having so many episodes recorded (should have enough data) He just feels horrible and wants the results as soon as possible to figure out next step.   Informed him that I will send this to Joni Reining, NP and will contact him soon. He verbalized understanding.

## 2023-04-09 NOTE — Telephone Encounter (Signed)
Pt would like to talk with Joni Reining in regards to the monitor he has been wearing. Please advise.

## 2023-04-24 ENCOUNTER — Telehealth: Payer: Self-pay

## 2023-04-24 NOTE — Telephone Encounter (Addendum)
Results viewed by patient via MyChart.----- Message from Joni Reining sent at 04/23/2023  8:37 AM EDT ----- I have reviewed the monitor.  He was predominately normal rhythm. He had one burst of fast HR lasting 5 seconds only, with occasional extra beats (about 2.6% of the time).  There are no concerns here unless he is symptomatic. We can discuss further on follow up about possibility of medications to use as needed,

## 2023-04-25 ENCOUNTER — Ambulatory Visit: Payer: Self-pay | Admitting: Adult Health

## 2023-05-07 ENCOUNTER — Ambulatory Visit: Payer: BC Managed Care – PPO | Admitting: Physician Assistant

## 2023-05-07 ENCOUNTER — Ambulatory Visit: Payer: BC Managed Care – PPO | Attending: Physician Assistant | Admitting: Physician Assistant

## 2023-05-07 ENCOUNTER — Encounter: Payer: Self-pay | Admitting: Physician Assistant

## 2023-05-07 VITALS — BP 118/80 | HR 83 | Ht 68.0 in | Wt 230.0 lb

## 2023-05-07 DIAGNOSIS — I2510 Atherosclerotic heart disease of native coronary artery without angina pectoris: Secondary | ICD-10-CM | POA: Diagnosis not present

## 2023-05-07 DIAGNOSIS — I10 Essential (primary) hypertension: Secondary | ICD-10-CM

## 2023-05-07 DIAGNOSIS — E119 Type 2 diabetes mellitus without complications: Secondary | ICD-10-CM

## 2023-05-07 DIAGNOSIS — Z7984 Long term (current) use of oral hypoglycemic drugs: Secondary | ICD-10-CM

## 2023-05-07 DIAGNOSIS — E785 Hyperlipidemia, unspecified: Secondary | ICD-10-CM | POA: Diagnosis not present

## 2023-05-07 DIAGNOSIS — R002 Palpitations: Secondary | ICD-10-CM | POA: Diagnosis not present

## 2023-05-07 MED ORDER — METOPROLOL SUCCINATE ER 25 MG PO TB24: 25.0000 mg | ORAL_TABLET | Freq: Two times a day (BID) | ORAL | 3 refills | Status: DC

## 2023-05-07 NOTE — Progress Notes (Signed)
Cardiology Office Note:  .   Date:  05/07/2023  ID:  Robert Schultz, DOB 1965-11-27, MRN [id number] PCP: Farris Has, MD  [facility] Providers Cardiologist:  Robert Batty, MD     History of Present Illness: .   Robert Schultz is a 57 y.o. male with past medical history of CAD, HTN, HLD, DM II, PVCs, OSA and obesity.  He was previously evaluated by Dr. Deborah Schultz.  Previous Holter monitor showed PVCs.  Myoview in December 2020 was nonischemic.  Coronary CTA in February 2023 showed coronary calcium score of 1059, three-vessel coronary artery disease, difficult study due to blooming artifact from extensive coronary calcification, unable to do FFR due to artifact.  Outpatient cardiac catheterization performed on 10/29/2021 showed nonobstructive CAD with 40% ostial to mid left main, 40% mid LAD, and 45% mid left circumflex lesion. He was previously seen by Robert Person NP in March 2024 at which time he was doing well.  He was recently seen by Robert Reining NP on 03/25/2023 for worsening palpitation.  A 2-week ZIO monitor was arranged.  Patient presents today for follow-up.  I have reviewed the recent heart monitor report in detail with the patient.  His PVC burden is quite low, however he continued to have palpitation.  His palpitation is in the epigastric up to his throat area, he says it feels like a skipped heartbeat followed by very intense explosive beating of the heart.  Although esophageal spasm can mimic his symptom, however I still suspect he is describing symptomatic PVCs.  I will increase his metoprolol succinate to 25 mg twice daily dosing.  He can keep his follow-up with Dr. Allyson Schultz.  I discussed the possibility of antiarrhythmic therapy versus PVC ablation, however we decided to hold off on this to see if the higher dose of rate control medication will work.  ROS:   Patient complains of palpitation but no chest pain or shortness of breath.  No lower extremity edema, orthopnea or  PND.  Studies Reviewed: .        Cardiac Studies & Procedures   CARDIAC CATHETERIZATION  CARDIAC CATHETERIZATION 10/29/2021  Narrative Images from the original result were not included.    Ost LM to Mid LM lesion is 40% stenosed.   Mid LAD lesion is 40% stenosed.   Mid Cx lesion is 45% stenosed.   The left ventricular systolic function is normal.   LV end diastolic pressure is normal.   The left ventricular ejection fraction is 50-55% by visual estimate.  Robert Schultz is a 57 y.o. male   [id number] LOCATION:  FACILITY: [facility] PHYSICIAN: Robert Schultz, M.D. 04-05-1966   DATE OF PROCEDURE:  10/29/2021  DATE OF DISCHARGE:     CARDIAC CATHETERIZATION    History obtained from chart review.Robert Schultz is a 57 y.o.  moderately overweight married Caucasian male father of 3 children referred by Dr. Kateri Schultz for cardiovascular valuation because of occasional dizziness and atypical chest pain.  He formally saw Dr. Deborah Schultz as a cardiologist years ago.  I last saw him in the office 09/05/2020 . He did apparently have a Holter monitor that showed PVCs and an abnormal cardiac cath.  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  His risk factors include type 2 diabetes on Metformin, mild hyperlipidemia intolerant to statin therapy and family history of heart disease with a father who had stents  in his 77s.  He has been taking care of his parents his caregivers for the last 20 half years and unfortunately his father passed away 2024-11-07of this year at age 73 of cancer.  He is dealing with this currently.  He was put on Jardiance by his PCP and noticed the dizziness at that time which is since subsided after Jardiance was discontinued.  He has had infrequent episodes of atypical chest pain.  He does admit to dietary indiscretion.  The only exercise he gets is occasionally walking his dog.  He had a  coronary calcium score 09/22/2019 which was 533.  Subsequent Myoview stress test performed 09/10/2019 was nonischemic.  I did refer him to Dr. Rennis Schultz for lipid management and his most recent lipid profile performed 03/18/2019 did reveal total cholesterol 122, LDL 49 and HDL of 36 with a triglyceride level of 236.  He was recently seen in the urgent care center for hemoptysis thought to be related to an upper respiratory tract infection.  He was treated with prednisone and an oral antibiotic.  He was complaining some atypical chest pain/chest wall pain and costochondritic symptoms.  Since I saw him a year ago he continues to have chest pain which is somewhat atypical and attributed to a GI etiology.  He had a repeat coronary calcium score of 1059 on 10/24/2021 and subsequent coronary CTA and FFR analysis that suggested three-vessel disease.  Impression [name] has mild distal left main disease, and mild mid LAD and circumflex disease.  He has an anterior takeoff dominant right duct free of significant disease.  I think that his coronary CTA "overcalled" the degree of coronary artery disease.  There are no "culprit lesions" requiring intervention.  Medical therapy will be recommended.  The sheath was removed and a TR band was placed on the right wrist to achieve patent hemostasis.  The patient left lab in stable condition.  I will see him back in the office in several weeks for follow-up.  Robert Schultz. MD, [facility] 10/29/2021 11:17 AM  Findings Coronary Findings Diagnostic  Dominance: Right  Left Main Ost LM to Mid LM lesion is 40% stenosed.  Left Anterior Descending Mid LAD lesion is 40% stenosed.  Left Circumflex Mid Cx lesion is 45% stenosed.  Intervention  No interventions have been documented.   STRESS TESTS  MYOCARDIAL PERFUSION IMAGING 09/10/2019  Narrative  The left ventricular ejection fraction is normal (55-65%).  Nuclear stress EF: 59%.  There was no ST segment deviation  noted during stress.  No T wave inversion was noted during stress.  The study is normal.  This is a low risk study.     MONITORS  LONG TERM MONITOR (3-14 DAYS) 04/16/2023  Narrative Patch Wear Time:  9 days and 18 hours (2024-07-09T15:55:08-399 to 2024-07-19T10:53:06-399)  Patient had a min HR of 52 bpm, max HR of 141 bpm, and avg HR of 78 bpm. Predominant underlying rhythm was Sinus Rhythm. 1 run of Supraventricular Tachycardia occurred lasting 5 beats with a max rate of 136 bpm (avg 132 bpm). Isolated SVEs were rare (<1.0%), SVE Couplets were rare (<1.0%), and SVE Triplets were rare (<1.0%). Isolated VEs were occasional (2.6%, 28021), VE Couplets were rare (<1.0%, 39), and no VE Triplets were present. Ventricular Trigeminy was present.  SR/SB/ST Occasional PVCs(2..6% burden) Short run of SVT   CT SCANS  CT CORONARY MORPH W/CTA COR W/SCORE 10/24/2021  Addendum 10/24/2021  2:56 PM ADDENDUM REPORT: 10/24/2021 14:54  CLINICAL DATA:  Chest  pain  EXAM: Cardiac CTA  MEDICATIONS: Sub lingual nitro. 4mg  x 2  TECHNIQUE: The patient was scanned on a Siemens 192 slice scanner. Gantry rotation speed was 250 msecs. Collimation was 0.6 mm. A 100 kV prospective scan was triggered in the ascending thoracic aorta at 35-75% of the R-R interval. Average HR during the scan was 60 bpm. The 3D data set was interpreted on a dedicated work station using MPR, MIP and VRT modes. A total of 80cc of contrast was used.  FINDINGS: Non-cardiac: See separate report from [facility].  Pulmonary veins drain normally to the left atrium. No LA appendage thrombus noted.  Calcium Score: 1059 Agatston units.  Coronary Arteries: Right dominant with no anomalies  LM: Mixed plaque distal left main, mild (25-49%) stenosis.  LAD system: Extensive calcified plaque proximal LAD. Cannot rule out severe (70-99%) stenosis.  Circumflex system: Images affected by misregistration artifact. There is  extensive mixed plaque in the AV LCx. Suspect at least moderate (50-69%) stenosis in the mid LCx.  RCA system: Extensive mixed plaque in the RCA, possible moderate (50-69%) stenosis in the distal RCA.  IMPRESSION: 1. Coronary artery calcium score 1059 Agatston units. This places the patient in the 99th percentile for age and gender, suggesting high risk for future cardiac events.  2. Extensive calcified plaque. Possible moderate disease in the mid LCx and distal RCA, cannot rule out severe stenosis in the proximal LAD.  Difficult study due to blooming artifact from extensive coronary calcification. Unable to send for FFR due to misregistration artifact. Would consider cath if patient has concerning symptoms, otherwise would consider functional study (Cardiolite).  Dalton Mclean   Electronically Signed By: Marca Ancona M.D. On: 10/24/2021 14:54  Narrative EXAM: OVER-READ INTERPRETATION  CT CHEST  The following report is an over-read performed by radiologist Dr. Irish Lack of [facility] on 10/24/2021. This over-read does not include interpretation of cardiac or coronary anatomy or pathology. The coronary CTA interpretation by the cardiologist is attached.  COMPARISON:  CT a of the chest on 08/27/2021. CT of the abdomen on 09/26/2018  FINDINGS: Vascular: No significant noncardiac vascular findings.  Mediastinum/Nodes: Visualized mediastinum and hilar regions demonstrate no lymphadenopathy or masses.  Lungs/Pleura: Visualized lungs show no evidence of pulmonary edema, consolidation, pneumothorax, nodule or pleural fluid.  Upper Abdomen: Stable steatosis of the liver.  Musculoskeletal: No chest wall mass or suspicious bone lesions identified.  IMPRESSION: Stable hepatic steatosis.  Electronically Signed: By: Irish Lack M.D. On: 10/24/2021 09:32   CT SCANS  CT CARDIAC SCORING (SELF PAY ONLY) 09/22/2019  Addendum 09/22/2019 11:10  AM ADDENDUM REPORT: 09/22/2019 11:08  CLINICAL DATA:  Risk stratification  EXAM: Coronary Calcium Score  TECHNIQUE: The patient was scanned on a [employer]. Axial non-contrast 3 mm slices were carried out through the heart. The data set was analyzed on a dedicated work station and scored using the Agatson method.  FINDINGS: Non-cardiac: See separate report from [facility].  Ascending Aorta: Atherosclerotic calcification.  Normal caliber.  Pericardium: Normal  Coronary arteries: Normal origins.  Multivessel coronary calcium.  IMPRESSION: Coronary calcium score of 533. This was 98th percentile for age and sex matched control   Electronically Signed By: Chrystie Nose M.D. On: 09/22/2019 11:08  Narrative EXAM: OVER-READ INTERPRETATION  CT CHEST  The following report is an over-read performed by radiologist Dr. Charlett Nose of [facility] on 09/22/2019. This over-read does not include interpretation of cardiac or coronary anatomy or pathology. The  coronary calcium score interpretation by the cardiologist is attached.  COMPARISON:  04/20/2018  FINDINGS: Vascular: Heart is normal size. Visualized aorta normal caliber with punctate aortic arch calcifications.  Mediastinum/Nodes: No adenopathy in the lower mediastinum or hila.  Lungs/Pleura: Flat nodule seen in the right lower lobe measures 4-5 mm, stable since prior study. No confluent opacities or effusions.  Upper Abdomen: Diffuse fatty infiltration of the liver.  Musculoskeletal: Chest wall soft tissues are unremarkable. No acute bony abnormality.  IMPRESSION: Flat 4 mm nodule in the anterior right lower lobe, stable since prior study. Appearance and stability suggest a benign nodule. This could be followed with repeat CT in 1 year to ensure 2 year stability from original study.  Fatty infiltration of the liver.  Electronically Signed: By: Charlett Nose M.D. On:  09/22/2019 10:27          Risk Assessment/Calculations:             Physical Exam:   VS:  BP 118/80   Pulse 83   Ht 5\' 8"  (1.727 m)   Wt 230 lb (104.3 kg)   SpO2 97%   BMI 34.97 kg/m    Wt Readings from Last 3 Encounters:  05/07/23 230 lb (104.3 kg)  03/25/23 233 lb 3.2 oz (105.8 kg)  11/26/22 234 lb (106.1 kg)    GEN: Well nourished, well developed in no acute distress NECK: No JVD; No carotid bruits CARDIAC: RRR, no murmurs, rubs, gallops RESPIRATORY:  Clear to auscultation without rales, wheezing or rhonchi  ABDOMEN: Soft, non-tender, non-distended EXTREMITIES:  No edema; No deformity   ASSESSMENT AND PLAN: .    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  I increased his metoprolol succinate to 25 mg twice a day for better coverage.  He has a follow-up with Dr. Allyson Schultz later this both, if symptom is still persist, may discuss possibility of antiarrhythmic therapy by EP service.  CAD: Denies any chest pain.  Continue aspirin and Crestor.  Hypertension: Blood pressure well-controlled  Hyperlipidemia: On Crestor.  DM2: Managed by primary care provider.       Dispo: Follow-up with Dr. Allyson Schultz as previously scheduled.  Signed, Azalee Course, PA

## 2023-05-07 NOTE — Patient Instructions (Signed)
Medication Instructions:  START METOPROLOL SUCCINATE 25MG  TWICE A DAY. *If you need a refill on your cardiac medications before your next appointment, please call your pharmacy*   Lab Work: NO LABS If you have labs (blood work) drawn today and your tests are completely normal, you will receive your results only by: MyChart Message (if you have MyChart) OR A paper copy in the mail If you have any lab test that is abnormal or we need to change your treatment, we will call you to review the results.   Testing/Procedures: NO TESTING   Follow-Up: At [facility], you and your health needs are our priority.  As part of our continuing mission to provide you with exceptional heart care, we have created designated Provider Care Teams.  These Care Teams include your primary Cardiologist (physician) and Advanced Practice Providers (APPs -  Physician Assistants and Nurse Practitioners) who all work together to provide you with the care you need, when you need it.  Your next appointment:   KEEP UPCOMING FOLLOW UP APPOINTMENT WITH DR Allyson Sabal 05/21/2023.

## 2023-05-21 ENCOUNTER — Ambulatory Visit: Payer: BC Managed Care – PPO | Attending: Cardiovascular Disease | Admitting: Cardiovascular Disease

## 2023-05-21 ENCOUNTER — Encounter: Payer: Self-pay | Admitting: Cardiovascular Disease

## 2023-05-21 VITALS — BP 120/74 | HR 76 | Ht 68.0 in | Wt 229.8 lb

## 2023-05-21 DIAGNOSIS — R002 Palpitations: Secondary | ICD-10-CM

## 2023-05-21 DIAGNOSIS — E782 Mixed hyperlipidemia: Secondary | ICD-10-CM

## 2023-05-21 DIAGNOSIS — Z8249 Family history of ischemic heart disease and other diseases of the circulatory system: Secondary | ICD-10-CM

## 2023-05-21 DIAGNOSIS — G4733 Obstructive sleep apnea (adult) (pediatric): Secondary | ICD-10-CM | POA: Diagnosis not present

## 2023-05-21 DIAGNOSIS — I10 Essential (primary) hypertension: Secondary | ICD-10-CM | POA: Diagnosis not present

## 2023-05-21 DIAGNOSIS — I2510 Atherosclerotic heart disease of native coronary artery without angina pectoris: Secondary | ICD-10-CM

## 2023-05-21 NOTE — Assessment & Plan Note (Signed)
History of coronary calcium score performed 10/24/2021 which is 1059 which led to a subsequent coronary CTA that suggested three-vessel disease.  Based this I performed diagnostic cardiac catheterization on him 11/26/2021 that revealed mild noncritical CAD.  He denies chest pain currently.  He attributed his prior chest pain to stress.

## 2023-05-21 NOTE — Progress Notes (Signed)
05/21/2023 Robert Schultz   1965/11/23  [id number]  Primary Physician Farris Has, MD Primary Cardiologist: Runell Gess MD Nicholes Calam[place], [place]  HPI:  Robert Schultz is a 57 y.o.  moderately overweight married Caucasian male father of 3 children referred by Dr. Kateri Plummer for cardiovascular evaluation because of occasional dizziness and atypical chest pain.  He formally saw Dr. Deborah Chalk as a cardiologist years ago.  I last saw him in the office 11/20/2021.  He did apparently have a Holter monitor that showed PVCs and an abnormal cardiac cath.  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  His risk factors include type 2 diabetes on Metformin, mild hyperlipidemia intolerant to statin therapy and family history of heart disease with a father who had stents in his 59s.  He has been taking care of his parents his caregivers for the last 20 half years and unfortunately his father passed away Oct 28, 2024of this year at age 14 of cancer.  He is dealing with this currently.  He was put on Jardiance by his PCP and noticed the dizziness at that time which is since subsided after Jardiance was discontinued.  He has had infrequent episodes of atypical chest pain.  He does admit to dietary indiscretion.  The only exercise he gets is occasionally walking his dog.   He had a coronary calcium score 09/22/2019 which was 533.  Subsequent Myoview stress test performed 09/10/2019 was nonischemic.  I did refer him to Dr. Rennis Golden for lipid management and his most recent lipid profile performed 03/18/2019 did reveal total cholesterol 122, LDL 49 and HDL of 36 with a triglyceride level of 236.  He was recently seen in the urgent care center for hemoptysis thought to be related to an upper respiratory tract infection.  He was treated with prednisone and an oral antibiotic.  He was complaining some atypical chest pain/chest wall  pain and costochondritic symptoms.      He had a repeat coronary calcium score of 1059 on 10/24/2021 and subsequent coronary CTA and FFR analysis that suggested three-vessel disease.  Based on this I performed outpatient radial diagnostic cath on him 11/26/2021 revealing noncritical CAD.  Since that time he suspects that his chest pain was related to stress and he is altered his lifestyle with significant improvement in his symptoms.  Since I saw him 6 months ago he did see how making PA-C 2 weeks ago because of palpitations.  He had an event monitor performed 03/25/2023 that showed PVCs with a 2.6% burden.  His metoprolol dose was doubled and his PVCs have significantly improved.  He attributes this to stress as well.  Apparently his brother who is several years younger than him and lives in [place] and recently had a myocardial infarction.   Current Meds  Medication Sig   acetaminophen (TYLENOL) 500 MG tablet Take 1,000 mg by mouth every 6 (six) hours as needed.   aspirin EC 81 MG tablet Take 81 mg by mouth daily. Swallow whole.   Cyanocobalamin (B-12 COMPLIANCE INJECTION IJ) Inject 1 Dose as directed every 30 (thirty) days.   glipiZIDE (GLUCOTROL XL) 5 MG 24 hr tablet Take 5 mg by mouth daily.   metFORMIN (GLUCOPHAGE-XR) 500 MG 24 hr tablet Take 1,000 mg by mouth 2 (two) times daily.   metoprolol succinate (TOPROL XL) 25 MG 24 hr tablet Take 1 tablet (  25 mg total) by mouth in the morning and at bedtime.   Multiple Vitamin (MULTIVITAMIN WITH MINERALS) TABS tablet Take 1 tablet by mouth daily.   pantoprazole (PROTONIX) 40 MG tablet 1 TABLET ONCE A DAY TAKE 30 MINUTES PRIOR TO BREAKFAST.   Peppermint Oil (IBGARD) 90 MG CPCR Take 2 capsules by mouth 2 (two) times daily.   rosuvastatin (CRESTOR) 20 MG tablet Take 1 tablet (20 mg total) by mouth daily. Keep upcoming appointment   sildenafil (REVATIO) 20 MG tablet Take 40 mg by mouth as needed (ED).   sucralfate (CARAFATE) 1 g tablet Take 1 g by mouth 3  (three) times daily.   Current Facility-Administered Medications for the 05/21/23 encounter (Office Visit) with Runell Gess, MD  Medication   sodium chloride flush (NS) 0.9 % injection 3 mL     Allergies  Allergen Reactions   Hydrocodone Other (See Comments)    "I break into a violent sweat--fast."   Penicillins     Profuse sweating Swelling of the face/tongue/throat, SOB, or low BP? N Sudden or severe rash/hives, skin peeling, or the inside of the mouth or nose? n Did it require medical treatment? N When did it last happen?      unk If all above answers are "NO", may proceed with cephalosporin use.    Atorvastatin     Restlessness    Dihydrocodeine     Sweating    Acetaminophen-Codeine Rash   Codeine Rash    Social History   Socioeconomic History   Marital status: Married    Spouse name: Not on file   Number of children: Not on file   Years of education: Not on file   Highest education level: Not on file  Occupational History   Not on file  Tobacco Use   Smoking status: Never   Smokeless tobacco: Never  Vaping Use   Vaping status: Never Used  Substance and Sexual Activity   Alcohol use: No   Drug use: No   Sexual activity: Yes  Other Topics Concern   Not on file  Social History Narrative   Not on file   Social Determinants of Health   Financial Resource Strain: Not on file  Food Insecurity: No Food Insecurity (09/07/2021)   Received from [facility] visits prior to 11/23/2022., [facility] visits prior to 11/23/2022.   Hunger Vital Sign    Worried About Programme researcher, broadcasting/film/video in the Last Year: Never true    Ran Out of Food in the Last Year: Never true  Transportation Needs: Not on file  Physical Activity: Not on file  Stress: Not on file  Social Connections: Not on file  Intimate Partner Violence: Not on file     Review of Systems: General: negative for chills, fever, night sweats or weight changes.   Cardiovascular: negative for chest pain, dyspnea on exertion, edema, orthopnea, palpitations, paroxysmal nocturnal dyspnea or shortness of breath Dermatological: negative for rash Respiratory: negative for cough or wheezing Urologic: negative for hematuria Abdominal: negative for nausea, vomiting, diarrhea, bright red blood per rectum, melena, or hematemesis Neurologic: negative for visual changes, syncope, or dizziness All other systems reviewed and are otherwise negative except as noted above.    Blood pressure 120/74, pulse 76, height 5\' 8"  (1.727 m), weight 229 lb 12.8 oz (104.2 kg), SpO2 98%.  General appearance: alert and no distress Neck: no adenopathy, no carotid bruit, no JVD, supple, symmetrical, trachea midline, and thyroid  not enlarged, symmetric, no tenderness/mass/nodules Lungs: clear to auscultation bilaterally Heart: Regular rate and rhythm without murmurs gallops rubs or clicks Extremities: extremities normal, atraumatic, no cyanosis or edema Pulses: 2+ and symmetric Skin: Skin color, texture, turgor normal. No rashes or lesions Neurologic: Grossly normal  EKG not performed today      ASSESSMENT AND PLAN:   OBSTRUCTIVE SLEEP APNEA History of obstructive sleep apnea on CPAP which he benefits from.  HTN (hypertension) History of essential hypertension blood pressure measured today at 120/74.  He is on metoprolol.  Heart palpitations History of heart palpitations demonstrated to be PVCs on event monitoring.  He attributes this to stress.  He recently saw Azalee Course, PA [place] in the office who doubled his metoprolol which resulted in marked improvement in his palpitations.  Hyperlipidemia History of hyperlipidemia on rosuvastatin with lipid profile performed 04/15/2023 revealing total cholesterol 116, LDL 48 and HDL of 35.  Family history of heart disease History of family history of heart disease with a father who had stents in the 45s.  Apparently his brother, who  is 51 years old and lives in [place], recently had an MRI and cath that revealed a tight proximal LAD lesion.  Coronary artery disease History of coronary calcium score performed 10/24/2021 which is 1059 which led to a subsequent coronary CTA that suggested three-vessel disease.  Based this I performed diagnostic cardiac catheterization on him 11/26/2021 that revealed mild noncritical CAD.  He denies chest pain currently.  He attributed his prior chest pain to stress.     Runell Gess MD FACP,FACC,FAHA, [facility] 05/21/2023 2:08 PM

## 2023-05-21 NOTE — Assessment & Plan Note (Signed)
History of hyperlipidemia on rosuvastatin with lipid profile performed 04/15/2023 revealing total cholesterol 116, LDL 48 and HDL of 35.

## 2023-05-21 NOTE — Assessment & Plan Note (Signed)
History of obstructive sleep apnea on CPAP which he benefits from 

## 2023-05-21 NOTE — Assessment & Plan Note (Signed)
History of essential hypertension blood pressure measured today at 120/74.  He is on metoprolol.

## 2023-05-21 NOTE — Patient Instructions (Signed)
Medication Instructions:  Your physician recommends that you continue on your current medications as directed. Please refer to the Current Medication list given to you today.  *If you need a refill on your cardiac medications before your next appointment, please call your pharmacy*   Follow-Up: At [facility], you and your health needs are our priority.  As part of our continuing mission to provide you with exceptional heart care, we have created designated Provider Care Teams.  These Care Teams include your primary Cardiologist (physician) and Advanced Practice Providers (APPs -  Physician Assistants and Nurse Practitioners) who all work together to provide you with the care you need, when you need it.  We recommend signing up for the patient portal called "MyChart".  Sign up information is provided on this After Visit Summary.  MyChart is used to connect with patients for Virtual Visits (Telemedicine).  Patients are able to view lab/test results, encounter notes, upcoming appointments, etc.  Non-urgent messages can be sent to your provider as well.   To learn more about what you can do with MyChart, go to [email or web address].    Your next appointment:   6 month(s)  Provider:   Joni Reining, DNP, ANP       Then, Nanetta Batty, MD will plan to see you again in 12 month(s).

## 2023-05-21 NOTE — Assessment & Plan Note (Signed)
History of heart palpitations demonstrated to be PVCs on event monitoring.  He attributes this to stress.  He recently saw Azalee Course, PA [place] in the office who doubled his metoprolol which resulted in marked improvement in his palpitations.

## 2023-05-21 NOTE — Assessment & Plan Note (Signed)
History of family history of heart disease with a father who had stents in the 26s.  Apparently his brother, who is 55 years old and lives in [place], recently had an MRI and cath that revealed a tight proximal LAD lesion.

## 2023-09-04 ENCOUNTER — Other Ambulatory Visit (HOSPITAL_COMMUNITY): Payer: Self-pay | Admitting: Family Medicine

## 2023-09-04 DIAGNOSIS — R1030 Lower abdominal pain, unspecified: Secondary | ICD-10-CM

## 2023-09-08 ENCOUNTER — Ambulatory Visit (HOSPITAL_COMMUNITY)
Admission: RE | Admit: 2023-09-08 | Discharge: 2023-09-08 | Disposition: A | Discharge status: Home / Self Care | Payer: BC Managed Care – PPO | Source: Ambulatory Visit | Attending: Family Medicine | Admitting: Family Medicine

## 2023-09-08 ENCOUNTER — Encounter (HOSPITAL_COMMUNITY): Payer: Self-pay

## 2023-09-08 DIAGNOSIS — R1030 Lower abdominal pain, unspecified: Secondary | ICD-10-CM | POA: Insufficient documentation

## 2023-09-08 HISTORY — DX: Type 2 diabetes mellitus without complications: E11.9

## 2023-09-08 MED ORDER — IOHEXOL 300 MG/ML  SOLN
100.0000 mL | Freq: Once | INTRAMUSCULAR | Status: AC | PRN
  Administered 2023-09-08: 100 mL via INTRAVENOUS

## 2024-05-30 ENCOUNTER — Other Ambulatory Visit: Payer: Self-pay | Admitting: Physician Assistant

## 2024-07-01 ENCOUNTER — Other Ambulatory Visit: Payer: Self-pay | Admitting: Cardiovascular Disease

## 2024-07-06 ENCOUNTER — Telehealth: Payer: Self-pay | Admitting: Cardiovascular Disease

## 2024-07-06 ENCOUNTER — Encounter: Payer: Self-pay | Admitting: Cardiovascular Disease

## 2024-07-06 MED ORDER — METOPROLOL SUCCINATE ER 25 MG PO TB24: 25.0000 mg | ORAL_TABLET | Freq: Every day | ORAL | 0 refills | Status: DC

## 2024-07-06 NOTE — Telephone Encounter (Signed)
*  STAT* If patient is at the pharmacy, call can be transferred to refill team.   1. Which medications need to be refilled? (please list name of each medication and dose if known) metoprolol  succinate (TOPROL -XL) 25 MG 24 hr tablet    2. Would you like to learn more about the convenience, safety, & potential cost savings by using the [facility]? No    3. Are you open to using the [facility] (Type [facility]. No    4. Which pharmacy/location (including street and city if local pharmacy) is medication to be sent to? [id number] [place], [place] - [address]    5. Do they need a 30 day or 90 day supply? 90 day   Pt is out of medication Pt has appt scheduled 11/10

## 2024-07-06 NOTE — Telephone Encounter (Signed)
 Pt's medication was sent to pt's pharmacy as requested. Confirmation received.

## 2024-08-02 ENCOUNTER — Ambulatory Visit: Payer: Self-pay | Attending: Cardiovascular Disease | Admitting: Cardiovascular Disease

## 2024-08-02 ENCOUNTER — Encounter: Payer: Self-pay | Admitting: Cardiovascular Disease

## 2024-08-02 VITALS — BP 140/90 | HR 76 | Ht 68.0 in | Wt 243.0 lb

## 2024-08-02 DIAGNOSIS — I10 Essential (primary) hypertension: Secondary | ICD-10-CM

## 2024-08-02 DIAGNOSIS — E782 Mixed hyperlipidemia: Secondary | ICD-10-CM

## 2024-08-02 DIAGNOSIS — R002 Palpitations: Secondary | ICD-10-CM

## 2024-08-02 DIAGNOSIS — G4733 Obstructive sleep apnea (adult) (pediatric): Secondary | ICD-10-CM

## 2024-08-02 DIAGNOSIS — I2510 Atherosclerotic heart disease of native coronary artery without angina pectoris: Secondary | ICD-10-CM

## 2024-08-02 NOTE — Assessment & Plan Note (Signed)
History of obstructive sleep apnea on CPAP which he benefits from 

## 2024-08-02 NOTE — Assessment & Plan Note (Signed)
 History of atypical chest pain and elevated coronary calcium  score of 1059 performed 10/24/2021 with CTA that showed three-vessel disease.  Based on this I performed outpatient radial diagnostic cath on him 11/26/2021 revealing noncritical CAD.  He also has GERD.  He is complained of fairly constant chest pain for the last 2 weeks that does not sound anginal.  We talked about the importance of lifestyle modification.  He is at goal for secondary prevention.

## 2024-08-02 NOTE — Assessment & Plan Note (Signed)
 History of hyperlipidemia on statin therapy with lipid profile performed 01/13/2024 revealing total cholesterol 125, LDL 61 HDL 38.

## 2024-08-02 NOTE — Progress Notes (Signed)
 08/02/2024 Robert Schultz   09-18-1966  [id number]  Primary Physician Robert Righter, MD Primary Cardiologist: Robert JINNY Lesches MD Robert Schultz, [place]  HPI:  Robert Schultz is a 58 y.o.   moderately overweight married Caucasian male father of 3 children referred by Dr. Kip for cardiovascular evaluation because of occasional dizziness and atypical chest pain.  He formally saw Dr. Tisa as a cardiologist years ago.  I last saw him in the office 05/21/2023.  He did apparently have a Holter monitor that showed PVCs and an abnormal cardiac cath.  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  His risk factors include type 2 diabetes on Metformin, mild hyperlipidemia intolerant to statin therapy and family history of heart disease with a father who had stents in his 25s.  He has been taking care of his parents his caregivers for the last 20 half years and unfortunately his father passed away October 25, 2025of this year at age 64 of cancer.  He is dealing with this currently.  He was put on Jardiance by his PCP and noticed the dizziness at that time which is since subsided after Jardiance was discontinued.  He has had infrequent episodes of atypical chest pain.  He does admit to dietary indiscretion.  The only exercise he gets is occasionally walking his dog.   He had a coronary calcium  score 09/22/2019 which was 533.  Subsequent Myoview  stress test performed 09/10/2019 was nonischemic.  I did refer him to Dr. Mona for lipid management and his most recent lipid profile performed 03/18/2019 did reveal total cholesterol 122, LDL 49 and HDL of 36 with a triglyceride level of 236.  He was recently seen in the urgent care center for hemoptysis thought to be related to an upper respiratory tract infection.  He was treated with prednisone  and an oral antibiotic.  He was complaining some atypical chest pain/chest wall  pain and costochondritic symptoms.      He had a repeat coronary calcium  score of 1059 on 10/24/2021 and subsequent coronary CTA and FFR analysis that suggested three-vessel disease.  Based on this I performed outpatient radial diagnostic cath on him 11/26/2021 revealing noncritical CAD.  Since that time he suspects that his chest pain was related to stress and he is altered his lifestyle with significant improvement in his symptoms.   Since I saw him in the office 1 year ago he continues to do well.  He still has significant amount of stress in his life which he is trying to manage.  He wears his CPAP religiously.  He has 2 children in college and one 74 year old in high school.  He has atypical chest pain which I suspect is related to his GERD.  His lipid profile is excellent.   Current Meds  Medication Sig   acetaminophen  (TYLENOL ) 500 MG tablet Take 1,000 mg by mouth every 6 (six) hours as needed.   aspirin  EC 81 MG tablet Take 81 mg by mouth daily. Swallow whole.   Cyanocobalamin  (B-12 COMPLIANCE INJECTION IJ) Inject 1 Dose as directed every 30 (thirty) days.   glipiZIDE (GLUCOTROL XL) 5 MG 24 hr tablet Take 5 mg by mouth daily.   metFORMIN (GLUCOPHAGE-XR) 500 MG 24 hr tablet Take 1,000 mg by mouth 2 (two) times daily.   metoprolol  succinate (TOPROL -XL) 25 MG 24 hr tablet Take 1 tablet (25 mg total)  by mouth daily.   minocycline (DYNACIN) 100 MG tablet Take 100 mg by mouth as needed.   Multiple Vitamin (MULTIVITAMIN WITH MINERALS) TABS tablet Take 1 tablet by mouth daily.   pantoprazole  (PROTONIX ) 40 MG tablet 1 TABLET ONCE A DAY TAKE 30 MINUTES PRIOR TO BREAKFAST.   Peppermint Oil (IBGARD) 90 MG CPCR Take 2 capsules by mouth 2 (two) times daily.   rosuvastatin  (CRESTOR ) 20 MG tablet Take 1 tablet (20 mg total) by mouth daily. Keep upcoming appointment   sildenafil (REVATIO) 20 MG tablet Take 40 mg by mouth as needed (ED).   sucralfate  (CARAFATE ) 1 g tablet Take 1 g by mouth 3 (three) times  daily.   Current Facility-Administered Medications for the 08/02/24 encounter (Office Visit) with Robert Robert PARAS, MD  Medication   sodium chloride  flush (NS) 0.9 % injection 3 mL     Allergies  Allergen Reactions   Hydrocodone Other (See Comments)    I break into a violent sweat--fast.   Penicillins     Profuse sweating Swelling of the face/tongue/throat, SOB, or low BP? N Sudden or severe rash/hives, skin peeling, or the inside of the mouth or nose? n Did it require medical treatment? N When did it last happen?      unk If all above answers are "NO", may proceed with cephalosporin use.    Atorvastatin      Restlessness    Dihydrocodeine     Sweating    Acetaminophen -Codeine Rash   Codeine Rash    Social History   Socioeconomic History   Marital status: Married    Spouse name: Not on file   Number of children: Not on file   Years of education: Not on file   Highest education level: Not on file  Occupational History   Not on file  Tobacco Use   Smoking status: Never   Smokeless tobacco: Never  Vaping Use   Vaping status: Never Used  Substance and Sexual Activity   Alcohol use: No   Drug use: No   Sexual activity: Yes  Other Topics Concern   Not on file  Social History Narrative   Not on file   Social Drivers of Health   Financial Resource Strain: Not on file  Food Insecurity: No Food Insecurity (09/07/2021)   [facility] from [facility] prior to 11/23/2022.   Hunger Vital Sign    Within the past 12 months, you worried that your food would run out before you got the money to buy more.: Never true    Within the past 12 months, the food you bought just didn't last and you didn't have money to get more.: Never true  Transportation Needs: Not on file  Physical Activity: Not on file  Stress: Not on file  Social Connections: Not on file  Intimate Partner Violence: Not on file     Review of Systems: General: negative for chills,  fever, night sweats or weight changes.  Cardiovascular: negative for chest pain, dyspnea on exertion, edema, orthopnea, palpitations, paroxysmal nocturnal dyspnea or shortness of breath Dermatological: negative for rash Respiratory: negative for cough or wheezing Urologic: negative for hematuria Abdominal: negative for nausea, vomiting, diarrhea, bright red blood per rectum, melena, or hematemesis Neurologic: negative for visual changes, syncope, or dizziness All other systems reviewed and are otherwise negative except as noted above.    Blood pressure (!) 140/90, pulse 76, height 5' 8 (1.727 m), weight 243 lb (110.2 kg).  General appearance: alert  and no distress Neck: no adenopathy, no carotid bruit, no JVD, supple, symmetrical, trachea midline, and thyroid  not enlarged, symmetric, no tenderness/mass/nodules Lungs: clear to auscultation bilaterally Heart: regular rate and rhythm, S1, S2 normal, no murmur, click, rub or gallop Extremities: extremities normal, atraumatic, no cyanosis or edema Pulses: 2+ and symmetric Skin: Skin color, texture, turgor normal. No rashes or lesions Neurologic: Grossly normal  EKG EKG Interpretation Date/Time:  Monday August 02 2024 08:30:09 EST Ventricular Rate:  76 PR Interval:  170 QRS Duration:  84 QT Interval:  390 QTC Calculation: 438 R Axis:   -30  Text Interpretation: Normal sinus rhythm Left axis deviation When compared with ECG of 25-Mar-2023 13:52, No significant change was found Confirmed by Robert Carrier [phone]) on 08/02/2024 8:34:07 AM    ASSESSMENT AND PLAN:   OBSTRUCTIVE SLEEP APNEA History of obstructive sleep apnea on CPAP which he benefits from.  HTN (hypertension) History of essential hypertension with blood pressure measured today at 140/90.  He does have an element of whitecoat hypertension.  He checked his blood pressure at home and it was 132/83.  He is on metoprolol .  Heart palpitations Palpitations found to be  PVCs on event monitoring with a 2.6% burden.  This has markedly improved with beta-blocker titration.  He attributes this to stress.  Hyperlipidemia History of hyperlipidemia on statin therapy with lipid profile performed 01/13/2024 revealing total cholesterol 125, LDL 61 HDL 38.  Coronary artery disease History of atypical chest pain and elevated coronary calcium  score of 1059 performed 10/24/2021 with CTA that showed three-vessel disease.  Based on this I performed outpatient radial diagnostic cath on him 11/26/2021 revealing noncritical CAD.  He also has GERD.  He is complained of fairly constant chest pain for the last 2 weeks that does not sound anginal.  We talked about the importance of lifestyle modification.  He is at goal for secondary prevention.     Carrier Robert Schultz,Robert Schultz,Robert Schultz, [facility] 08/02/2024 8:50 AM

## 2024-08-02 NOTE — Patient Instructions (Signed)
 Medication Instructions:  Your physician recommends that you continue on your current medications as directed. Please refer to the Current Medication list given to you today.  *If you need a refill on your cardiac medications before your next appointment, please call your pharmacy*   Follow-Up: At [facility], you and your health needs are our priority.  As part of our continuing mission to provide you with exceptional heart care, our providers are all part of one team.  This team includes your primary Cardiologist (physician) and Advanced Practice Providers or APPs (Physician Assistants and Nurse Practitioners) who all work together to provide you with the care you need, when you need it.  Your next appointment:   3 month(s)  Provider:   One of our Advanced Practice Providers (APPs): Morse Clause, PA-C  Lamarr Satterfield, NP Miriam Shams, NP  Olivia Pavy, PA-C Josefa Beauvais, NP  Leontine Salen, PA-C Orren Fabry, PA-C  [place], PA-C Ernest Dick, NP  Damien Braver, NP Jon Hails, PA-C  Waddell Donath, PA-C    Dayna Dunn, PA-C  Scott Weaver, PA-C Lum Louis, NP Katlyn West, NP Callie Goodrich, PA-C  Xika Zhao, NP Sheng Haley, PA-C    Kathleen Witzke, PA-C   Then, Dorn Lesches, MD will plan to see you again in 1 year(s).

## 2024-08-02 NOTE — Assessment & Plan Note (Signed)
 Palpitations found to be PVCs on event monitoring with a 2.6% burden.  This has markedly improved with beta-blocker titration.  He attributes this to stress.

## 2024-08-02 NOTE — Assessment & Plan Note (Signed)
 History of essential hypertension with blood pressure measured today at 140/90.  He does have an element of whitecoat hypertension.  He checked his blood pressure at home and it was 132/83.  He is on metoprolol .

## 2024-08-04 ENCOUNTER — Telehealth: Payer: Self-pay | Admitting: Cardiovascular Disease

## 2024-08-04 MED ORDER — METOPROLOL SUCCINATE ER 25 MG PO TB24: 25.0000 mg | ORAL_TABLET | Freq: Every day | ORAL | 1 refills | Status: AC

## 2024-08-04 NOTE — Telephone Encounter (Signed)
 Refill sent.

## 2024-08-04 NOTE — Telephone Encounter (Signed)
*  STAT* If patient is at the pharmacy, call can be transferred to refill team.   1. Which medications need to be refilled? (please list name of each medication and dose if known)   metoprolol  succinate (TOPROL -XL) 25 MG 24 hr tablet     2. Would you like to learn more about the convenience, safety, & potential cost savings by using the [facility]? No    3. Are you open to using the [facility] ([facility].  ). No    4. Which pharmacy/location (including street and city if local pharmacy) is medication to be sent to? [id number] - [place], [place] - [address]     5. Do they need a 30 day or 90 day supply? 90 day    Pt is out of medication

## 2024-08-16 ENCOUNTER — Telehealth: Payer: Self-pay | Admitting: Cardiovascular Disease

## 2024-08-16 DIAGNOSIS — R072 Precordial pain: Secondary | ICD-10-CM

## 2024-08-16 NOTE — Telephone Encounter (Signed)
 Called and spoke to pt. He continues to have Chest Pains. 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 Saw Dr. Court on 08/02/24 who noted pt's CP is r/t GERD. He denies any other associated symptoms. He does take Protonix  and Carafate  prescribed by GI provider. Also, there is a third OTC medication he will begin to take once he picks it up today. He is scheduled for an Endoscopy 08/30/2024.   His BP today was 134/83. He does have PVC's and he feels these (for many years). He is asking if he needs an Echocardiogram or a Stress Test. Advised him that Dr. Court is out of the office until next Monday but we will send his concern to him. ER precautions reviewed with pt who verbalized understanding.

## 2024-08-16 NOTE — Telephone Encounter (Signed)
  Pt c/o of Chest Pain: STAT if active (IN THIS MOMENT) CP, including tightness, pressure, jaw pain, shoulder/upper arm/back pain, SOB, nausea, and vomiting.  1. Are you having CP right now (tightness, pressure, or discomfort)? No   2. Are you experiencing any other symptoms (ex. SOB, nausea, vomiting, sweating)? No   3. How long have you been experiencing CP? 3 weeks   4. Is your CP continuous or coming and going? Coming and going   5. Have you taken Nitroglycerin ? No, don't have it  6. If CP returns before callback, please consider calling 911. ?  The patient said he continues to feel chest discomfort that comes and goes. He does not have nitroglycerin  and would like to know what he can do about it, or if he can be seen this week

## 2024-08-17 MED ORDER — METOPROLOL TARTRATE 100 MG PO TABS: 100.0000 mg | ORAL_TABLET | Freq: Once | ORAL | 0 refills | Status: DC

## 2024-08-17 NOTE — Telephone Encounter (Signed)
 Informed patient that Dr Court suggested a Coronary CTA. Pt agreeable and verbalizes understanding.

## 2024-09-06 ENCOUNTER — Telehealth: Payer: Self-pay | Admitting: Cardiovascular Disease

## 2024-09-06 DIAGNOSIS — R072 Precordial pain: Secondary | ICD-10-CM

## 2024-09-06 MED ORDER — METOPROLOL TARTRATE 100 MG PO TABS: 100.0000 mg | ORAL_TABLET | Freq: Once | ORAL | 0 refills | Status: AC

## 2024-09-06 NOTE — Telephone Encounter (Signed)
°*  STAT* If patient is at the pharmacy, call can be transferred to refill team.   1. Which medications need to be refilled? (please list name of each medication and dose if known) metoprolol  tartrate (LOPRESSOR ) 100 MG tablet  2. Which pharmacy/location (including street and city if local pharmacy) is medication to be sent to? [id number] [place], [place] - [address]  3. Do they need a 30 day or 90 day supply?  1 tablet to take prior to CT.   Patient says he misplaced original prescription.

## 2024-09-06 NOTE — Telephone Encounter (Signed)
 Refill has been sent to the pharmacy.

## 2024-09-07 ENCOUNTER — Telehealth (HOSPITAL_COMMUNITY): Payer: Self-pay | Admitting: *Deleted

## 2024-09-07 NOTE — Telephone Encounter (Signed)
 Reaching out to patient to offer assistance regarding upcoming cardiac imaging study; pt verbalizes understanding of appt date/time, parking situation and where to check in, pre-test NPO status and medications ordered, and verified current allergies; name and call back number provided for further questions should they arise  Larey Brick RN Navigator Cardiac Imaging [facility] [phone] office [phone] cell  Patient to take 100mg  metoprolol tartrate two hours prior to his cardiac CT scan.

## 2024-09-08 ENCOUNTER — Ambulatory Visit (HOSPITAL_COMMUNITY)
Admission: RE | Admit: 2024-09-08 | Discharge: 2024-09-08 | Payer: Self-pay | Attending: Cardiology | Admitting: Cardiology

## 2024-09-08 DIAGNOSIS — R072 Precordial pain: Secondary | ICD-10-CM | POA: Diagnosis present

## 2024-09-08 LAB — POCT I-STAT CREATININE: Creatinine, Ser: 1 mg/dL (ref 0.61–1.24)

## 2024-09-08 MED ORDER — DILTIAZEM HCL 25 MG/5ML IV SOLN: 10.0000 mg | INTRAVENOUS | Status: DC | PRN

## 2024-09-08 MED ORDER — IOHEXOL 350 MG/ML SOLN
100.0000 mL | Freq: Once | INTRAVENOUS | Status: AC | PRN
  Administered 2024-09-08: 08:00:00 100 mL via INTRAVENOUS

## 2024-09-08 MED ORDER — NITROGLYCERIN 0.4 MG SL SUBL
0.8000 mg | SUBLINGUAL_TABLET | Freq: Once | SUBLINGUAL | Status: AC
  Administered 2024-09-08: 08:00:00 0.8 mg via SUBLINGUAL

## 2024-09-08 MED ORDER — METOPROLOL TARTRATE 5 MG/5ML IV SOLN
10.0000 mg | Freq: Once | INTRAVENOUS | Status: AC | PRN
  Administered 2024-09-08: 08:00:00 10 mg via INTRAVENOUS

## 2024-09-09 ENCOUNTER — Ambulatory Visit: Payer: Self-pay | Admitting: Cardiovascular Disease

## 2024-09-09 DIAGNOSIS — R931 Abnormal findings on diagnostic imaging of heart and coronary circulation: Secondary | ICD-10-CM

## 2024-09-10 ENCOUNTER — Other Ambulatory Visit: Payer: Self-pay | Admitting: Surgery

## 2024-09-10 ENCOUNTER — Telehealth (HOSPITAL_BASED_OUTPATIENT_CLINIC_OR_DEPARTMENT_OTHER): Payer: Self-pay | Admitting: *Deleted

## 2024-09-10 DIAGNOSIS — Z01818 Encounter for other preprocedural examination: Secondary | ICD-10-CM

## 2024-09-10 DIAGNOSIS — K8020 Calculus of gallbladder without cholecystitis without obstruction: Secondary | ICD-10-CM

## 2024-09-10 NOTE — Telephone Encounter (Signed)
"  ° °  Pre-operative Risk Assessment    Patient Name: Robert Schultz  DOB: 02-05-1966 MRN: [id number]   Date of last office visit: 08/02/24 DR. BERRY Date of next office visit: 11/03/24 JOSEFA BEAUVAIS, FNP 3 MO F/U    Request for Surgical Clearance    Procedure:  LAP CHOLECYSTECTOMY   Date of Surgery:  Clearance TBD                                Surgeon:  DR. REXINE POLI Surgeon's Group or Practice Name:  [facility] Phone number:  [phone] Fax number:  [phone] DICKEY GUESS, CMA   Type of Clearance Requested:   - Medical  - Pharmacy:  Hold Aspirin      Type of Anesthesia:  General    Additional requests/questions:    [name]   09/10/2024, 3:43 PM   "

## 2024-10-01 ENCOUNTER — Encounter (HOSPITAL_COMMUNITY): Payer: Self-pay

## 2024-10-01 ENCOUNTER — Other Ambulatory Visit (HOSPITAL_COMMUNITY): Payer: Self-pay | Admitting: *Deleted

## 2024-10-01 DIAGNOSIS — I2510 Atherosclerotic heart disease of native coronary artery without angina pectoris: Secondary | ICD-10-CM

## 2024-10-01 DIAGNOSIS — R931 Abnormal findings on diagnostic imaging of heart and coronary circulation: Secondary | ICD-10-CM

## 2024-10-05 ENCOUNTER — Ambulatory Visit (HOSPITAL_COMMUNITY)
Admission: RE | Admit: 2024-10-05 | Discharge: 2024-10-05 | Disposition: A | Discharge status: Home / Self Care | Source: Ambulatory Visit | Attending: Cardiovascular Disease | Admitting: Cardiovascular Disease

## 2024-10-05 ENCOUNTER — Ambulatory Visit: Payer: Self-pay | Admitting: Cardiovascular Disease

## 2024-10-05 DIAGNOSIS — R931 Abnormal findings on diagnostic imaging of heart and coronary circulation: Secondary | ICD-10-CM | POA: Insufficient documentation

## 2024-10-05 LAB — NM PET CT CARDIAC PERFUSION MULTI W/ABSOLUTE BLOODFLOW
MBFR: 2.72
Nuc Rest EF: 65 %
Nuc Stress EF: 72 %
Rest MBF: 0.96 ml/g/min
Rest Nuclear Isotope Dose: 27.5 mCi
ST Depression (mm): 0 mm
Stress MBF: 2.61 ml/g/min
Stress Nuclear Isotope Dose: 27.3 mCi
TID: 1.06

## 2024-10-05 MED ORDER — RUBIDIUM RB82 GENERATOR (RUBYFILL)
27.4600 | PACK | Freq: Once | INTRAVENOUS | Status: AC
  Administered 2024-10-05: 27.46 via INTRAVENOUS

## 2024-10-05 MED ORDER — REGADENOSON 0.4 MG/5ML IV SOLN
0.4000 mg | Freq: Once | INTRAVENOUS | Status: AC
  Administered 2024-10-05: 0.4 mg via INTRAVENOUS

## 2024-10-05 MED ORDER — REGADENOSON 0.4 MG/5ML IV SOLN
INTRAVENOUS | Status: AC
  Filled 2024-10-05: qty 5

## 2024-10-05 MED ORDER — RUBIDIUM RB82 GENERATOR (RUBYFILL)
27.2700 | PACK | Freq: Once | INTRAVENOUS | Status: AC
  Administered 2024-10-05: 27.27 via INTRAVENOUS

## 2024-10-06 NOTE — Telephone Encounter (Signed)
"  ° °  Primary Cardiologist: Dorn Lesches, MD  Chart reviewed as part of pre-operative protocol coverage. Given past medical history and time since last visit, based on ACC/AHA guidelines, Borden C Kupfer would be at acceptable risk for the planned procedure without further cardiovascular testing.   Patient should contact our office if he is having new symptoms that are concerning from a cardiac perspective to arrange a follow-up appointment.    Per office protocol, he may hold aspirin  for 5-7 days prior to procedure and should resume as soon as hemodynamically stable postoperatively.  I will route this recommendation to the requesting party via Epic fax function and remove from pre-op pool.  Please call with questions.  Rosaline EMERSON Bane, NP-C 10/06/2024, 7:17 AM [address], [address] [place], [place] [address] Office [phone] Fax [phone]     "

## 2024-10-13 ENCOUNTER — Telehealth: Payer: Self-pay | Admitting: Cardiovascular Disease

## 2024-10-13 NOTE — Telephone Encounter (Signed)
" °*  STAT* If patient is at the pharmacy, call can be transferred to refill team.   1. Which medications need to be refilled? (please list name of each medication and dose if known) metoprolol  succinate (TOPROL -XL) 25 MG 24 hr tablet   2. Which pharmacy/location (including street and city if local pharmacy) is medication to be sent to? [id number] [place], [place] - [address]   3. Do they need a 30 day or 90 day supply? 90  "

## 2024-10-15 NOTE — Telephone Encounter (Signed)
 Pt following up. Pt is now completely out. Please advise.

## 2024-10-19 NOTE — Telephone Encounter (Signed)
 Left message for pt that [facility] in [facility] 6 month supply in November.

## 2024-11-03 ENCOUNTER — Ambulatory Visit: Payer: Self-pay | Admitting: General Practice
